# Patient Record
Sex: Female | Born: 1955 | Race: White | Hispanic: No | Marital: Married | Smoking: Current some day smoker
Health system: Southern US, Community
[De-identification: ages and names within clinical notes are randomized; demographics above are authoritative.]

## PROBLEM LIST (undated history)

## (undated) DIAGNOSIS — J189 Pneumonia, unspecified organism: Secondary | ICD-10-CM

## (undated) DIAGNOSIS — F329 Major depressive disorder, single episode, unspecified: Secondary | ICD-10-CM

## (undated) DIAGNOSIS — F32A Depression, unspecified: Secondary | ICD-10-CM

## (undated) DIAGNOSIS — I10 Essential (primary) hypertension: Secondary | ICD-10-CM

## (undated) DIAGNOSIS — T8859XA Other complications of anesthesia, initial encounter: Secondary | ICD-10-CM

## (undated) DIAGNOSIS — R112 Nausea with vomiting, unspecified: Secondary | ICD-10-CM

## (undated) DIAGNOSIS — T4145XA Adverse effect of unspecified anesthetic, initial encounter: Secondary | ICD-10-CM

## (undated) DIAGNOSIS — Z9889 Other specified postprocedural states: Secondary | ICD-10-CM

## (undated) DIAGNOSIS — E785 Hyperlipidemia, unspecified: Secondary | ICD-10-CM

## (undated) DIAGNOSIS — M1990 Unspecified osteoarthritis, unspecified site: Secondary | ICD-10-CM

## (undated) DIAGNOSIS — G4700 Insomnia, unspecified: Secondary | ICD-10-CM

## (undated) HISTORY — DX: Essential (primary) hypertension: I10

## (undated) HISTORY — DX: Major depressive disorder, single episode, unspecified: F32.9

## (undated) HISTORY — PX: TONSILLECTOMY: SUR1361

## (undated) HISTORY — DX: Unspecified osteoarthritis, unspecified site: M19.90

## (undated) HISTORY — PX: MYRINGOTOMY: SUR874

## (undated) HISTORY — DX: Hyperlipidemia, unspecified: E78.5

## (undated) HISTORY — DX: Insomnia, unspecified: G47.00

## (undated) HISTORY — DX: Depression, unspecified: F32.A

---

## 2001-08-18 ENCOUNTER — Other Ambulatory Visit: Admission: RE | Admit: 2001-08-18 | Discharge: 2001-08-18 | Payer: Self-pay | Admitting: Internal Medicine

## 2002-07-22 ENCOUNTER — Other Ambulatory Visit: Admission: RE | Admit: 2002-07-22 | Discharge: 2002-07-22 | Payer: Self-pay | Admitting: Internal Medicine

## 2003-08-26 ENCOUNTER — Other Ambulatory Visit: Admission: RE | Admit: 2003-08-26 | Discharge: 2003-08-26 | Payer: Self-pay | Admitting: Internal Medicine

## 2005-04-16 ENCOUNTER — Ambulatory Visit: Payer: Self-pay | Admitting: Internal Medicine

## 2005-04-26 ENCOUNTER — Ambulatory Visit: Payer: Self-pay | Admitting: Internal Medicine

## 2005-05-10 ENCOUNTER — Ambulatory Visit: Payer: Self-pay | Admitting: Internal Medicine

## 2005-09-05 ENCOUNTER — Ambulatory Visit: Payer: Self-pay | Admitting: Internal Medicine

## 2005-10-23 ENCOUNTER — Ambulatory Visit: Payer: Self-pay | Admitting: Internal Medicine

## 2005-12-10 ENCOUNTER — Other Ambulatory Visit: Admission: RE | Admit: 2005-12-10 | Discharge: 2005-12-10 | Payer: Self-pay | Admitting: Internal Medicine

## 2005-12-10 ENCOUNTER — Ambulatory Visit: Payer: Self-pay | Admitting: Internal Medicine

## 2005-12-10 ENCOUNTER — Encounter: Payer: Self-pay | Admitting: Internal Medicine

## 2007-05-05 ENCOUNTER — Ambulatory Visit: Payer: Self-pay | Admitting: Internal Medicine

## 2007-05-05 LAB — CONVERTED CEMR LAB
ALT: 21 units/L (ref 0–35)
AST: 23 units/L (ref 0–37)
Albumin: 4 g/dL (ref 3.5–5.2)
Alkaline Phosphatase: 73 units/L (ref 39–117)
BUN: 9 mg/dL (ref 6–23)
Basophils Absolute: 0 10*3/uL (ref 0.0–0.1)
Calcium: 9.1 mg/dL (ref 8.4–10.5)
Chloride: 105 meq/L (ref 96–112)
Direct LDL: 95.5 mg/dL
Eosinophils Absolute: 0.2 10*3/uL (ref 0.0–0.6)
GFR calc non Af Amer: 113 mL/min
HDL: 36.8 mg/dL — ABNORMAL LOW (ref >39.0)
MCHC: 34.3 g/dL (ref 30.0–36.0)
MCV: 87.4 fL (ref 78.0–100.0)
Platelets: 336 10*3/uL (ref 150–400)
RBC: 4.58 M/uL (ref 3.87–5.11)
Total CHOL/HDL Ratio: 4
Triglycerides: 262 mg/dL (ref 0–149)

## 2007-05-12 ENCOUNTER — Encounter: Payer: Self-pay | Admitting: Internal Medicine

## 2007-05-12 ENCOUNTER — Other Ambulatory Visit: Admission: RE | Admit: 2007-05-12 | Discharge: 2007-05-12 | Payer: Self-pay | Admitting: Internal Medicine

## 2007-05-12 ENCOUNTER — Ambulatory Visit: Payer: Self-pay | Admitting: Internal Medicine

## 2007-05-12 DIAGNOSIS — I10 Essential (primary) hypertension: Secondary | ICD-10-CM | POA: Insufficient documentation

## 2007-05-12 DIAGNOSIS — B009 Herpesviral infection, unspecified: Secondary | ICD-10-CM | POA: Insufficient documentation

## 2007-05-12 DIAGNOSIS — E785 Hyperlipidemia, unspecified: Secondary | ICD-10-CM | POA: Insufficient documentation

## 2007-05-12 DIAGNOSIS — I7389 Other specified peripheral vascular diseases: Secondary | ICD-10-CM | POA: Insufficient documentation

## 2007-05-15 ENCOUNTER — Telehealth: Payer: Self-pay | Admitting: *Deleted

## 2007-05-26 ENCOUNTER — Telehealth: Payer: Self-pay | Admitting: Internal Medicine

## 2007-06-17 ENCOUNTER — Telehealth: Payer: Self-pay | Admitting: *Deleted

## 2007-07-07 ENCOUNTER — Ambulatory Visit: Payer: Self-pay | Admitting: Internal Medicine

## 2007-07-07 LAB — CONVERTED CEMR LAB
HDL goal, serum: 40 mg/dL
LDL Goal: 100 mg/dL

## 2007-07-08 ENCOUNTER — Encounter: Payer: Self-pay | Admitting: Internal Medicine

## 2007-07-08 ENCOUNTER — Ambulatory Visit: Payer: Self-pay | Admitting: Internal Medicine

## 2007-11-03 ENCOUNTER — Ambulatory Visit: Payer: Self-pay | Admitting: Internal Medicine

## 2008-01-09 ENCOUNTER — Ambulatory Visit: Payer: Self-pay | Admitting: Internal Medicine

## 2008-01-09 LAB — CONVERTED CEMR LAB
Alkaline Phosphatase: 75 units/L (ref 39–117)
HDL: 32.3 mg/dL — ABNORMAL LOW (ref >39.0)
Total Bilirubin: 0.7 mg/dL (ref 0.3–1.2)
Total Protein: 6.5 g/dL (ref 6.0–8.3)
Triglycerides: 239 mg/dL (ref 0–149)

## 2008-01-16 ENCOUNTER — Ambulatory Visit: Payer: Self-pay | Admitting: Internal Medicine

## 2008-01-16 ENCOUNTER — Encounter: Payer: Self-pay | Admitting: Internal Medicine

## 2008-01-16 DIAGNOSIS — M1990 Unspecified osteoarthritis, unspecified site: Secondary | ICD-10-CM | POA: Insufficient documentation

## 2008-05-07 ENCOUNTER — Ambulatory Visit: Payer: Self-pay | Admitting: Internal Medicine

## 2008-05-07 LAB — CONVERTED CEMR LAB
ALT: 23 units/L (ref 0–35)
AST: 24 units/L (ref 0–37)
Basophils Relative: 0.9 % (ref 0.0–3.0)
CO2: 25 meq/L (ref 19–32)
Calcium: 8.9 mg/dL (ref 8.4–10.5)
Creatinine, Ser: 0.6 mg/dL (ref 0.4–1.2)
Direct LDL: 95 mg/dL
Eosinophils Relative: 2.6 % (ref 0.0–5.0)
Glucose, Bld: 113 mg/dL — ABNORMAL HIGH (ref 70–99)
Glucose, Urine, Semiquant: NEGATIVE
Hemoglobin: 13.6 g/dL (ref 12.0–15.0)
Lymphocytes Relative: 34.3 % (ref 12.0–46.0)
Monocytes Relative: 5.8 % (ref 3.0–12.0)
Neutro Abs: 4.1 10*3/uL (ref 1.4–7.7)
Protein, U semiquant: NEGATIVE
RBC: 4.54 M/uL (ref 3.87–5.11)
TSH: 2.97 microintl units/mL (ref 0.35–5.50)
Total CHOL/HDL Ratio: 4.1
Total Protein: 6.8 g/dL (ref 6.0–8.3)
Urobilinogen, UA: 0.2
VLDL: 41 mg/dL — ABNORMAL HIGH (ref 0–40)
WBC Urine, dipstick: NEGATIVE
WBC: 7.3 10*3/uL (ref 4.5–10.5)
pH: 7

## 2008-05-14 ENCOUNTER — Other Ambulatory Visit: Admission: RE | Admit: 2008-05-14 | Discharge: 2008-05-14 | Payer: Self-pay | Admitting: Internal Medicine

## 2008-05-14 ENCOUNTER — Encounter: Payer: Self-pay | Admitting: Internal Medicine

## 2008-05-14 ENCOUNTER — Ambulatory Visit: Payer: Self-pay | Admitting: Internal Medicine

## 2008-12-15 ENCOUNTER — Ambulatory Visit: Payer: Self-pay | Admitting: Internal Medicine

## 2008-12-15 ENCOUNTER — Telehealth: Payer: Self-pay | Admitting: Internal Medicine

## 2008-12-15 DIAGNOSIS — M25569 Pain in unspecified knee: Secondary | ICD-10-CM | POA: Insufficient documentation

## 2009-03-04 ENCOUNTER — Encounter: Admission: RE | Admit: 2009-03-04 | Discharge: 2009-03-04 | Payer: Self-pay | Admitting: Orthopedic Surgery

## 2009-05-02 ENCOUNTER — Telehealth: Payer: Self-pay | Admitting: Internal Medicine

## 2009-06-08 ENCOUNTER — Ambulatory Visit: Payer: Self-pay | Admitting: Internal Medicine

## 2009-06-08 LAB — CONVERTED CEMR LAB
ALT: 23 units/L (ref 0–35)
AST: 24 units/L (ref 0–37)
Albumin: 4 g/dL (ref 3.5–5.2)
Alkaline Phosphatase: 72 units/L (ref 39–117)
Basophils Relative: 0.9 % (ref 0.0–3.0)
Bilirubin, Direct: 0.1 mg/dL (ref 0.0–0.3)
CO2: 28 meq/L (ref 19–32)
Calcium: 9 mg/dL (ref 8.4–10.5)
Creatinine, Ser: 0.6 mg/dL (ref 0.4–1.2)
Eosinophils Relative: 2.4 % (ref 0.0–5.0)
HDL: 39.7 mg/dL (ref >39.00)
Hemoglobin: 13.6 g/dL (ref 12.0–15.0)
LDL Cholesterol: 91 mg/dL (ref 0–99)
Lymphocytes Relative: 36.5 % (ref 12.0–46.0)
MCHC: 33.4 g/dL (ref 30.0–36.0)
Monocytes Relative: 4.9 % (ref 3.0–12.0)
Neutro Abs: 3.8 10*3/uL (ref 1.4–7.7)
Neutrophils Relative %: 55.3 % (ref 43.0–77.0)
Nitrite: NEGATIVE
RBC: 4.6 M/uL (ref 3.87–5.11)
Sodium: 145 meq/L (ref 135–145)
Specific Gravity, Urine: 1.02
Total CHOL/HDL Ratio: 4
Total Protein: 7.2 g/dL (ref 6.0–8.3)
Urobilinogen, UA: 0.2
WBC Urine, dipstick: NEGATIVE
WBC: 7 10*3/uL (ref 4.5–10.5)

## 2009-06-15 ENCOUNTER — Ambulatory Visit: Payer: Self-pay | Admitting: Internal Medicine

## 2009-06-15 ENCOUNTER — Other Ambulatory Visit: Admission: RE | Admit: 2009-06-15 | Discharge: 2009-06-15 | Payer: Self-pay | Admitting: Internal Medicine

## 2009-06-15 ENCOUNTER — Encounter: Payer: Self-pay | Admitting: Internal Medicine

## 2009-08-05 ENCOUNTER — Encounter: Payer: Self-pay | Admitting: Internal Medicine

## 2009-08-05 ENCOUNTER — Ambulatory Visit: Payer: Self-pay | Admitting: Internal Medicine

## 2009-08-12 ENCOUNTER — Ambulatory Visit: Payer: Self-pay | Admitting: Internal Medicine

## 2009-10-27 ENCOUNTER — Ambulatory Visit: Payer: Self-pay | Admitting: Internal Medicine

## 2009-10-28 ENCOUNTER — Telehealth: Payer: Self-pay | Admitting: Internal Medicine

## 2009-10-31 ENCOUNTER — Telehealth: Payer: Self-pay | Admitting: Internal Medicine

## 2009-11-15 ENCOUNTER — Ambulatory Visit: Payer: Self-pay | Admitting: Internal Medicine

## 2009-11-15 DIAGNOSIS — M1710 Unilateral primary osteoarthritis, unspecified knee: Secondary | ICD-10-CM | POA: Insufficient documentation

## 2009-11-16 ENCOUNTER — Telehealth: Payer: Self-pay | Admitting: Internal Medicine

## 2009-12-14 ENCOUNTER — Ambulatory Visit: Payer: Self-pay | Admitting: Internal Medicine

## 2009-12-21 ENCOUNTER — Ambulatory Visit: Payer: Self-pay | Admitting: Internal Medicine

## 2009-12-28 ENCOUNTER — Ambulatory Visit: Payer: Self-pay | Admitting: Internal Medicine

## 2010-03-22 ENCOUNTER — Telehealth: Payer: Self-pay | Admitting: Internal Medicine

## 2010-11-23 NOTE — Assessment & Plan Note (Signed)
Summary: 3rd synvisc injection/bmw   Vital Signs:  Patient profile:   55 year old female Temp:     98.2 degrees F oral Pulse rate:   72 / minute BP sitting:   130 / 80  (left arm)  Vitals Entered By: Willy Eddy, LPN (December 28, 452 4:11 PM) CC: last synvisc injection lot t1004/exp 02-2012   CC:  last synvisc injection lot t1004/exp 02-2012.  History of Present Illness: presents for thrid shot, has noted improvement  Preventive Screening-Counseling & Management  Alcohol-Tobacco     Smoking Status: quit     Passive Smoke Exposure: no  Current Medications (verified): 1)  Simvastatin 20 Mg Tabs (Simvastatin) .... One By Mouth Daily 2)  Vitamin C .... Take 1 Tablet By Mouth Once A Day 3)  Remifemin 20 Mg  Tabs (Black Cohosh) .Marland Kitchen.. 1 Twice A Day 4)  Co Q-10 Vitamin E Fish Oil 60-90-25-200  Caps (Dha-Epa-Coenzyme Q10-Vitamin E) 5)  Fish Oil Concentrate 1000 Mg  Caps (Omega-3 Fatty Acids) .... Two By Mouth Bid 6)  Floxin Otic 0.3 %  Soln (Ofloxacin) .... 4qtts in Right Ear Bid 7)  Valacyclovir Hcl 500 Mg Tabs (Valacyclovir Hcl) .Marland Kitchen.. 1 Once Daily As Needed 8)  Tramadol Hcl 50 Mg Tabs (Tramadol Hcl) .... One To Two By Mouth Q 6 Hours As Needed Pain 9)  Vitamin B-6 100 Mg Tabs (Pyridoxine Hcl) .Marland Kitchen.. 1 Once Daily 10)  Folic Acid 1 Mg Tabs (Folic Acid) .Marland Kitchen.. 1 Once Daily 11)  Sulfamethoxazole-Trimethoprim 800-160 Mg/65ml Susp (Sulfamethoxazole-Trimethoprim) .... One By Mouth Two Times A Day 12)  Diclofenac Sodium 75 Mg Tbec (Diclofenac Sodium) .... One By Mouth Bid  Allergies (verified): 1)  ! Augmentin   Impression & Recommendations:  Problem # 1:  LOC OSTEOARTHROS NOT SPEC PRIM/SEC LOWER LEG (ICD-715.36) right knee pt was prepped in a sterile manor and 2cc of synvisc was injected into the knee.The pt tolerated the procedure well and was given post procedure instructions.  Her updated medication list for this problem includes:    Tramadol Hcl 50 Mg Tabs (Tramadol hcl) .....  One to two by mouth q 6 hours as needed pain    Diclofenac Sodium 75 Mg Tbec (Diclofenac sodium) ..... One by mouth bid  Discussed use of medications, application of heat or cold, and exercises.   Orders: No Charge Patient Arrived (NCPA0) (NCPA0) Joint Aspirate / Injection, Large (20610) Synvisc injection, 2 ml (U9811)  Complete Medication List: 1)  Simvastatin 20 Mg Tabs (Simvastatin) .... One by mouth daily 2)  Vitamin C  .... Take 1 tablet by mouth once a day 3)  Remifemin 20 Mg Tabs (Black cohosh) .Marland Kitchen.. 1 twice a day 4)  Co Q-10 Vitamin E Fish Oil 60-90-25-200 Caps (Dha-epa-coenzyme q10-vitamin e) 5)  Fish Oil Concentrate 1000 Mg Caps (Omega-3 fatty acids) .... Two by mouth bid 6)  Floxin Otic 0.3 % Soln (Ofloxacin) .... 4qtts in right ear bid 7)  Valacyclovir Hcl 500 Mg Tabs (Valacyclovir hcl) .Marland Kitchen.. 1 once daily as needed 8)  Tramadol Hcl 50 Mg Tabs (Tramadol hcl) .... One to two by mouth q 6 hours as needed pain 9)  Vitamin B-6 100 Mg Tabs (Pyridoxine hcl) .Marland Kitchen.. 1 once daily 10)  Folic Acid 1 Mg Tabs (Folic acid) .Marland Kitchen.. 1 once daily 11)  Sulfamethoxazole-trimethoprim 800-160 Mg/52ml Susp (Sulfamethoxazole-trimethoprim) .... One by mouth two times a day 12)  Diclofenac Sodium 75 Mg Tbec (Diclofenac sodium) .... One by mouth bid

## 2010-11-23 NOTE — Progress Notes (Signed)
Summary: Pt called to update Dr. Lovell Sheehan on pain  Phone Note Call from Patient Call back at Home Phone [phone]   Caller: Patient Summary of Call: Pt called and wanted to let Dr. Lovell Sheehan that the cortisone shot she was given yesterday, seems to be working. Pt feeling some pain but not as bad. Pain lvl is now a 2.   Initial call taken by: Lucy Antigua,  November 16, 2009 2:56 PM  Follow-up for Phone Call        dr Lovell Sheehan is aware Follow-up by: Willy Eddy, LPN,  November 16, 2009 3:12 PM

## 2010-11-23 NOTE — Progress Notes (Signed)
Summary: diclofenac refill  Phone Note Refill Request Message from:  Fax from Pharmacy on March 22, 2010 3:26 PM  Refills Requested: Medication #1:  DICLOFENAC SODIUM 75 MG TBEC one by mouth bid. Initial call taken by: Kern Reap CMA Duncan Dull),  March 22, 2010 3:26 PM    Prescriptions: DICLOFENAC SODIUM 75 MG TBEC (DICLOFENAC SODIUM) one by mouth bid  #60 Tablet x 0   Entered by:   Kern Reap CMA (AAMA)   Authorized by:   Stacie Glaze MD   Signed by:   Kern Reap CMA (AAMA) on 03/22/2010   Method used:   Electronically to        [facility] (retail)       [address]       [place], [place]  [address]       Ph: [phone] or [phone]       Fax: [phone]   RxID:   [id number]

## 2010-11-23 NOTE — Assessment & Plan Note (Signed)
Summary: consult re: birth mark/notices changes/cjr/husband rescd from...   Vital Signs:  Patient profile:   55 year old female Height:      63 inches Weight:      238 pounds BMI:     42.31 Temp:     98.2 degrees F oral Pulse rate:   84 / minute Resp:     14 per minute BP sitting:   130 / 84  (left arm) Cuff size:   large  Vitals Entered By: Willy Eddy, LPN (October 27, 2009 1:50 PM) CC: c/o cough and congestion- , Hypertension Management, Cough, Lipid Management   CC:  c/o cough and congestion- , Hypertension Management, Cough, and Lipid Management.  History of Present Illness:  Cough      This is a 55 year old woman who presents with Cough.  The patient reports productive cough and wheezing, but denies non-productive cough, pleuritic chest pain, shortness of breath, exertional dyspnea, fever, hemoptysis, and malaise.  The patient denies the following symptoms: cold/URI symptoms, sore throat, nasal congestion, chronic rhinitis, weight loss, acid reflux symptoms, and peripheral edema.  The cough is worse with cold exposure.  Risk factors include recurrent sinus infections.  the cough is productive of green sputum. she has mild pluretic chest pain.   Hypertension History:      She denies headache, chest pain, palpitations, dyspnea with exertion, orthopnea, PND, peripheral edema, visual symptoms, neurologic problems, syncope, and side effects from treatment.        Positive major cardiovascular risk factors include hyperlipidemia and hypertension.  Negative major cardiovascular risk factors include female age less than 30 years old, negative family history for ischemic heart disease, and non-tobacco-user status.        Positive history for target organ damage include peripheral vascular disease.  Further assessment for target organ damage reveals no history of ASHD or stroke/TIA.    Lipid Management History:      Positive NCEP/ATP III risk factors include early menopause without  estrogen hormone replacement, HDL cholesterol less than 40, hypertension, and peripheral vascular disease.  Negative NCEP/ATP III risk factors include female age less than 45 years old, no family history for ischemic heart disease, non-tobacco-user status, no ASHD (atherosclerotic heart disease), no prior stroke/TIA, and no history of aortic aneurysm.     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 One By Mouth Daily 2)  Vitamin C .... Take 1 Tablet By Mouth Once A Day 3)  Remifemin 20 Mg  Tabs (Black Cohosh) .Marland Kitchen.. 1 Twice A Day 4)  Co Q-10 Vitamin E Fish Oil 60-90-25-200  Caps (Dha-Epa-Coenzyme Q10-Vitamin E) 5)  Fish Oil Concentrate 1000 Mg  Caps (Omega-3 Fatty Acids) .... Two By Mouth Bid 6)  Floxin Otic 0.3 %  Soln (Ofloxacin) .... 4qtts in Right Ear Bid 7)  Valacyclovir Hcl 500 Mg Tabs (Valacyclovir Hcl) .Marland Kitchen.. 1 Once Daily As Needed 8)  Meloxicam 15 Mg Tabs (  Meloxicam) .... One By Mouth Daily 9)  Tramadol Hcl 50 Mg Tabs (Tramadol Hcl) .... One To Two By Mouth Q 6 Hours As Needed Pain 10)  Vitamin B-6 100 Mg Tabs (Pyridoxine Hcl) .Marland Kitchen.. 1 Once Daily 11)  Folic Acid 1 Mg Tabs (Folic Acid) .Marland Kitchen.. 1 Once Daily  Current Medications (verified): 1)  Simvastatin 20 Mg Tabs (Simvastatin) .... One By Mouth Daily 2)  Vitamin C .... Take 1 Tablet By Mouth Once A Day 3)  Remifemin 20 Mg  Tabs (Black Cohosh) .Marland Kitchen.. 1 Twice A Day 4)  Co Q-10 Vitamin E Fish Oil 60-90-25-200   Caps (Dha-Epa-Coenzyme Q10-Vitamin E) 5)  Fish Oil Concentrate 1000 Mg  Caps (Omega-3 Fatty Acids) .... Two By Mouth Bid 6)  Floxin Otic 0.3 %  Soln (Ofloxacin) .... 4qtts in Right Ear Bid 7)  Valacyclovir Hcl 500 Mg Tabs (Valacyclovir Hcl) .Marland Kitchen.. 1 Once Daily As Needed 8)  Meloxicam 15 Mg Tabs (Meloxicam) .... One By Mouth Daily 9)  Tramadol Hcl 50 Mg Tabs (Tramadol Hcl) .... One To Two By Mouth Q 6 Hours As Needed Pain 10)  Vitamin B-6 100 Mg Tabs (Pyridoxine Hcl) .Marland Kitchen.. 1 Once Daily 11)  Folic Acid 1 Mg Tabs (Folic Acid) .Marland Kitchen.. 1 Once Daily 12)  Robafen Ac 100-10 Mg/27ml Syrp (Guaifenesin-Codeine) .... Two Tps By Mouth Q 6 Hours Prn  Allergies (verified): 1)  ! Augmentin  Past History:  Family History: Last updated: 05/12/2007 uncl with Altzhimers Dz  Social History: Last updated: 05/12/2007 Occupation: unemployed Married Former Smoker quit 2004  Risk Factors: Smoking Status: quit (10/27/2009) Passive Smoke Exposure: no (10/27/2009)  Past medical, surgical, family and social histories (including risk factors) reviewed, and no changes noted (except as noted below).  Past Medical History: Reviewed history from 01/16/2008 and no changes required. Hyperlipidemia Hypertension Osteoarthritis  Past Surgical History: Reviewed history from 05/12/2007 and no changes required. Tonsillectomy Miringotomy  Family History: Reviewed history from 05/12/2007 and no changes required. uncl with Altzhimers Dz  Social History: Reviewed history from 05/12/2007 and no changes required. Occupation: unemployed Married Former Smoker quit 2004  Review of Systems       The patient complains of decreased hearing, hoarseness, prolonged cough, and headaches.  The patient denies anorexia, fever, weight loss, weight gain, vision loss, chest pain, syncope, dyspnea on exertion, peripheral edema, hemoptysis, abdominal pain, melena, hematochezia, severe indigestion/heartburn, hematuria, incontinence,  genital sores, muscle weakness, suspicious skin lesions, transient blindness, difficulty walking, depression, unusual weight change, abnormal bleeding, enlarged lymph nodes, angioedema, and breast masses.    Physical Exam  General:  alert.  overweight-appearing.   Head:  normocephalic.  atraumatic.   Ears:  R Canal drainage, R TM erythema, R TM bulging, and R TM perforated.   Nose:  no external deformity and no airflow obstruction.   Mouth:  good dentition and pharynx pink and moist.   Neck:  No deformities, masses, or tenderness noted. Lungs:  Normal respiratory effort, chest expands symmetrically. Lungs are clear to auscultation, no crackles or wheezes. Heart:  normal rate and regular rhythm.   Abdomen:  soft and non-tender.   Genitalia:  normal introitus, no vaginal atrophy, normal uterus size and position, and no adnexal masses or tenderness.   Msk:  decreased ROM and joint tenderness.   Pulses:  R and L carotid,radial,femoral,dorsalis pedis and posterior tibial pulses are full and equal bilaterally Extremities:  trace left pedal edema and trace right pedal edema.   Neurologic:  cranial nerves II-XII intact  and gait normal.     Impression & Recommendations:  Problem # 1:  ACUTE BRONCHITIS (ICD-466.0) Assessment Unchanged  Her updated medication list for this problem includes:    Robafen Ac 100-10 Mg/50ml Syrp (Guaifenesin-codeine) .Marland Kitchen..Marland Kitchen Two tps by mouth q 6 hours prn    Sulfamethoxazole-trimethoprim 800-160 Mg/86ml Susp (Sulfamethoxazole-trimethoprim) ..... One by mouth two times a day  Take antibiotics and other medications as directed. Encouraged to push clear liquids, get enough rest, and take acetaminophen as needed. To be seen in 5-7 days if no improvement, sooner if worse.  Problem # 2:  KNEE PAIN, LEFT (ICD-719.46) Assessment: Unchanged  Her updated medication list for this problem includes:    Celebrex 200 Mg Caps (Celecoxib) ..... One by mouth two times a day     Tramadol Hcl 50 Mg Tabs (Tramadol hcl) ..... One to two by mouth q 6 hours as needed pain  Discussed strengthening exercises, use of ice or heat, and medications.   Complete Medication List: 1)  Simvastatin 20 Mg Tabs (Simvastatin) .... One by mouth daily 2)  Vitamin C  .... Take 1 tablet by mouth once a day 3)  Remifemin 20 Mg Tabs (Black cohosh) .Marland Kitchen.. 1 twice a day 4)  Co Q-10 Vitamin E Fish Oil 60-90-25-200 Caps (Dha-epa-coenzyme q10-vitamin e) 5)  Fish Oil Concentrate 1000 Mg Caps (Omega-3 fatty acids) .... Two by mouth bid 6)  Floxin Otic 0.3 % Soln (Ofloxacin) .... 4qtts in right ear bid 7)  Valacyclovir Hcl 500 Mg Tabs (Valacyclovir hcl) .Marland Kitchen.. 1 once daily as needed 8)  Celebrex 200 Mg Caps (Celecoxib) .... One by mouth two times a day 9)  Tramadol Hcl 50 Mg Tabs (Tramadol hcl) .... One to two by mouth q 6 hours as needed pain 10)  Vitamin B-6 100 Mg Tabs (Pyridoxine hcl) .Marland Kitchen.. 1 once daily 11)  Folic Acid 1 Mg Tabs (Folic acid) .Marland Kitchen.. 1 once daily 12)  Robafen Ac 100-10 Mg/35ml Syrp (Guaifenesin-codeine) .... Two tps by mouth q 6 hours prn 13)  Sulfamethoxazole-trimethoprim 800-160 Mg/100ml Susp (Sulfamethoxazole-trimethoprim) .... One by mouth two times a day  Hypertension Assessment/Plan:      The patient's hypertensive risk group is category C: Target organ damage and/or diabetes.  Her calculated 10 year risk of coronary heart disease is 7 %.  Today's blood pressure is 130/84.  Her blood pressure goal is < 140/90.  Lipid Assessment/Plan:      Based on NCEP/ATP III, the patient's risk factor category is "history of coronary disease, peripheral vascular disease, cerebrovascular disease, or aortic aneurysm".  The patient's lipid goals are as follows: Total cholesterol goal is 200; LDL cholesterol goal is 100; HDL cholesterol goal is 40; Triglyceride goal is 150.  Her LDL cholesterol goal has been met.     Patient Instructions: 1)  Please schedule a follow-up appointment in 3  months. Prescriptions: CELEBREX 200 MG CAPS (CELECOXIB) one by mouth two times a day  #60 x 3   Entered and Authorized by:   Stacie Glaze MD   Signed by:   Stacie Glaze MD on 10/27/2009   Method used:   Electronically to        [facility] (retail)       [address]       [place], [place]  [address]       Ph: [phone] or [phone]       Fax: [phone]  RxID:   [id number] SULFAMETHOXAZOLE-TRIMETHOPRIM 800-160 MG/20ML SUSP (SULFAMETHOXAZOLE-TRIMETHOPRIM) one by mouth two times a day  #20 x 0   Entered and Authorized by:   Stacie Glaze MD   Signed by:   Stacie Glaze MD on 10/27/2009   Method used:   Electronically to        [facility] (retail)       [address]       [place], [place]  [address]       Ph: [phone] or [phone]       Fax: [phone]   RxID:   [id number] SIMVASTATIN 20 MG TABS (SIMVASTATIN) one by mouth daily  #90 x 3   Entered and Authorized by:   Stacie Glaze MD   Signed by:   Stacie Glaze MD on 10/27/2009   Method used:   Print then Give to Patient   RxID:   [id number] ROBAFEN AC 100-10 MG/5ML SYRP (GUAIFENESIN-CODEINE) two tps by mouth q 6 hours prn  #6 oz x 1   Entered and Authorized by:   Stacie Glaze MD   Signed by:   Stacie Glaze MD on 10/27/2009   Method used:   Print then Give to Patient   RxID:   [id number] ZOCOR 20 MG  TABS (SIMVASTATIN) one by mouth daily  #90 x 3   Entered by:   Willy Eddy, LPN   Authorized by:   Stacie Glaze MD   Signed by:   Willy Eddy, LPN on 54/27/0623   Method used:   Electronically to        [facility] (retail)       [address]       [place], [place]  [address]       Ph: [phone] or [phone]       Fax: [phone]   RxID:   [id number]

## 2010-11-23 NOTE — Progress Notes (Signed)
Summary: wants alternative  Phone Note Call from Patient Call back at Work Phone [phone]   Caller: Patient-live call Summary of Call: Ins co will not cover Celebrex. Could Volteran be called in to [facility] at [facility]? Wants bonnye to return her call. Initial call taken by: Warnell Forester,  October 31, 2009 9:10 AM  Follow-up for Phone Call        per dr Lovell Sheehan- ok to change to voltaren 75 #60 1 two times a day 1 refill Follow-up by: Willy Eddy, LPN,  October 31, 2009 10:26 AM    New/Updated Medications: VOLTAREN 0.1 % SOLN (DICLOFENAC SODIUM)  DICLOFENAC SODIUM 75 MG TBEC (DICLOFENAC SODIUM) one by mouth bid Prescriptions: DICLOFENAC SODIUM 75 MG TBEC (DICLOFENAC SODIUM) one by mouth bid  #60 x 0   Entered by:   Lynann Beaver CMA   Authorized by:   Stacie Glaze MD   Signed by:   Lynann Beaver CMA on 10/31/2009   Method used:   Electronically to        [facility] (retail)       [address]       [place], [place]  [address]       Ph: [phone] or [phone]       Fax: [phone]   RxID:   [id number]

## 2010-11-23 NOTE — Assessment & Plan Note (Signed)
Summary: R KNEE PAIN // RS   Vital Signs:  Patient profile:   55 year old female Height:      63 inches Weight:      238 pounds BMI:     42.31 Temp:     98.2 degrees F oral Pulse rate:   80 / minute Resp:     14 per minute BP sitting:   130 / 80  (left arm) Cuff size:   large  Vitals Entered By: Willy Eddy, LPN (November 15, 2009 3:04 PM) CC: rt knee pain   CC:  rt knee pain.  History of Present Illness: Knee pain with meniscal  injury knee pain for 6 months rated up tp 9/10 dull chronic pain with swelling right knee no injury history options of care discussed  Preventive Screening-Counseling & Management  Alcohol-Tobacco     Smoking Status: quit     Passive Smoke Exposure: no  Allergies: 1)  ! Augmentin  Physical Exam  Msk:  right knee withdecreased ROM, joint tenderness, and joint swelling.  and effusion Pulses:  R and L carotid,radial,femoral,dorsalis pedis and posterior tibial pulses are full and equal bilaterally Extremities:  trace left pedal edema and 1+ right pedal edema.     Impression & Recommendations:  Problem # 1:  LOC OSTEOARTHROS NOT SPEC PRIM/SEC LOWER LEG (ICD-715.36) ritghtr knee Informed consent obtained and then the joint was prepped in a sterile manor and 40 mg depo and 1/2 cc 1% lidocaine injected into the synovial space. After care discussed. Pt tolerated procedure well.  The following medications were removed from the medication list:    Celebrex 200 Mg Caps (Celecoxib) ..... One by mouth two times a day Her updated medication list for this problem includes:    Tramadol Hcl 50 Mg Tabs (Tramadol hcl) ..... One to two by mouth q 6 hours as needed pain    Diclofenac Sodium 75 Mg Tbec (Diclofenac sodium) ..... One by mouth bid  Discussed use of medications, application of heat or cold, and exercises.   Orders: No Charge Patient Arrived (NCPA0) (NCPA0) Joint Aspirate / Injection, Large (20610) Depo- Medrol 40mg  (J1030)  Complete  Medication List: 1)  Simvastatin 20 Mg Tabs (Simvastatin) .... One by mouth daily 2)  Vitamin C  .... Take 1 tablet by mouth once a day 3)  Remifemin 20 Mg Tabs (Black cohosh) .Marland Kitchen.. 1 twice a day 4)  Co Q-10 Vitamin E Fish Oil 60-90-25-200 Caps (Dha-epa-coenzyme q10-vitamin e) 5)  Fish Oil Concentrate 1000 Mg Caps (Omega-3 fatty acids) .... Two by mouth bid 6)  Floxin Otic 0.3 % Soln (Ofloxacin) .... 4qtts in right ear bid 7)  Valacyclovir Hcl 500 Mg Tabs (Valacyclovir hcl) .Marland Kitchen.. 1 once daily as needed 8)  Tramadol Hcl 50 Mg Tabs (Tramadol hcl) .... One to two by mouth q 6 hours as needed pain 9)  Vitamin B-6 100 Mg Tabs (Pyridoxine hcl) .Marland Kitchen.. 1 once daily 10)  Folic Acid 1 Mg Tabs (Folic acid) .Marland Kitchen.. 1 once daily 11)  Sulfamethoxazole-trimethoprim 800-160 Mg/1ml Susp (Sulfamethoxazole-trimethoprim) .... One by mouth two times a day 12)  Diclofenac Sodium 75 Mg Tbec (Diclofenac sodium) .... One by mouth bid

## 2010-11-23 NOTE — Progress Notes (Signed)
Summary: Pt has questions re: Celebrex. Insurance wont cover med  Phone Note Call from Patient Call back at Work Phone [phone]   Caller: Patient Reason for Call: Acute Illness Summary of Call: Pt has questions regarding Celebrex that was just prescribed. Pts insurance will not cover med and pharmacy will not fill. Please call pt before 3pm today.  Initial call taken by: Lucy Antigua,  October 28, 2009 10:37 AM  Follow-up for Phone Call        please let pt know the pharmacy needs to send [place] a prioir authroiazation form Follow-up by: Willy Eddy, LPN,  October 28, 2009 10:45 AM  Additional Follow-up for Phone Call Additional follow up Details #1::        Phone Call Completed Additional Follow-up by: Rudy Jew, RN,  October 28, 2009 10:54 AM

## 2010-11-23 NOTE — Assessment & Plan Note (Signed)
Summary: synvisc injection/bmw   Vital Signs:  Patient profile:   55 year old female Pulse rate:   80 / minute BP sitting:   130 / 80  (left arm) CC: 2nd synvisc injection lot t1004-exp 02/2012   CC:  2nd synvisc injection lot t1004-exp 02/2012.  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 One By Mouth Daily 2)  Vitamin C .... Take 1 Tablet By Mouth Once A Day 3)  Remifemin 20 Mg  Tabs (Black Cohosh) .Marland Kitchen.. 1 Twice A Day 4)  Co Q-10 Vitamin E Fish Oil 60-90-25-200  Caps (Dha-Epa-Coenzyme Q10-Vitamin E) 5)  Fish Oil Concentrate 1000 Mg  Caps (Omega-3 Fatty Acids) .... Two By Mouth Bid 6)  Floxin Otic 0.3 %  Soln (Ofloxacin) .... 4qtts in Right Ear Bid 7)  Valacyclovir Hcl 500 Mg Tabs (Valacyclovir Hcl) .Marland Kitchen.. 1 Once Daily As Needed 8)  Tramadol Hcl 50 Mg Tabs (Tramadol Hcl) .... One To Two By Mouth Q 6 Hours As Needed Pain 9)  Vitamin B-6 100 Mg Tabs (Pyridoxine Hcl) .Marland Kitchen.. 1 Once Daily 10)  Folic Acid 1 Mg Tabs (Folic Acid) .Marland Kitchen.. 1 Once Daily 11)  Sulfamethoxazole-Trimethoprim 800-160 Mg/61ml Susp (Sulfamethoxazole-Trimethoprim) .... One By Mouth Two Times A Day 12)  Diclofenac Sodium 75 Mg Tbec (Diclofenac Sodium) .... One By Mouth Bid  Allergies: 1)  !  Augmentin   Impression & Recommendations:  Problem # 1:  LOC OSTEOARTHROS NOT SPEC PRIM/SEC LOWER LEG (ICD-715.36)  pt was prepped in a sterile manor and 2cc of synvisc was injected into the knee.The pt tolerated the procedure well and was given post procedure instructions. right knee Her updated medication list for this problem includes:    Tramadol Hcl 50 Mg Tabs (Tramadol hcl) ..... One to two by mouth q 6 hours as needed pain    Diclofenac Sodium 75 Mg Tbec (Diclofenac sodium) ..... One by mouth bid  Discussed use of medications, application of heat or cold, and exercises.   Orders: No Charge Patient Arrived (NCPA0) (NCPA0) Depo- Medrol 40mg  (J1030) Synvisc injection, 2 ml (N5621)  Complete Medication List: 1)  Simvastatin 20 Mg Tabs (Simvastatin) .... One by mouth daily 2)  Vitamin C  .... Take 1 tablet by mouth once a day 3)  Remifemin 20 Mg Tabs (Black cohosh) .Marland Kitchen.. 1 twice a day 4)  Co Q-10 Vitamin E Fish Oil 60-90-25-200 Caps (Dha-epa-coenzyme q10-vitamin e) 5)  Fish Oil Concentrate 1000 Mg Caps (Omega-3 fatty acids) .... Two by mouth bid 6)  Floxin Otic 0.3 % Soln (Ofloxacin) .... 4qtts in right ear bid 7)  Valacyclovir Hcl 500 Mg Tabs (Valacyclovir hcl) .Marland Kitchen.. 1 once daily as needed 8)  Tramadol Hcl 50 Mg Tabs (Tramadol hcl) .... One to two by mouth q 6 hours as  needed pain 9)  Vitamin B-6 100 Mg Tabs (Pyridoxine hcl) .Marland Kitchen.. 1 once daily 10)  Folic Acid 1 Mg Tabs (Folic acid) .Marland Kitchen.. 1 once daily 11)  Sulfamethoxazole-trimethoprim 800-160 Mg/10ml Susp (Sulfamethoxazole-trimethoprim) .... One by mouth two times a day 12)  Diclofenac Sodium 75 Mg Tbec (Diclofenac sodium) .... One by mouth bid

## 2010-11-23 NOTE — Assessment & Plan Note (Signed)
Summary: CONSULT RE: KNEE PAIN/REQ INJ IN KNEE/CJR   Vital Signs:  Patient profile:   55 year old female Height:      63 inches Weight:      238 pounds BMI:     42.31 Temp:     98.2 degrees F oral Pulse rate:   80 / minute Resp:     14 per minute  Vitals Entered By: Willy Eddy, LPN (December 14, 2009 3:28 PM) CC: c/o rt knee pain-synvisc lot q1011  exp 3-13   CC:  c/o rt knee pain-synvisc lot q1011  exp 3-13.  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) 10)  Hypertension  (ICD-401.9) 11)  Hyperlipidemia  (ICD-272.4)  Current Problems (verified): 1)  Loc Osteoarthros Not Spec Prim/sec Lower Leg  (ICD-715.36) 2)  Acute Bronchitis  (ICD-466.0) 3)  Otitis Media, Right, With Rupture of Tympanic Membrane  (ICD-382.01) 4)  Knee Pain, Left  (ICD-719.46) 5)  Osteoarthritis  (ICD-715.90) 6)  Health Maintenance Exam  (ICD-V70.0) 7)  Otitis Media Nos  (ICD-382.9) 8)  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 One By Mouth Daily 2)  Vitamin C .... Take 1 Tablet By Mouth Once A Day 3)  Remifemin 20 Mg  Tabs (Black Cohosh) .Marland Kitchen.. 1 Twice A Day 4)  Co Q-10 Vitamin E Fish Oil 60-90-25-200  Caps (Dha-Epa-Coenzyme Q10-Vitamin E) 5)  Fish Oil  Concentrate 1000 Mg  Caps (Omega-3 Fatty Acids) .... Two By Mouth Bid 6)  Floxin Otic 0.3 %  Soln (Ofloxacin) .... 4qtts in Right Ear Bid 7)  Valacyclovir Hcl 500 Mg Tabs (Valacyclovir Hcl) .Marland Kitchen.. 1 Once Daily As Needed 8)  Tramadol Hcl 50 Mg Tabs (Tramadol Hcl) .... One To Two By Mouth Q 6 Hours As Needed Pain 9)  Vitamin B-6 100 Mg Tabs (Pyridoxine Hcl) .Marland Kitchen.. 1 Once Daily 10)  Folic Acid 1 Mg Tabs (Folic Acid) .Marland Kitchen.. 1 Once Daily 11)  Sulfamethoxazole-Trimethoprim 800-160 Mg/56ml Susp (Sulfamethoxazole-Trimethoprim) .... One By Mouth Two Times A Day 12)  Diclofenac Sodium 75 Mg Tbec (Diclofenac Sodium) .... One By Mouth Bid  Current Medications (verified): 1)  Simvastatin 20 Mg Tabs (Simvastatin) .... One By Mouth Daily 2)  Vitamin C .... Take 1 Tablet By Mouth Once A Day 3)  Remifemin 20 Mg  Tabs (Black Cohosh) .Marland Kitchen.. 1 Twice A Day 4)  Co Q-10 Vitamin E Fish Oil 60-90-25-200  Caps (Dha-Epa-Coenzyme Q10-Vitamin E) 5)  Fish Oil Concentrate 1000 Mg  Caps (Omega-3 Fatty Acids) .... Two By Mouth Bid 6)  Floxin Otic 0.3 %  Soln (Ofloxacin) .... 4qtts in Right Ear Bid 7)  Valacyclovir Hcl 500 Mg Tabs (Valacyclovir Hcl) .Marland Kitchen.. 1 Once Daily As Needed 8)  Tramadol Hcl 50 Mg Tabs (Tramadol Hcl) .Marland KitchenMarland KitchenMarland Kitchen  One To Two By Mouth Q 6 Hours As Needed Pain 9)  Vitamin B-6 100 Mg Tabs (Pyridoxine Hcl) .Marland Kitchen.. 1 Once Daily 10)  Folic Acid 1 Mg Tabs (Folic Acid) .Marland Kitchen.. 1 Once Daily 11)  Sulfamethoxazole-Trimethoprim 800-160 Mg/23ml Susp (Sulfamethoxazole-Trimethoprim) .... One By Mouth Two Times A Day 12)  Diclofenac Sodium 75 Mg Tbec (Diclofenac Sodium) .... One By Mouth Bid  Allergies (verified): 1)  ! Augmentin  Past History:  Family History: Last updated: 05/12/2007 uncl with Altzhimers Dz  Social History: Last updated: 05/12/2007 Occupation: unemployed Married Former Smoker quit 2004  Risk Factors: Smoking Status: quit (12/14/2009) Passive Smoke Exposure: no (12/14/2009)  Past medical, surgical, family and  social histories (including risk factors) reviewed, and no changes noted (except as noted below).  Past Medical History: Reviewed history from 01/16/2008 and no changes required. Hyperlipidemia Hypertension Osteoarthritis  Past Surgical History: Reviewed history from 05/12/2007 and no changes required. Tonsillectomy Miringotomy  Family History: Reviewed history from 05/12/2007 and no changes required. uncl with Altzhimers Dz  Social History: Reviewed history from 05/12/2007 and no changes required. Occupation: unemployed Married Former Smoker quit 2004  Review of Systems  The patient denies anorexia, fever, weight loss, weight gain, vision loss, decreased hearing, hoarseness, chest pain, syncope, dyspnea on exertion, peripheral edema, prolonged cough, headaches, hemoptysis, abdominal pain, melena, hematochezia, severe indigestion/heartburn, hematuria, incontinence, genital sores, muscle weakness, suspicious skin lesions, transient blindness, difficulty walking, depression, unusual weight change, abnormal bleeding, enlarged lymph nodes, angioedema, breast masses, and testicular masses.    Physical Exam  General:  alert.  overweight-appearing.   Head:  normocephalic.  atraumatic.   Eyes:  pupils equal and pupils round.   Neck:  No deformities, masses, or tenderness noted. Lungs:  Normal respiratory effort, chest expands symmetrically. Lungs are clear to auscultation, no crackles or wheezes. Heart:  normal rate and regular rhythm.   Msk:  decreased ROM, joint tenderness, and joint swelling.     Impression & Recommendations:  Problem # 1:  LOC OSTEOARTHROS NOT SPEC PRIM/SEC LOWER LEG (ICD-715.36) Assessment Improved  pt was prepped in a sterile manor and 2cc of synvisc was injected into the knee.The pt tolerated the procedure well and was given post procedure instructions.  Her updated medication list for this problem includes:    Tramadol Hcl 50 Mg Tabs (Tramadol hcl) .....  One to two by mouth q 6 hours as needed pain    Diclofenac Sodium 75 Mg Tbec (Diclofenac sodium) ..... One by mouth bid  Discussed use of medications, application of heat or cold, and exercises.   Orders: No Charge Patient Arrived (NCPA0) (NCPA0) Joint Aspirate / Injection, Large (20610) Synvisc injection, 2 ml (Z6109)  Complete Medication List: 1)  Simvastatin 20 Mg Tabs (Simvastatin) .... One by mouth daily 2)  Vitamin C  .... Take 1 tablet by mouth once a day 3)  Remifemin 20 Mg Tabs (Black cohosh) .Marland Kitchen.. 1 twice a day 4)  Co Q-10 Vitamin E Fish Oil 60-90-25-200 Caps (Dha-epa-coenzyme q10-vitamin e) 5)  Fish Oil Concentrate 1000 Mg Caps (Omega-3 fatty acids) .... Two by mouth bid 6)  Floxin Otic 0.3 % Soln (Ofloxacin) .... 4qtts in right ear bid 7)  Valacyclovir Hcl 500 Mg Tabs (Valacyclovir hcl) .Marland Kitchen.. 1 once daily as needed 8)  Tramadol Hcl 50 Mg Tabs (Tramadol hcl) .... One to two by mouth q 6 hours as needed pain 9)  Vitamin B-6 100 Mg Tabs (Pyridoxine hcl) .Marland Kitchen.. 1 once daily 10)  Folic Acid 1  Mg Tabs (Folic acid) .Marland Kitchen.. 1 once daily 11)  Sulfamethoxazole-trimethoprim 800-160 Mg/39ml Susp (Sulfamethoxazole-trimethoprim) .... One by mouth two times a day 12)  Diclofenac Sodium 75 Mg Tbec (Diclofenac sodium) .... One by mouth bid  Patient Instructions: 1)  return as last appointment the next two wednesdays

## 2011-01-03 ENCOUNTER — Other Ambulatory Visit (INDEPENDENT_AMBULATORY_CARE_PROVIDER_SITE_OTHER): Payer: Private Health Insurance - Indemnity | Admitting: Internal Medicine

## 2011-01-03 DIAGNOSIS — Z0000 Encounter for general adult medical examination without abnormal findings: Secondary | ICD-10-CM

## 2011-01-03 DIAGNOSIS — E785 Hyperlipidemia, unspecified: Secondary | ICD-10-CM

## 2011-01-03 LAB — LIPID PANEL
Cholesterol: 189 mg/dL (ref 0–200)
HDL: 46.7 mg/dL (ref >39.00)
Total CHOL/HDL Ratio: 4
Triglycerides: 306 mg/dL — ABNORMAL HIGH (ref 0.0–149.0)
VLDL: 61.2 mg/dL — ABNORMAL HIGH (ref 0.0–40.0)

## 2011-01-03 LAB — HEPATIC FUNCTION PANEL
Bilirubin, Direct: 0.1 mg/dL (ref 0.0–0.3)
Total Bilirubin: 0.4 mg/dL (ref 0.3–1.2)

## 2011-01-03 LAB — POCT URINALYSIS DIPSTICK
Glucose, UA: NEGATIVE
Protein, UA: NEGATIVE
Spec Grav, UA: 1.015
Urobilinogen, UA: 0.2
pH, UA: 7

## 2011-01-03 LAB — BASIC METABOLIC PANEL
BUN: 17 mg/dL (ref 6–23)
Chloride: 105 mEq/L (ref 96–112)
Creatinine, Ser: 0.6 mg/dL (ref 0.4–1.2)
Glucose, Bld: 110 mg/dL — ABNORMAL HIGH (ref 70–99)
Potassium: 4 mEq/L (ref 3.5–5.1)

## 2011-01-03 LAB — CBC WITH DIFFERENTIAL/PLATELET
Basophils Absolute: 0 10*3/uL (ref 0.0–0.1)
Eosinophils Absolute: 0.2 10*3/uL (ref 0.0–0.7)
HCT: 39.5 % (ref 36.0–46.0)
Lymphs Abs: 2.8 10*3/uL (ref 0.7–4.0)
MCV: 88.8 fl (ref 78.0–100.0)
Monocytes Absolute: 0.4 10*3/uL (ref 0.1–1.0)
Neutrophils Relative %: 56 % (ref 43.0–77.0)
Platelets: 311 10*3/uL (ref 150.0–400.0)
RDW: 14.7 % — ABNORMAL HIGH (ref 11.5–14.6)

## 2011-01-09 ENCOUNTER — Encounter: Payer: Self-pay | Admitting: Internal Medicine

## 2011-01-10 ENCOUNTER — Encounter: Payer: Self-pay | Admitting: Internal Medicine

## 2011-01-10 ENCOUNTER — Ambulatory Visit (INDEPENDENT_AMBULATORY_CARE_PROVIDER_SITE_OTHER): Payer: Private Health Insurance - Indemnity | Admitting: Internal Medicine

## 2011-01-10 ENCOUNTER — Telehealth: Payer: Self-pay | Admitting: Internal Medicine

## 2011-01-10 VITALS — BP 144/80 | HR 76 | Temp 98.2°F | Resp 16 | Ht 63.0 in | Wt 234.0 lb

## 2011-01-10 DIAGNOSIS — M25369 Other instability, unspecified knee: Secondary | ICD-10-CM | POA: Insufficient documentation

## 2011-01-10 DIAGNOSIS — H6090 Unspecified otitis externa, unspecified ear: Secondary | ICD-10-CM

## 2011-01-10 DIAGNOSIS — M25561 Pain in right knee: Secondary | ICD-10-CM

## 2011-01-10 DIAGNOSIS — M238X9 Other internal derangements of unspecified knee: Secondary | ICD-10-CM

## 2011-01-10 DIAGNOSIS — I10 Essential (primary) hypertension: Secondary | ICD-10-CM

## 2011-01-10 DIAGNOSIS — F4322 Adjustment disorder with anxiety: Secondary | ICD-10-CM

## 2011-01-10 DIAGNOSIS — Z0000 Encounter for general adult medical examination without abnormal findings: Secondary | ICD-10-CM

## 2011-01-10 DIAGNOSIS — M25562 Pain in left knee: Secondary | ICD-10-CM

## 2011-01-10 DIAGNOSIS — M25569 Pain in unspecified knee: Secondary | ICD-10-CM

## 2011-01-10 MED ORDER — SIMVASTATIN 20 MG PO TABS: 20.0000 mg | ORAL_TABLET | Freq: Every day | ORAL | Status: DC

## 2011-01-10 MED ORDER — VALACYCLOVIR HCL 500 MG PO TABS: 500.0000 mg | ORAL_TABLET | Freq: Every day | ORAL | Status: DC | PRN

## 2011-01-10 MED ORDER — OFLOXACIN 0.3 % OT SOLN: 5.0000 [drp] | Freq: Two times a day (BID) | OTIC | Status: DC

## 2011-01-10 MED ORDER — TAPENTADOL HCL 75 MG PO TABS: 1.0000 | ORAL_TABLET | Freq: Every day | ORAL | Status: AC

## 2011-01-10 MED ORDER — CITALOPRAM HYDROBROMIDE 20 MG PO TABS: 20.0000 mg | ORAL_TABLET | Freq: Every day | ORAL | Status: DC

## 2011-01-10 MED ORDER — DICLOFENAC SODIUM 75 MG PO TBEC: 75.0000 mg | DELAYED_RELEASE_TABLET | Freq: Two times a day (BID) | ORAL | Status: DC

## 2011-01-10 NOTE — Assessment & Plan Note (Signed)
Blood pressure control  is not optimized due to weight gain but we will continue her medications at this dose for the time being with a recommendation that she lose weight prior to surgery we have recommended an Optifast program for one month with 2 liquid meals and one solid female with a goal of losing 10-15 pounds in one month prior to surgery

## 2011-01-10 NOTE — Telephone Encounter (Signed)
Pt informed it was done

## 2011-01-10 NOTE — Assessment & Plan Note (Signed)
She is referred for total joint replacement we will control her pain with nycenta 75 mg by mouth daily

## 2011-01-10 NOTE — Assessment & Plan Note (Signed)
She has severe degenerative joint disease and will need a referral to Dr. Homero Fellers Allucio

## 2011-01-10 NOTE — Telephone Encounter (Signed)
Pt called and said that she did not rcv a script for antibiotic eardrop from Dr Lovell Sheehan when she was in for ov. Pt req this to be caled in to [facility] on [address].  Pls call pt when done.

## 2011-01-10 NOTE — Progress Notes (Signed)
Subjective:    Patient ID: Rose Rose, female    DOB: 1956/08/27, 55 y.o.   MRN: [id number]  HPI is a 55 year old white female who presents for a complete physical examination and for followup for hypertension and hyperlipidemia.   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   a year ago she had a course of Synvisc which helps temporarily but the pain has resulted in reducing her gait to limp with a cane and she will require knee replacement at this time.   She has increasing anxiety and depression over her inability to function and anxiety over the surgery in the future we discussed using a low dose antidepressant such as Celexa 20 mg today during this period.    Her blood pressure is moderately well controlled but the increased weight has resulted in her blood pressure being elevated 144/80. Her cholesterol is not well controlled for her risk factors do again to wait and dietary noncompliance.      Review of Systems  Constitutional: Positive for activity change, appetite change and fatigue.  HENT: Positive for congestion, neck stiffness and ear discharge. Negative for ear pain, neck pain, postnasal drip and sinus pressure.   Eyes: Negative for redness and visual disturbance.  Respiratory: Negative for cough, shortness of breath and wheezing.   Gastrointestinal: Negative for abdominal pain and abdominal distention.  Genitourinary: Negative for dysuria, frequency and menstrual problem.  Musculoskeletal: Negative for myalgias, joint swelling and arthralgias.       [  tenderness on her knees bilaterally with palpable effusions Skin: Negative.  Negative for rash and wound.  Neurological: Negative.  Negative for dizziness, weakness and headaches.  Hematological: Negative.  Negative for adenopathy. Does not bruise/bleed easily.  Psychiatric/Behavioral: Negative.  Negative for sleep disturbance and decreased concentration.    Past Medical History  Diagnosis Date  . Hyperlipidemia   . Hypertension   . Arthritis    Past Surgical History  Procedure Date  . Tonsillectomy   . Myringotomy     reports that she quit smoking about 8 years ago. She has never used smokeless tobacco. She reports that she does not drink alcohol or use illicit drugs. family history includes Alzheimer's disease in her maternal aunt; Arthritis in her mother; Dementia in her father; Parkinsonism in her father; and Vision loss in her mother. Allergies  Allergen Reactions  . JJO:ACZYSAYTKZS+WFUXNATFT+DDUKGURKYH Acid+Aspartame     REACTION: swelling       Objective:   Physical Exam  Constitutional: She is oriented to person, place, and time. She appears well-developed and well-nourished. No distress.        Obese white female  HENT:  Head: Normocephalic and atraumatic.  Left Ear: External ear normal.  Nose: Nose normal.  Mouth/Throat: Oropharynx is clear and moist.        Marked soft tissue swelling  and discharge from her left ear canal  Eyes: Conjunctivae and EOM are normal. Pupils are equal, round, and reactive to light.  Neck: Normal range of motion. Neck supple. No JVD present. No tracheal deviation present. No thyromegaly present.  Cardiovascular: Normal rate, regular rhythm, normal heart sounds and intact distal pulses.   No murmur heard. Pulmonary/Chest: Effort normal and breath sounds normal. She has no wheezes. She exhibits no tenderness.  Abdominal: Soft. Bowel sounds are normal.  Musculoskeletal: Normal range of motion. She exhibits no edema and no tenderness.  Lymphadenopathy:    She has no  cervical adenopathy.  Neurological: She is alert and oriented to person, place, and time. She has normal reflexes. No cranial nerve deficit.  Skin: Skin is warm and dry. She is not diaphoretic.  Psychiatric: She has a normal mood and affect. Her behavior is normal.          Assessment & Plan:   This is a routine physical  examination for this healthy  Female. Reviewed all health maintenance protocols including mammography colonoscopy bone density and reviewed appropriate screening labs. Her immunization history was reviewed as well as her current medications and allergies refills of her chronic medications were given and the plan for yearly health maintenance was discussed all orders and referrals were made as appropriate.

## 2011-01-10 NOTE — Patient Instructions (Signed)
U. Used the new pain medicine one by mouth daily for pain control I would take him a scheduled basis to stay ahead of the pain you have been referred to an orthopedist for knee replacement you should hear from my office either late this week or early next week with an appointment

## 2011-02-12 ENCOUNTER — Telehealth: Payer: Self-pay | Admitting: Internal Medicine

## 2011-02-12 NOTE — Telephone Encounter (Signed)
Pt needs refill tapentadol 75 mg call into [facility] [address] [phone]

## 2011-02-12 NOTE — Telephone Encounter (Signed)
Left message on machine No meds she takes sounds like that nor any med at 75 mg- ask pt to please call pharmacy for a refill

## 2011-02-13 ENCOUNTER — Other Ambulatory Visit: Payer: Self-pay | Admitting: *Deleted

## 2011-02-13 MED ORDER — TAPENTADOL HCL 75 MG PO TABS: 75.0000 mg | ORAL_TABLET | Freq: Every day | ORAL | Status: DC

## 2011-02-13 NOTE — Telephone Encounter (Signed)
This has been taken care of and husband will pick up nucynta script

## 2011-02-13 NOTE — Telephone Encounter (Signed)
Pt spelled med for me also she left message on your vm

## 2011-03-21 ENCOUNTER — Other Ambulatory Visit: Payer: Self-pay | Admitting: Internal Medicine

## 2011-03-21 MED ORDER — TAPENTADOL HCL 75 MG PO TABS: 75.0000 mg | ORAL_TABLET | Freq: Every day | ORAL | Status: DC

## 2011-03-21 NOTE — Telephone Encounter (Signed)
Refill Tapentadol.

## 2011-04-09 ENCOUNTER — Other Ambulatory Visit: Payer: Self-pay | Admitting: Internal Medicine

## 2011-04-11 ENCOUNTER — Other Ambulatory Visit: Payer: Self-pay | Admitting: Internal Medicine

## 2011-04-13 ENCOUNTER — Encounter: Payer: Self-pay | Admitting: Internal Medicine

## 2011-04-13 ENCOUNTER — Ambulatory Visit (INDEPENDENT_AMBULATORY_CARE_PROVIDER_SITE_OTHER): Payer: Private Health Insurance - Indemnity | Admitting: Internal Medicine

## 2011-04-13 VITALS — BP 140/80 | HR 76 | Temp 98.2°F | Resp 16 | Ht 64.0 in | Wt 232.0 lb

## 2011-04-13 DIAGNOSIS — M1710 Unilateral primary osteoarthritis, unspecified knee: Secondary | ICD-10-CM

## 2011-04-13 DIAGNOSIS — M25569 Pain in unspecified knee: Secondary | ICD-10-CM

## 2011-04-13 DIAGNOSIS — E785 Hyperlipidemia, unspecified: Secondary | ICD-10-CM

## 2011-04-13 DIAGNOSIS — I10 Essential (primary) hypertension: Secondary | ICD-10-CM

## 2011-04-13 MED ORDER — TAPENTADOL HCL 75 MG PO TABS: 75.0000 mg | ORAL_TABLET | Freq: Two times a day (BID) | ORAL | Status: DC

## 2011-04-13 NOTE — Progress Notes (Signed)
  Subjective:    Patient ID: Rose Rose, female    DOB: [birth year], 55 y.o.   MRN: [id number]  HPI Pain control is the issue Pain interferes with sleep Surgery scheduled for August     Review of Systems  Constitutional: Negative for activity change, appetite change and fatigue.  HENT: Negative for ear pain, congestion, neck pain, postnasal drip and sinus pressure.   Eyes: Negative for redness and visual disturbance.  Respiratory: Negative for cough, shortness of breath and wheezing.   Gastrointestinal: Negative for abdominal pain and abdominal distention.  Genitourinary: Negative for dysuria, frequency and menstrual problem.  Musculoskeletal: Negative for myalgias, joint swelling and arthralgias.  Skin: Negative for rash and wound.  Neurological: Negative for dizziness, weakness and headaches.  Hematological: Negative for adenopathy. Does not bruise/bleed easily.  Psychiatric/Behavioral: Negative for sleep disturbance and decreased concentration.   . Past Medical History  Diagnosis Date  . Hyperlipidemia   . Hypertension   . Arthritis    Past Surgical History  Procedure Date  . Tonsillectomy   . Myringotomy     reports that she quit smoking about 8 years ago. She has never used smokeless tobacco. She reports that she does not drink alcohol or use illicit drugs. family history includes Alzheimer's disease in her maternal aunt; Arthritis in her mother; Dementia in her father; Parkinsonism in her father; and Vision loss in her mother. Allergies  Allergen Reactions  . BJY:[id number]+MVHQIONGE+XBMWUXLKGM Acid+Aspartame     REACTION: swelling       Objective:   Physical Exam  Constitutional: She is oriented to person, place, and time. She appears well-developed and well-nourished. No distress.  HENT:  Head: Normocephalic and atraumatic.  Right Ear: External ear normal.  Left Ear: External ear normal.  Nose: Nose normal.  Mouth/Throat: Oropharynx is clear and moist.    Eyes: Conjunctivae and EOM are normal. Pupils are equal, round, and reactive to light.  Neck: Normal range of motion. Neck supple. No JVD present. No tracheal deviation present. No thyromegaly present.  Cardiovascular: Normal rate, regular rhythm, normal heart sounds and intact distal pulses.   No murmur heard. Pulmonary/Chest: Effort normal and breath sounds normal. She has no wheezes. She exhibits no tenderness.  Abdominal: Soft. Bowel sounds are normal.  Musculoskeletal: Normal range of motion. She exhibits no edema and no tenderness.  Lymphadenopathy:    She has no cervical adenopathy.  Neurological: She is alert and oriented to person, place, and time. She has normal reflexes. No cranial nerve deficit.  Skin: Skin is warm and dry. She is not diaphoretic.  Psychiatric: She has a normal mood and affect. Her behavior is normal.          Assessment & Plan:  Patient is approved for was pink surgery on August 23.  Her blood pressure is well-controlled she has a history of severe degenerative joint disease has put this off for too long we agree that the surgery is well needed.  Pain control is an issue at this time and we will increase her nucynta 75 mg by mouth twice a day

## 2011-05-07 ENCOUNTER — Other Ambulatory Visit: Payer: Self-pay | Admitting: Internal Medicine

## 2011-05-15 ENCOUNTER — Telehealth: Payer: Self-pay | Admitting: Internal Medicine

## 2011-05-15 NOTE — Telephone Encounter (Signed)
Refill Nucynta.

## 2011-05-18 ENCOUNTER — Telehealth: Payer: Self-pay | Admitting: Internal Medicine

## 2011-05-18 MED ORDER — TAPENTADOL HCL 75 MG PO TABS: 75.0000 mg | ORAL_TABLET | Freq: Two times a day (BID) | ORAL | Status: DC

## 2011-05-18 NOTE — Telephone Encounter (Signed)
Ready for pick up

## 2011-05-18 NOTE — Telephone Encounter (Signed)
Pt called say she requested refill on Tapentadol HCl (NUCYNTA) 75 MG early this week and has not received it yet. Pt is requesting refill.

## 2011-05-23 HISTORY — PX: TOTAL KNEE ARTHROPLASTY: SHX125

## 2011-06-08 ENCOUNTER — Other Ambulatory Visit: Payer: Self-pay | Admitting: Orthopedic Surgery

## 2011-06-08 ENCOUNTER — Ambulatory Visit (HOSPITAL_COMMUNITY)
Admission: RE | Admit: 2011-06-08 | Discharge: 2011-06-08 | Disposition: A | Discharge status: Home / Self Care | Payer: Private Health Insurance - Indemnity | Source: Ambulatory Visit | Attending: Orthopedic Surgery | Admitting: Orthopedic Surgery

## 2011-06-08 ENCOUNTER — Encounter (HOSPITAL_COMMUNITY): Payer: Private Health Insurance - Indemnity

## 2011-06-08 ENCOUNTER — Other Ambulatory Visit (HOSPITAL_COMMUNITY): Payer: Self-pay | Admitting: Orthopedic Surgery

## 2011-06-08 DIAGNOSIS — Z01818 Encounter for other preprocedural examination: Secondary | ICD-10-CM | POA: Insufficient documentation

## 2011-06-08 DIAGNOSIS — M1710 Unilateral primary osteoarthritis, unspecified knee: Secondary | ICD-10-CM | POA: Insufficient documentation

## 2011-06-08 DIAGNOSIS — Z01812 Encounter for preprocedural laboratory examination: Secondary | ICD-10-CM | POA: Insufficient documentation

## 2011-06-08 DIAGNOSIS — M479 Spondylosis, unspecified: Secondary | ICD-10-CM | POA: Insufficient documentation

## 2011-06-08 DIAGNOSIS — Z01811 Encounter for preprocedural respiratory examination: Secondary | ICD-10-CM

## 2011-06-08 LAB — URINALYSIS, ROUTINE W REFLEX MICROSCOPIC
Glucose, UA: NEGATIVE mg/dL
Ketones, ur: NEGATIVE mg/dL
Leukocytes, UA: NEGATIVE
Nitrite: NEGATIVE
Specific Gravity, Urine: 1.012 (ref 1.005–1.030)
pH: 7 (ref 5.0–8.0)

## 2011-06-08 LAB — URINE MICROSCOPIC-ADD ON

## 2011-06-08 LAB — CBC
MCV: 86.5 fL (ref 78.0–100.0)
Platelets: 329 10*3/uL (ref 150–400)
RBC: 4.65 MIL/uL (ref 3.87–5.11)
RDW: 14.2 % (ref 11.5–15.5)
WBC: 9.3 10*3/uL (ref 4.0–10.5)

## 2011-06-08 LAB — SURGICAL PCR SCREEN: MRSA, PCR: NEGATIVE

## 2011-06-08 LAB — COMPREHENSIVE METABOLIC PANEL
AST: 25 U/L (ref 0–37)
Albumin: 4.2 g/dL (ref 3.5–5.2)
Alkaline Phosphatase: 88 U/L (ref 39–117)
BUN: 11 mg/dL (ref 6–23)
CO2: 24 mEq/L (ref 19–32)
Chloride: 99 mEq/L (ref 96–112)
Creatinine, Ser: 0.51 mg/dL (ref 0.50–1.10)
GFR calc non Af Amer: >60 mL/min (ref >60)
Potassium: 4 mEq/L (ref 3.5–5.1)
Total Bilirubin: 0.3 mg/dL (ref 0.3–1.2)

## 2011-06-08 LAB — APTT: aPTT: 42 seconds — ABNORMAL HIGH (ref 24–37)

## 2011-06-08 LAB — PROTIME-INR
INR: 0.97 (ref 0.00–1.49)
Prothrombin Time: 13.1 seconds (ref 11.6–15.2)

## 2011-06-13 ENCOUNTER — Ambulatory Visit (INDEPENDENT_AMBULATORY_CARE_PROVIDER_SITE_OTHER): Payer: Private Health Insurance - Indemnity | Admitting: Internal Medicine

## 2011-06-13 VITALS — BP 154/90 | HR 80 | Temp 98.5°F | Resp 16 | Ht 63.0 in | Wt 232.0 lb

## 2011-06-13 DIAGNOSIS — J40 Bronchitis, not specified as acute or chronic: Secondary | ICD-10-CM

## 2011-06-13 MED ORDER — CEFUROXIME AXETIL 500 MG PO TABS: 500.0000 mg | ORAL_TABLET | Freq: Two times a day (BID) | ORAL | Status: AC

## 2011-06-13 NOTE — Assessment & Plan Note (Signed)
55 y/o with mild bronchitis.  She is worried about illness delaying her right knee surgery on Friday.  Recent pre op CXR reported normal. I suggest course of ceftin. Use OTC cough meds Patient advised to call office if symptoms persist or worsen.

## 2011-06-13 NOTE — Progress Notes (Signed)
Subjective:    Patient ID: Rose Rose, female    DOB: [birth year], 55 y.o.   MRN: [id number]  Cough This is a new problem. The current episode started in the past 7 days. The problem has been unchanged. The problem occurs every few minutes. The cough is productive of sputum. Associated symptoms include postnasal drip. Pertinent negatives include no chest pain, chills, fever or shortness of breath. She has tried nothing for the symptoms.   She is former smoker.  Quit 10 yrs ago.   Review of Systems  Constitutional: Negative for fever and chills.  HENT: Positive for postnasal drip.   Respiratory: Positive for cough. Negative for shortness of breath.   Cardiovascular: Negative for chest pain.       Past Medical History  Diagnosis Date  . Hyperlipidemia   . Hypertension   . Arthritis     History   Social History  . Marital Status: Married    Spouse Name: N/A    Number of Children: N/A  . Years of Education: N/A   Occupational History  . Not on file.   Social History Main Topics  . Smoking status: Former Smoker    Quit date: 10/22/2002  . Smokeless tobacco: Never Used  . Alcohol Use: No  . Drug Use: No  . Sexually Active: Not on file   Other Topics Concern  . Not on file   Social History Narrative  . No narrative on file    Past Surgical History  Procedure Date  . Tonsillectomy   . Myringotomy     Family History  Problem Relation Age of Onset  . Alzheimer's disease Maternal Aunt   . Arthritis Mother   . Vision loss Mother   . Dementia Father   . Parkinsonism Father     Allergies  Allergen Reactions  . JYN:WGNFAOZHYQM+VHQIONGEX+BMWUXLKGMW Acid+Aspartame     REACTION: swelling    Current Outpatient Prescriptions on File Prior to Visit  Medication Sig Dispense Refill  . Ascorbic Acid (VITAMIN C) 100 MG tablet Take 100 mg by mouth daily.        . Black Cohosh (REMIFEMIN) 20 MG TABS Take by mouth 2 (two) times daily.        . citalopram (CELEXA) 20 MG  tablet TAKE ONE TABLET BY MOUTH EVERY DAY  30 tablet  6  . diclofenac (VOLTAREN) 75 MG EC tablet Take 1 tablet (75 mg total) by mouth 2 (two) times daily.  60 tablet  3  . fish oil-omega-3 fatty acids 1000 MG capsule Take 2 g by mouth 2 (two) times daily.        . folic acid (FOLVITE) 1 MG tablet Take 1 mg by mouth daily.        Marland Kitchen pyridoxine (B-6) 100 MG tablet Take 100 mg by mouth daily.        . simvastatin (ZOCOR) 20 MG tablet Take 1 tablet (20 mg total) by mouth at bedtime.  30 tablet  6  . Tapentadol HCl (NUCYNTA) 75 MG TABS Take 1 tablet (75 mg total) by mouth 2 (two) times daily.  60 tablet  0  . traMADol (ULTRAM) 50 MG tablet TAKE ONE TO TWO TABLETS BY MOUTH EVERY 6 HOURS AS NEEDED FOR PAIN  60 tablet  2  . valACYclovir (VALTREX) 500 MG tablet Take 1 tablet (500 mg total) by mouth daily as needed.  30 tablet  1    BP 154/90  Pulse 80  Temp 98.5 F (36.9 C)  Resp 16  Ht 5\' 3"  (1.6 m)  Wt 232 lb (105.235 kg)  BMI 41.10 kg/m2    Objective:   Physical Exam   Constitutional: Appears well-developed and well-nourished. No distress.  Head: Normocephalic and atraumatic.  Right Ear: Right TM red and dull (chronic Left Ear: External ear normal.  Mouth/Throat: Oropharynx is clear and moist.  Eyes: Conjunctivae are normal. Pupils are equal, round, and reactive to light.  Neck: Normal range of motion. Neck supple. No thyromegaly present. No carotid bruit Cardiovascular: Normal rate, regular rhythm and normal heart sounds.  Exam reveals no gallop and no friction rub.   No murmur heard. Pulmonary/Chest: Effort normal .  Mild coarse breath sounds - upper lung fields      Assessment & Plan:

## 2011-06-13 NOTE — Patient Instructions (Signed)
Your can take over the counter robitussin or delsym for cough Patient advised to call office if symptoms persist or worsen.

## 2011-06-15 ENCOUNTER — Inpatient Hospital Stay (HOSPITAL_COMMUNITY)
Admission: RE | Admit: 2011-06-15 | Discharge: 2011-06-18 | DRG: 470 | Disposition: A | Discharge status: Home Health | Payer: Private Health Insurance - Indemnity | Source: Ambulatory Visit | Attending: Orthopedic Surgery | Admitting: Orthopedic Surgery

## 2011-06-15 DIAGNOSIS — Z01812 Encounter for preprocedural laboratory examination: Secondary | ICD-10-CM

## 2011-06-15 DIAGNOSIS — M1710 Unilateral primary osteoarthritis, unspecified knee: Principal | ICD-10-CM | POA: Diagnosis present

## 2011-06-15 DIAGNOSIS — E669 Obesity, unspecified: Secondary | ICD-10-CM | POA: Diagnosis present

## 2011-06-15 DIAGNOSIS — E7800 Pure hypercholesterolemia, unspecified: Secondary | ICD-10-CM | POA: Diagnosis present

## 2011-06-15 LAB — URINALYSIS, ROUTINE W REFLEX MICROSCOPIC
Bilirubin Urine: NEGATIVE
Hgb urine dipstick: NEGATIVE
Specific Gravity, Urine: 1.011 (ref 1.005–1.030)
Urobilinogen, UA: 0.2 mg/dL (ref 0.0–1.0)

## 2011-06-15 LAB — TYPE AND SCREEN: Antibody Screen: NEGATIVE

## 2011-06-16 LAB — CBC
Hemoglobin: 11.2 g/dL — ABNORMAL LOW (ref 12.0–15.0)
MCH: 28.2 pg (ref 26.0–34.0)
MCV: 86.6 fL (ref 78.0–100.0)
RBC: 3.97 MIL/uL (ref 3.87–5.11)

## 2011-06-16 LAB — BASIC METABOLIC PANEL
CO2: 26 mEq/L (ref 19–32)
Calcium: 8.4 mg/dL (ref 8.4–10.5)
Creatinine, Ser: <0.47 mg/dL — ABNORMAL LOW (ref 0.50–1.10)
Glucose, Bld: 133 mg/dL — ABNORMAL HIGH (ref 70–99)

## 2011-06-17 LAB — BASIC METABOLIC PANEL
Calcium: 9.1 mg/dL (ref 8.4–10.5)
Creatinine, Ser: <0.47 mg/dL — ABNORMAL LOW (ref 0.50–1.10)

## 2011-06-17 LAB — CBC
MCH: 29 pg (ref 26.0–34.0)
MCV: 85 fL (ref 78.0–100.0)
Platelets: 271 10*3/uL (ref 150–400)
RDW: 13.9 % (ref 11.5–15.5)

## 2011-06-18 LAB — CBC
MCV: 85.9 fL (ref 78.0–100.0)
Platelets: 281 10*3/uL (ref 150–400)
RDW: 14 % (ref 11.5–15.5)
WBC: 11.2 10*3/uL — ABNORMAL HIGH (ref 4.0–10.5)

## 2011-06-18 LAB — BASIC METABOLIC PANEL
Chloride: 100 mEq/L (ref 96–112)
Creatinine, Ser: <0.47 mg/dL — ABNORMAL LOW (ref 0.50–1.10)
Potassium: 3.6 mEq/L (ref 3.5–5.1)

## 2011-06-21 NOTE — Op Note (Signed)
Rose Rose, Rose Rose                  ACCOUNT NO.:  [id number]  MEDICAL RECORD NO.:  [id number]  LOCATION:  1602                         FACILITY:  [facility]  PHYSICIAN:  Ollen Gross, M.D.    DATE OF BIRTH:  18-Nov-1955  DATE OF PROCEDURE:  06/15/2011 DATE OF DISCHARGE:                              OPERATIVE REPORT   PREOPERATIVE DIAGNOSIS:  Osteoarthritis, right knee.  POSTOPERATIVE DIAGNOSIS:  Osteoarthritis, right knee.  PROCEDURE:  Right total knee arthroplasty.  SURGEON:  Ollen Gross, MD  ASSISTANT:  Alexzandrew L. Perkins, PA-C  ANESTHESIA:  Spinal.  ESTIMATED BLOOD LOSS:  Minimal.  DRAIN:  Hemovac x1.  TOURNIQUET TIME:  34 minutes at 300 mmHg.  COMPLICATIONS:  None.  CONDITION:  Stable to Recovery.  BRIEF CLINICAL NOTE:  Rose Rose is a 55 year old female with advanced end- stage arthritis of the right knee with progressively worsening pain and dysfunction.  She has failed nonoperative management and presents for right total knee arthroplasty.  PROCEDURE IN DETAIL:  After successful administration of spinal anesthetic, a tourniquet was placed high on her right thigh, and right lower extremity was prepped and draped in usual sterile fashion. Extremity was wrapped in an Esmarch, knee flexed, tourniquet inflated to 300 mmHg.  Midline incision was made with a 10 blade through subcutaneous tissue to the level of the extensor mechanism.  A fresh blade was used to make a medial parapatellar arthrotomy.  Soft tissue on the proximal medial tibia subperiosteally elevated to the joint line with the knife into the semimembranosus bursa with a Cobb elevator. Soft tissue laterally was elevated with attention being paid to avoid any patellar tendon on tibial tubercle.  The patella was everted, knee flexed 90 degrees, and ACL and PCL removed.  Drill was used to create a starting hole in the distal femur and the canal was thoroughly irrigated.  A 5-degree right valgus alignment  guide was placed and the distal femoral cutting block was pinned to remove 11 mm off the distal femur.  Distal femoral resection was made an oscillating saw.  The tibia subluxed forward and the menisci removed.  The extramedullary tibial alignment guide was placed referencing proximally at the medial aspect of tibial tubercle and distally along the second metatarsal axis and tibial crest.  The block was pinned to remove 2 mm of the more deficient medial side.  Tibial resection was made with an oscillating saw.  Size 2.5 was most appropriate tibial component and proximal tibia was prepared with a modular drill and keel punched for the size 2.5.  Femoral sizing guide was placed and size 3 was most appropriate. Rotation was marked off the epicondylar axis and confirmed by creating rectangular flexion gap at 90 degrees.  Block was pinned in this rotation and the anterior and posterior chamfer cuts made. Intercondylar block was placed and that cut was made.  Trial size 3 posterior stabilized femur was placed.  A 10 mm posterior stabilized rotating platform insert trial was placed.  There was a tiny bit of hyperextension, so we went for a 12.5 which allowed for full extension with excellent varus-valgus, anterior-posterior balance, throughout full range of motion.  Patella was everted,  thickness measured to be 22 mm. Freehand resection was taken to 12 mm, 35 template was placed, lug holes were drilled, trial patella was placed, and it tracked normally. Osteophytes removed off the posterior femur with the trial in place. All trials were removed and the cut bone surfaces were prepared with pulsatile lavage.  Cement was mixed and once ready for implantation, the size 2.5 mobile-bearing tibial tray, size 3 posterior stabilized femur, and 35 patella were cemented into place and the patella was held with a clamp.  Trial 12.5 mm insert was placed, knee held in full extension, and all extruded cement  removed.  When the cement was fully hardened, then the permanent 12.5 mm posterior stabilized rotating platform insert was placed in the tibial tray.  The wound was copiously irrigated with saline solution and the arthrotomy closed over Hemovac drain with interrupted #1 PDS.  Flexion against gravity was 135 degrees.  The patella tracked normally.  The tourniquet was then released total time of 34 minutes.  Subcutaneous was closed with interrupted 2-0 Vicryl and subcuticular with running 4-0 Monocryl.  Incision was cleaned and dried and the catheter for Marcaine pain pump was placed and the pump was initiated.  Steri-Strips and a bulky sterile dressing applied.  She was placed into a knee immobilizer, awakened, and transported to Recovery in stable condition.     Ollen Gross, M.D.     FA/MEDQ  D:  06/15/2011  T:  06/16/2011  Job:  [id number]  Electronically Signed by Ollen Gross M.D. on 06/21/2011 04:05:03 PM

## 2011-06-21 NOTE — H&P (Signed)
NAMEAMBERLE, LYTER                  ACCOUNT NO.:  [id number]  MEDICAL RECORD NO.:  [id number]  LOCATION:  1602                         FACILITY:  [facility]  PHYSICIAN:  Ollen Gross, M.D.    DATE OF BIRTH:  1956-05-17  DATE OF ADMISSION:  06/15/2011 DATE OF DISCHARGE:                             HISTORY & PHYSICAL   CHIEF COMPLAINT:  Right greater than left knee pain.  HISTORY OF PRESENT ILLNESS:  The patient is a 55 year old female who has been seen by Dr. Lequita Halt for ongoing bilateral knee pain.  The right is more symptomatic than the left.  She has been treated previously by Dr. Chaney Malling when he was at [facility].  The knee has been bothering her for over 2 years now, progressively getting worse, is limiting her function.  She has been seen in the office by Dr. Lequita Halt where x-rays show advanced medial compartment with focal bone-on-bone changes in the right, which are worse in the left.  It is felt, she would benefit from undergoing surgical intervention.  She has had cortisone injections and also has had viscous supplementation and failed both.  Due to the fact she has not improved with nonoperative management including injections, it is felt that she would benefit from undergoing surgical intervention. Risks and benefits have been discussed and she elected to proceed with surgery.  There is no active contraindication such as infection or rapidly progressive neurological disease.  She has been seen preoperative by Dr. Lovell Sheehan and felt to be stable for surgery.  ALLERGIES:  AUGMENTIN causes hands and feet swelling and tingling.  CURRENT MEDICATIONS:  Valacyclovir, Nucynta, diclofenac, simvastatin, citalopram, fish oil, Osteo Bi-Flex, vitamin B, folic acid, Remifemin, multivitamin, vitamin C, Advil, melatonin.  PAST MEDICAL HISTORY: 1. Impaired vision. 2. Past history of bronchitis. 3. Hypercholesterolemia.  PAST SURGICAL HISTORY:  Ear surgery in the summer of 1989.  She has  had some nausea and sickness with anesthesia in the past.  FAMILY HISTORY:  Noncontributory.  SOCIAL HISTORY:  Married, quit smoking about 2004, no alcohol.  She does have a caregiver lined up, has 1-step entering her home.  She does have a living will healthcare power of attorney.  REVIEW OF SYSTEMS:  GENERAL:  No fever, chills, or night sweats.  NEURO: No seizure, syncope, or paralysis.  RESPIRATORY:  No shortness of breath, productive cough, or hemoptysis.  CARDIOVASCULAR:  No chest pain, angina, orthopnea.  GI:  No nausea, vomiting, diarrhea, or constipation.  GU:  No dysuria, hematuria, or discharge. MUSCULOSKELETAL:  Right greater than left knee pain.  PHYSICAL EXAMINATION:  VITAL SIGNS:  Pulse 68, respirations 14, blood pressure 140/110. GENERAL:  A 55 year old white female, well nourished, well developed, in no acute distress.  She is alert, oriented, very pleasant, overweight, excellent historian, accompanied by her husband. HEENT:  Normocephalic, atraumatic.  Pupils are round and reactive.  EOMs intact.  Never wore glasses. NECK:  Supple.  No carotid bruits. CHEST:  Clear anterior and posterior chest walls.  No rhonchi, rales, or wheezing. HEART:  Regular rate and rhythm without murmur, S1, S2 noted. ABDOMEN:  Soft, round.  Bowel sounds present. RECTAL/BREASTS/GENITALIA:  Not done, not pertinent  to present illness. EXTREMITIES:  Right knee, range of motion 5-125, marked crepitus, tender more medial than lateral, no instability.  Left knee, no effusion, range of motion 5-125, tender more medial than lateral, no instability.  IMPRESSION:  Osteoarthritis, right knee greater than left knee.  PLAN:  The patient admitted to [facility] to undergo right total knee replacement arthroplasty.  Surgery will be performed by Dr. Ollen Gross.     Alexzandrew L. Julien Girt, P.A.C.   ______________________________ Ollen Gross, M.D.    ALP/MEDQ  D:  06/17/2011  T:   06/18/2011  Job:  [id number]  cc:   Dr. Lovell Sheehan  Electronically Signed by Patrica Duel P.A.C. on 06/18/2011 04:09:10 PM Electronically Signed by Ollen Gross M.D. on 06/21/2011 04:05:06 PM

## 2011-06-26 ENCOUNTER — Telehealth: Payer: Self-pay | Admitting: Internal Medicine

## 2011-06-26 NOTE — Telephone Encounter (Signed)
Pt requesting refill on Tapentadol HCl (NUCYNTA) 75 MG TABS Please contact when ready for pick up

## 2011-06-27 MED ORDER — TAPENTADOL HCL 75 MG PO TABS: 75.0000 mg | ORAL_TABLET | Freq: Two times a day (BID) | ORAL | Status: DC

## 2011-06-27 NOTE — Telephone Encounter (Signed)
rx up front ready for p/u, pt aware 

## 2011-06-27 NOTE — Telephone Encounter (Signed)
Ok x one

## 2011-07-08 ENCOUNTER — Other Ambulatory Visit: Payer: Self-pay | Admitting: Internal Medicine

## 2011-07-11 NOTE — Discharge Summary (Signed)
Rose Rose, EVANGELIST                  ACCOUNT NO.:  [id number]  MEDICAL RECORD NO.:  [id number]  LOCATION:  1602                         FACILITY:  [facility]  PHYSICIAN:  Ollen Gross, M.D.    DATE OF BIRTH:  [birth year]  DATE OF ADMISSION:  06/15/2011 DATE OF DISCHARGE:  06/18/2011                              DISCHARGE SUMMARY   ADMITTING DIAGNOSES: 1. Osteoarthritis, right greater than left knee with pain. 2. Impaired vision. 3. Past history of bronchitis. 4. Hypercholesterolemia.  DISCHARGE DIAGNOSES: 1. Osteoarthritis, right knee; status post right total knee     replacement and arthroplasty. 2. Postoperative hyponatremia, improved. 3. Postoperative hypokalemia, improved. 4. Impaired vision. 5. Past history of bronchitis. 6. Hypercholesterolemia.  PROCEDURE:  June 15, 2011, right total knee.  SURGEON:  Ollen Gross, M.D.  ASSISTANT:  Alexzandrew L. Perkins, P.A.C.  ANESTHESIA:  Spinal.  TOTAL TIME:  34 minutes.  CONSULTS:  None.  BRIEF HISTORY:  Rose Rose is a 55 year old female with advanced end-stage arthritis of right knee, progressive worsening pain and dysfunction, and failed nonoperative management, now presents for total knee arthroplasty.  LABORATORY DATA:  Admission CBC not scanned in the chart not available, but the followup CBC showed hemoglobin 11.2 on day 1 and 11.4 last night.  H and H 10.7 and 32.2.  The admission Chem panel not found on the chart, not available.  Serial BMETs were followed for 3 days. Sodium was down to 133 on day 1, but came up to 136; potassium dropped from 3.8-3.2 back up to 3.6; glucose went up 133 and 150 back down to 133.  Remaining electrolytes remained within normal limits.  UA on June 15, 2011, negative.  HOSPITAL COURSE:  The patient was admitted to [facility], taken to OR, underwent above-stated procedure without complication.  The patient tolerated the procedure well.  Transferred to recovery and in the  orthopedic floor for postop monitoring.  She was given 24 hours postop antibiotics started on Xarelto for DVT prophylaxis.  Doing fairly well on the morning of day 1, comfortable.  Started to get up and out of bed.  Hemovac drain was discontinued along with the PCA; by day two, she was weaned over to p.o. meds.  He did have some nausea at that point and we switched her medications around, dressing change incision looked good, slow to progress with physical therapy not ready for home.  She had continued to work with therapy and by day 3, she was doing much better, the nausea was resolved.  She progressed with PT.  Incision looked good.  She was tolerating meds and discharged home.  DISCHARGE/PLAN: 1. The patient was discharged home on June 18, 2011. 2. Discharge diagnoses, please see above. 3. Discharge medications:  Hydrocodone, Robaxin, and Xarelto.     Continue meds:  Benadryl, citalopram, Nucynta,simvastatin,     tramadol, and valacyclovir.  DIET:  Heart-healthy diet.  ACTIVITY:  Weightbearing as tolerated.  Total knee protocol.  FOLLOWUP:  Follow up in 2 weeks.  DISPOSITION:  Home.  CONDITION ON DISCHARGE:  Improved.     Alexzandrew L. Julien Girt, P.A.C.   ______________________________ Ollen Gross, M.D.    ALP/MEDQ  D:  07/05/2011  T:  07/05/2011  Job:  [id number]  cc:   Stacie Glaze, MD [address] [place] [address]  Electronically Signed by Ollen Gross M.D. on 07/11/2011 10:07:42 AM

## 2011-07-20 ENCOUNTER — Ambulatory Visit (INDEPENDENT_AMBULATORY_CARE_PROVIDER_SITE_OTHER): Payer: Private Health Insurance - Indemnity | Admitting: Internal Medicine

## 2011-07-20 ENCOUNTER — Encounter: Payer: Self-pay | Admitting: Internal Medicine

## 2011-07-20 VITALS — BP 160/100 | Temp 98.2°F | Wt 220.0 lb

## 2011-07-20 DIAGNOSIS — R112 Nausea with vomiting, unspecified: Secondary | ICD-10-CM

## 2011-07-20 MED ORDER — DIAZEPAM 2 MG PO TABS: 2.0000 mg | ORAL_TABLET | Freq: Two times a day (BID) | ORAL | Status: AC | PRN

## 2011-07-20 MED ORDER — ONDANSETRON HCL 4 MG PO TABS: 4.0000 mg | ORAL_TABLET | Freq: Three times a day (TID) | ORAL | Status: AC | PRN

## 2011-07-20 NOTE — Progress Notes (Signed)
Subjective:    Patient ID: Rose Rose, female    DOB: June 07, 1956, 55 y.o.   MRN: [id number]  HPI  55 year old white female presents with acute onset of vertigo with nausea and vomiting. Her symptoms started yesterday. She went to the bathroom as usual and got back into bed and when she rolled over she experienced acute sensation of vertigo/spinning. Over the next 24 hours patient has had intermittent nausea and vomiting. Her stools have been also more loose over the last 48 hours.  4 loose BMS.  She denies sick contacts. She has a slight headache. She denies hearing loss. she denies fever.  Review of Systems See HPI    Past Medical History  Diagnosis Date  . Hyperlipidemia   . Hypertension   . Arthritis     History   Social History  . Marital Status: Married    Spouse Name: N/A    Number of Children: N/A  . Years of Education: N/A   Occupational History  . Not on file.   Social History Main Topics  . Smoking status: Former Smoker    Quit date: 10/22/2002  . Smokeless tobacco: Never Used  . Alcohol Use: No  . Drug Use: No  . Sexually Active: Not on file   Other Topics Concern  . Not on file   Social History Narrative  . No narrative on file    Past Surgical History  Procedure Date  . Tonsillectomy   . Myringotomy     Right ear  . Total knee arthroplasty 05/2011    Dr. Despina Hick    Family History  Problem Relation Age of Onset  . Alzheimer's disease Maternal Aunt   . Arthritis Mother   . Vision loss Mother   . Dementia Father   . Parkinsonism Father     Allergies  Allergen Reactions  . WUJ:WJXBJYNWGNF+AOZHYQMVH+QIONGEXBMW Acid+Aspartame     REACTION: swelling    Current Outpatient Prescriptions on File Prior to Visit  Medication Sig Dispense Refill  . citalopram (CELEXA) 20 MG tablet TAKE ONE TABLET BY MOUTH EVERY DAY  30 tablet  6  . simvastatin (ZOCOR) 20 MG tablet Take 1 tablet (20 mg total) by mouth at bedtime.  30 tablet  6  . Tapentadol HCl  (NUCYNTA) 75 MG TABS Take 1 tablet (75 mg total) by mouth 2 (two) times daily.  60 tablet  0  . traMADol (ULTRAM) 50 MG tablet TAKE ONE TO TWO TABLETS BY MOUTH EVERY 6 HOURS AS NEEDED FOR PAIN  60 tablet  1  . valACYclovir (VALTREX) 500 MG tablet Take 1 tablet (500 mg total) by mouth daily as needed.  30 tablet  1  . Ascorbic Acid (VITAMIN C) 100 MG tablet Take 100 mg by mouth daily.        . Black Cohosh (REMIFEMIN) 20 MG TABS Take by mouth 2 (two) times daily.        . diclofenac (VOLTAREN) 75 MG EC tablet Take 1 tablet (75 mg total) by mouth 2 (two) times daily.  60 tablet  3  . fish oil-omega-3 fatty acids 1000 MG capsule Take 2 g by mouth 2 (two) times daily.        . folic acid (FOLVITE) 1 MG tablet Take 1 mg by mouth daily.        Marland Kitchen pyridoxine (B-6) 100 MG tablet Take 100 mg by mouth daily.          BP 160/100  Temp(Src) 98.2 F (36.8 C) (  Oral)  Wt 220 lb (99.791 kg)      Objective:   Physical Exam   Constitutional: Appears well-developed and well-nourished. No distress.  Head: Normocephalic and atraumatic.  Right Ear: External ear normal. Decreased hearing (chronic)  Left Ear: External ear normal.  Mouth/Throat: Oropharynx is clear and moist.  Eyes: Conjunctivae are normal. Pupils are equal, round, and reactive to light.  no nystagmus Neck: Normal range of motion. Neck supple. No thyromegaly present. No carotid bruit Cardiovascular: Normal rate, regular rhythm and normal heart sounds.  Exam reveals no gallop and no friction rub.   No murmur heard. Pulmonary/Chest: Effort normal and breath sounds normal.  No wheezes. No rales.  Abdominal: Soft. Bowel sounds are normal. No mass. There is no tenderness.  Neurological: Alert. No cranial nerve deficit. no ataxia.  Patient ambulates with a single prong cane Skin: Skin is warm and dry.  Psychiatric: Normal mood and affect. Behavior is normal.       Assessment & Plan:

## 2011-07-20 NOTE — Assessment & Plan Note (Signed)
I suspect patient's nausea and vomiting secondary to labyrinthitis versus positional vertigo or mild case of gastroenteritis. Treat with Zofran 4 mg 3 times a day as needed. Use diazepam 2 mg twice a day as needed. Patient advised increased fluid intake and call office for symptoms persist or worsen.

## 2011-07-20 NOTE — Patient Instructions (Signed)
Drink plenty of fluids. Please call our office if your symptoms do not improve or gets worse.

## 2011-08-07 ENCOUNTER — Ambulatory Visit (INDEPENDENT_AMBULATORY_CARE_PROVIDER_SITE_OTHER): Payer: Managed Care, Other (non HMO) | Admitting: Internal Medicine

## 2011-08-07 ENCOUNTER — Encounter: Payer: Self-pay | Admitting: Internal Medicine

## 2011-08-07 VITALS — BP 140/84 | HR 80 | Temp 98.3°F | Resp 16 | Ht 63.0 in | Wt 226.0 lb

## 2011-08-07 DIAGNOSIS — E785 Hyperlipidemia, unspecified: Secondary | ICD-10-CM

## 2011-08-07 DIAGNOSIS — M25369 Other instability, unspecified knee: Secondary | ICD-10-CM

## 2011-08-07 DIAGNOSIS — F329 Major depressive disorder, single episode, unspecified: Secondary | ICD-10-CM

## 2011-08-07 DIAGNOSIS — I10 Essential (primary) hypertension: Secondary | ICD-10-CM

## 2011-08-07 DIAGNOSIS — F32A Depression, unspecified: Secondary | ICD-10-CM

## 2011-08-07 MED ORDER — TRAMADOL HCL 50 MG PO TABS: 50.0000 mg | ORAL_TABLET | Freq: Four times a day (QID) | ORAL | Status: DC | PRN

## 2011-08-07 MED ORDER — DICLOFENAC SODIUM 75 MG PO TBEC: 75.0000 mg | DELAYED_RELEASE_TABLET | Freq: Two times a day (BID) | ORAL | Status: DC

## 2011-08-07 MED ORDER — TAPENTADOL HCL 75 MG PO TABS: 75.0000 mg | ORAL_TABLET | Freq: Two times a day (BID) | ORAL | Status: DC

## 2011-08-07 MED ORDER — CITALOPRAM HYDROBROMIDE 20 MG PO TABS: 20.0000 mg | ORAL_TABLET | Freq: Every day | ORAL | Status: DC

## 2011-08-07 NOTE — Patient Instructions (Signed)
Patient was instructed to continue all medications as prescribed. To stop at the checkout desk and schedule a followup appointment  

## 2011-08-13 ENCOUNTER — Ambulatory Visit: Payer: Private Health Insurance - Indemnity | Admitting: Internal Medicine

## 2011-08-27 ENCOUNTER — Other Ambulatory Visit: Payer: Self-pay | Admitting: Internal Medicine

## 2011-09-05 ENCOUNTER — Other Ambulatory Visit: Payer: Self-pay | Admitting: Internal Medicine

## 2011-09-05 DIAGNOSIS — M25369 Other instability, unspecified knee: Secondary | ICD-10-CM

## 2011-09-05 MED ORDER — TAPENTADOL HCL 75 MG PO TABS: 75.0000 mg | ORAL_TABLET | Freq: Two times a day (BID) | ORAL | Status: DC

## 2011-09-05 NOTE — Telephone Encounter (Signed)
READY FOR PICK UP

## 2011-09-05 NOTE — Telephone Encounter (Signed)
Pt called req script for Tapentadol HCl (NUCYNTA) 75 MG TABS.

## 2011-10-08 ENCOUNTER — Other Ambulatory Visit: Payer: Self-pay | Admitting: Internal Medicine

## 2011-10-09 ENCOUNTER — Telehealth: Payer: Self-pay | Admitting: Internal Medicine

## 2011-10-09 DIAGNOSIS — M25369 Other instability, unspecified knee: Secondary | ICD-10-CM

## 2011-10-09 MED ORDER — TAPENTADOL HCL 75 MG PO TABS: 75.0000 mg | ORAL_TABLET | Freq: Two times a day (BID) | ORAL | Status: DC

## 2011-10-09 NOTE — Telephone Encounter (Signed)
Printed and waiting for siganature

## 2011-10-09 NOTE — Telephone Encounter (Signed)
Pt called req refill of Tapentadol HCl (NUCYNTA) 75 MG TABS. Pls call when ready for pick up.

## 2011-11-12 ENCOUNTER — Ambulatory Visit (INDEPENDENT_AMBULATORY_CARE_PROVIDER_SITE_OTHER): Payer: Managed Care, Other (non HMO) | Admitting: Internal Medicine

## 2011-11-12 ENCOUNTER — Encounter: Payer: Self-pay | Admitting: Internal Medicine

## 2011-11-12 VITALS — BP 140/80 | HR 76 | Temp 98.2°F | Resp 16 | Ht 63.0 in | Wt 225.0 lb

## 2011-11-12 DIAGNOSIS — I10 Essential (primary) hypertension: Secondary | ICD-10-CM

## 2011-11-12 DIAGNOSIS — M25369 Other instability, unspecified knee: Secondary | ICD-10-CM

## 2011-11-12 DIAGNOSIS — M238X9 Other internal derangements of unspecified knee: Secondary | ICD-10-CM

## 2011-11-12 DIAGNOSIS — E785 Hyperlipidemia, unspecified: Secondary | ICD-10-CM

## 2011-11-12 MED ORDER — VALACYCLOVIR HCL 500 MG PO TABS: 500.0000 mg | ORAL_TABLET | Freq: Every day | ORAL | Status: DC | PRN

## 2011-11-12 MED ORDER — TAPENTADOL HCL 75 MG PO TABS: 75.0000 mg | ORAL_TABLET | Freq: Two times a day (BID) | ORAL | Status: DC

## 2011-11-12 NOTE — Progress Notes (Signed)
Subjective:    Patient ID: Rose Rose, female    DOB: 1956-09-11, 56 y.o.   MRN: [id number]  HPI Discussion of weight and DJD Post joint replacement with good results Stable blood pressure Refills of pain medications     Review of Systems  Constitutional: Negative for activity change, appetite change and fatigue.  HENT: Negative for ear pain, congestion, neck pain, postnasal drip and sinus pressure.   Eyes: Negative for redness and visual disturbance.  Respiratory: Negative for cough, shortness of breath and wheezing.   Gastrointestinal: Negative for abdominal pain and abdominal distention.  Genitourinary: Negative for dysuria, frequency and menstrual problem.  Musculoskeletal: Negative for myalgias, joint swelling and arthralgias.  Skin: Negative for rash and wound.  Neurological: Negative for dizziness, weakness and headaches.  Hematological: Negative for adenopathy. Does not bruise/bleed easily.  Psychiatric/Behavioral: Negative for sleep disturbance and decreased concentration.   Past Medical History  Diagnosis Date  . Hyperlipidemia   . Hypertension   . Arthritis     History   Social History  . Marital Status: Married    Spouse Name: N/A    Number of Children: N/A  . Years of Education: N/A   Occupational History  . Not on file.   Social History Main Topics  . Smoking status: Former Smoker    Quit date: 10/22/2002  . Smokeless tobacco: Never Used  . Alcohol Use: No  . Drug Use: No  . Sexually Active: Not on file   Other Topics Concern  . Not on file   Social History Narrative  . No narrative on file    Past Surgical History  Procedure Date  . Tonsillectomy   . Myringotomy     Right ear  . Total knee arthroplasty 05/2011    Dr. Despina Hick    Family History  Problem Relation Age of Onset  . Alzheimer's disease Maternal Aunt   . Arthritis Mother   . Vision loss Mother   . Dementia Father   . Parkinsonism Father     Allergies  Allergen  Reactions  . EXB:[id number] Acid+Aspartame     REACTION: swelling    Current Outpatient Prescriptions on File Prior to Visit  Medication Sig Dispense Refill  . Ascorbic Acid (VITAMIN C) 100 MG tablet Take 100 mg by mouth daily.        . Black Cohosh (REMIFEMIN) 20 MG TABS Take by mouth 2 (two) times daily.        . citalopram (CELEXA) 20 MG tablet Take 1 tablet (20 mg total) by mouth daily.  30 tablet  6  . diclofenac (VOLTAREN) 75 MG EC tablet TAKE ONE TABLET BY MOUTH TWICE DAILY  60 tablet  3  . diphenhydrAMINE (SOMINEX) 25 MG tablet Take 25 mg by mouth at bedtime as needed.        . fish oil-omega-3 fatty acids 1000 MG capsule Take 2 g by mouth 2 (two) times daily.        . folic acid (FOLVITE) 1 MG tablet Take 1 mg by mouth daily.        . methocarbamol (ROBAXIN) 500 MG tablet Take 500 mg by mouth 4 (four) times daily.        Marland Kitchen pyridoxine (B-6) 100 MG tablet Take 100 mg by mouth daily.        . simvastatin (ZOCOR) 20 MG tablet Take 1 tablet (20 mg total) by mouth at bedtime.  30 tablet  6  . traMADol (ULTRAM) 50 MG tablet TAKE  ONE TABLET BY MOUTH EVERY 6 HOURS AS NEEDED FOR PAIN  60 tablet  1    BP 140/80  Pulse 76  Temp 98.2 F (36.8 C)  Resp 16  Ht 5\' 3"  (1.6 m)  Wt 225 lb (102.059 kg)  BMI 39.86 kg/m2       Objective:   Physical Exam  Nursing note and vitals reviewed. Constitutional: She is oriented to person, place, and time. She appears well-developed and well-nourished. No distress.  HENT:  Head: Normocephalic and atraumatic.  Right Ear: External ear normal.  Left Ear: External ear normal.  Nose: Nose normal.  Mouth/Throat: Oropharynx is clear and moist.  Eyes: Conjunctivae and EOM are normal. Pupils are equal, round, and reactive to light.  Neck: Normal range of motion. Neck supple. No JVD present. No tracheal deviation present. No thyromegaly present.  Cardiovascular: Normal rate, regular rhythm, normal heart sounds and intact distal  pulses.   No murmur heard. Pulmonary/Chest: Effort normal and breath sounds normal. She has no wheezes. She exhibits no tenderness.  Abdominal: Soft. Bowel sounds are normal.  Musculoskeletal: Normal range of motion. She exhibits no edema and no tenderness.  Lymphadenopathy:    She has no cervical adenopathy.  Neurological: She is alert and oriented to person, place, and time. She has normal reflexes. No cranial nerve deficit.  Skin: Skin is warm and dry. She is not diaphoretic.  Psychiatric: She has a normal mood and affect. Her behavior is normal.          Assessment & Plan:  Stable blood pressure Refill of pain medications

## 2011-11-12 NOTE — Patient Instructions (Signed)
The patient is instructed to continue all medications as prescribed. Schedule followup with check out clerk upon leaving the clinic  

## 2011-12-10 ENCOUNTER — Other Ambulatory Visit: Payer: Self-pay | Admitting: Internal Medicine

## 2011-12-19 ENCOUNTER — Other Ambulatory Visit: Payer: Self-pay | Admitting: Internal Medicine

## 2011-12-26 ENCOUNTER — Telehealth: Payer: Self-pay | Admitting: Internal Medicine

## 2011-12-26 DIAGNOSIS — M25369 Other instability, unspecified knee: Secondary | ICD-10-CM

## 2011-12-26 MED ORDER — TAPENTADOL HCL 75 MG PO TABS: 75.0000 mg | ORAL_TABLET | Freq: Two times a day (BID) | ORAL | Status: DC

## 2011-12-26 NOTE — Telephone Encounter (Signed)
Pt called req refill of Tapentadol HCl (NUCYNTA) 75 MG TABS.  ° °

## 2011-12-26 NOTE — Telephone Encounter (Signed)
Printed and awaiting sig--will be ready after 3 pm today

## 2012-01-10 ENCOUNTER — Other Ambulatory Visit: Payer: Self-pay | Admitting: Internal Medicine

## 2012-02-05 ENCOUNTER — Telehealth: Payer: Self-pay | Admitting: Internal Medicine

## 2012-02-05 DIAGNOSIS — M25369 Other instability, unspecified knee: Secondary | ICD-10-CM

## 2012-02-05 MED ORDER — TAPENTADOL HCL 75 MG PO TABS: 75.0000 mg | ORAL_TABLET | Freq: Two times a day (BID) | ORAL | Status: DC

## 2012-02-05 NOTE — Telephone Encounter (Signed)
Pt requesting refill on Tapentadol HCl (NUCYNTA) 75 MG TABS

## 2012-02-05 NOTE — Telephone Encounter (Signed)
Printed- will call pt when signed

## 2012-02-27 ENCOUNTER — Telehealth: Payer: Self-pay | Admitting: Internal Medicine

## 2012-02-27 MED ORDER — TIZANIDINE HCL 4 MG PO TABS: 4.0000 mg | ORAL_TABLET | Freq: Four times a day (QID) | ORAL | Status: DC | PRN

## 2012-02-27 NOTE — Telephone Encounter (Signed)
Sent to pharmacy 

## 2012-02-27 NOTE — Telephone Encounter (Signed)
Patient called stating that the MD told her to call back with the name of the rx her spouse is on so that he may call it in for her as well. Please call in  tizanidine 4 mg to [facility] on [address]. Please assist.

## 2012-03-10 ENCOUNTER — Ambulatory Visit: Payer: Managed Care, Other (non HMO) | Admitting: Internal Medicine

## 2012-03-18 ENCOUNTER — Other Ambulatory Visit: Payer: Self-pay | Admitting: Internal Medicine

## 2012-03-26 ENCOUNTER — Encounter: Payer: Self-pay | Admitting: Internal Medicine

## 2012-03-26 ENCOUNTER — Other Ambulatory Visit: Payer: Self-pay | Admitting: *Deleted

## 2012-03-26 ENCOUNTER — Ambulatory Visit (INDEPENDENT_AMBULATORY_CARE_PROVIDER_SITE_OTHER): Payer: Managed Care, Other (non HMO) | Admitting: Internal Medicine

## 2012-03-26 VITALS — BP 170/100 | HR 76 | Temp 98.2°F | Resp 16 | Ht 63.0 in | Wt 234.0 lb

## 2012-03-26 DIAGNOSIS — M1990 Unspecified osteoarthritis, unspecified site: Secondary | ICD-10-CM

## 2012-03-26 DIAGNOSIS — G8929 Other chronic pain: Secondary | ICD-10-CM

## 2012-03-26 DIAGNOSIS — E669 Obesity, unspecified: Secondary | ICD-10-CM

## 2012-03-26 DIAGNOSIS — I10 Essential (primary) hypertension: Secondary | ICD-10-CM

## 2012-03-26 DIAGNOSIS — M25579 Pain in unspecified ankle and joints of unspecified foot: Secondary | ICD-10-CM

## 2012-03-26 DIAGNOSIS — M25369 Other instability, unspecified knee: Secondary | ICD-10-CM

## 2012-03-26 MED ORDER — ETODOLAC 300 MG PO CAPS: 300.0000 mg | ORAL_CAPSULE | Freq: Three times a day (TID) | ORAL | Status: AC

## 2012-03-26 MED ORDER — TAPENTADOL HCL 75 MG PO TABS: 75.0000 mg | ORAL_TABLET | Freq: Two times a day (BID) | ORAL | Status: DC

## 2012-03-26 NOTE — Progress Notes (Signed)
Subjective:    Patient ID: Rose Rose, female    DOB: 1956-04-07, 56 y.o.   MRN: [id number]  HPI Patient is a 56 year old female is followed for hypertension and a history of osteoarthritis status post knee replacement on the right. She has progressive ankle pain possibly positional in nature possibly secondary to knee surgery and arthritis in the opposite knee.  She has been on a nonsteroidal (for some time now and may develop tolerance to it.  She also has menopausal symptoms unrepentant. She's had a gradual continual weight loss and now her BMI is greater than 30 which creates medical risks   Review of Systems  Constitutional: Negative for activity change, appetite change and fatigue.  HENT: Negative for ear pain, congestion, neck pain, postnasal drip and sinus pressure.   Eyes: Negative for redness and visual disturbance.  Respiratory: Negative for cough, shortness of breath and wheezing.   Gastrointestinal: Negative for abdominal pain and abdominal distention.  Genitourinary: Negative for dysuria, frequency and menstrual problem.  Musculoskeletal: Negative for myalgias, joint swelling and arthralgias.  Skin: Negative for rash and wound.  Neurological: Negative for dizziness, weakness and headaches.  Hematological: Negative for adenopathy. Does not bruise/bleed easily.  Psychiatric/Behavioral: Negative for sleep disturbance and decreased concentration.   Past Medical History  Diagnosis Date  . Hyperlipidemia   . Hypertension   . Arthritis     History   Social History  . Marital Status: Married    Spouse Name: N/A    Number of Children: N/A  . Years of Education: N/A   Occupational History  . Not on file.   Social History Main Topics  . Smoking status: Former Smoker    Quit date: 10/22/2002  . Smokeless tobacco: Never Used  . Alcohol Use: No  . Drug Use: No  . Sexually Active: Not on file   Other Topics Concern  . Not on file   Social History Narrative  . No  narrative on file    Past Surgical History  Procedure Date  . Tonsillectomy   . Myringotomy     Right ear  . Total knee arthroplasty 05/2011    Dr. Despina Hick    Family History  Problem Relation Age of Onset  . Alzheimer's disease Maternal Aunt   . Arthritis Mother   . Vision loss Mother   . Dementia Father   . Parkinsonism Father     Allergies  Allergen Reactions  . Amoxicillin-Pot Clavulanate     REACTION: swelling    Current Outpatient Prescriptions on File Prior to Visit  Medication Sig Dispense Refill  . Ascorbic Acid (VITAMIN C) 100 MG tablet Take 100 mg by mouth daily.        . Black Cohosh (REMIFEMIN) 20 MG TABS Take by mouth 2 (two) times daily.        . citalopram (CELEXA) 20 MG tablet Take 1 tablet (20 mg total) by mouth daily.  30 tablet  6  . diazepam (VALIUM) 2 MG tablet TAKE ONE TABLET BY MOUTH EVERY 12 HOURS AS NEEDED FOR DIZZINESS  30 tablet  3  . diclofenac (VOLTAREN) 75 MG EC tablet TAKE ONE TABLET BY MOUTH TWICE DAILY.  60 tablet  6  . diphenhydrAMINE (SOMINEX) 25 MG tablet Take 25 mg by mouth at bedtime as needed.        . fish oil-omega-3 fatty acids 1000 MG capsule Take 2 g by mouth 2 (two) times daily.        . folic  acid (FOLVITE) 1 MG tablet Take 1 mg by mouth daily.        . methocarbamol (ROBAXIN) 500 MG tablet Take 500 mg by mouth 4 (four) times daily.        Marland Kitchen pyridoxine (B-6) 100 MG tablet Take 100 mg by mouth daily.        . simvastatin (ZOCOR) 20 MG tablet TAKE ONE TABLET BY MOUTH AT BEDTIME  90 tablet  3  . traMADol (ULTRAM) 50 MG tablet TAKE ONE TABLET BY MOUTH EVERY 6 HOURS AS NEEDED FOR PAIN  60 tablet  3  . valACYclovir (VALTREX) 500 MG tablet Take 1 tablet (500 mg total) by mouth daily as needed.  30 tablet  3    BP 170/100  Pulse 76  Temp 98.2 F (36.8 C)  Resp 16  Ht 5\' 3"  (1.6 m)  Wt 234 lb (106.142 kg)  BMI 41.45 kg/m2        Objective:   Physical Exam  Nursing note and vitals reviewed. Constitutional: She is oriented  to person, place, and time. She appears well-developed and well-nourished. No distress.  HENT:  Head: Normocephalic and atraumatic.  Right Ear: External ear normal.  Left Ear: External ear normal.  Nose: Nose normal.  Mouth/Throat: Oropharynx is clear and moist.  Eyes: Conjunctivae and EOM are normal. Pupils are equal, round, and reactive to light.  Neck: Normal range of motion. Neck supple. No JVD present. No tracheal deviation present. No thyromegaly present.  Cardiovascular: Normal rate, regular rhythm, normal heart sounds and intact distal pulses.   No murmur heard. Pulmonary/Chest: Effort normal and breath sounds normal. She has no wheezes. She exhibits no tenderness.  Abdominal: Soft. Bowel sounds are normal.  Musculoskeletal: Normal range of motion. She exhibits no edema and no tenderness.  Lymphadenopathy:    She has no cervical adenopathy.  Neurological: She is alert and oriented to person, place, and time. She has normal reflexes. No cranial nerve deficit.  Skin: Skin is warm and dry. She is not diaphoretic.  Psychiatric: She has a normal mood and affect. Her behavior is normal.          Assessment & Plan:  We discussed dietary intervention with exercise because of her ankle pain using a stationary bike or even water aerobics may be beneficial. Portion calorie reduction is recommended A program such as Weight Watchers would be beneficial Will change voltarin to etodolac

## 2012-03-26 NOTE — Patient Instructions (Signed)
The patient is instructed to continue all medications as prescribed. Schedule followup with check out clerk upon leaving the clinic  

## 2012-04-06 ENCOUNTER — Other Ambulatory Visit: Payer: Self-pay | Admitting: Internal Medicine

## 2012-04-17 ENCOUNTER — Other Ambulatory Visit: Payer: Self-pay | Admitting: Internal Medicine

## 2012-05-03 ENCOUNTER — Other Ambulatory Visit: Payer: Self-pay | Admitting: Internal Medicine

## 2012-06-06 ENCOUNTER — Telehealth: Payer: Self-pay | Admitting: Internal Medicine

## 2012-06-06 DIAGNOSIS — M25369 Other instability, unspecified knee: Secondary | ICD-10-CM

## 2012-06-06 MED ORDER — TAPENTADOL HCL 75 MG PO TABS: 75.0000 mg | ORAL_TABLET | Freq: Two times a day (BID) | ORAL | Status: DC

## 2012-06-06 NOTE — Telephone Encounter (Signed)
Pt called req refill of Tapentadol HCl (NUCYNTA) 75 MG TABS.

## 2012-06-06 NOTE — Telephone Encounter (Signed)
Printed and will call pt when ready for pick up 

## 2012-06-10 ENCOUNTER — Other Ambulatory Visit: Payer: Self-pay | Admitting: *Deleted

## 2012-06-10 MED ORDER — TRAMADOL HCL 50 MG PO TABS: 50.0000 mg | ORAL_TABLET | Freq: Four times a day (QID) | ORAL | Status: DC | PRN

## 2012-07-07 ENCOUNTER — Other Ambulatory Visit: Payer: Self-pay | Admitting: Internal Medicine

## 2012-07-07 ENCOUNTER — Ambulatory Visit: Payer: Managed Care, Other (non HMO) | Admitting: Internal Medicine

## 2012-07-08 ENCOUNTER — Encounter: Payer: Self-pay | Admitting: Internal Medicine

## 2012-07-08 ENCOUNTER — Ambulatory Visit (INDEPENDENT_AMBULATORY_CARE_PROVIDER_SITE_OTHER): Payer: Managed Care, Other (non HMO) | Admitting: Internal Medicine

## 2012-07-08 VITALS — BP 160/90 | HR 76 | Temp 98.2°F | Resp 16 | Ht 63.0 in | Wt 226.0 lb

## 2012-07-08 DIAGNOSIS — M1990 Unspecified osteoarthritis, unspecified site: Secondary | ICD-10-CM

## 2012-07-08 DIAGNOSIS — E669 Obesity, unspecified: Secondary | ICD-10-CM

## 2012-07-08 DIAGNOSIS — I10 Essential (primary) hypertension: Secondary | ICD-10-CM

## 2012-07-08 DIAGNOSIS — H6690 Otitis media, unspecified, unspecified ear: Secondary | ICD-10-CM

## 2012-07-08 MED ORDER — TIZANIDINE HCL 4 MG PO TABS: 4.0000 mg | ORAL_TABLET | Freq: Three times a day (TID) | ORAL | Status: DC | PRN

## 2012-07-08 MED ORDER — NEOMYCIN-POLYMYXIN-HC 3.5-10000-1 OT SUSP: 3.0000 [drp] | Freq: Four times a day (QID) | OTIC | Status: DC

## 2012-07-08 NOTE — Progress Notes (Signed)
Subjective:    Patient ID: Rose Rose, female    DOB: [birth year], 56 y.o.   MRN: [id number]  HPI Patient presents for routine followup she has history of hypertension hyperlipidemia osteoarthritis.  Initial blood pressure was elevated a recheck of her blood pressure showed 148/.80 She is having hot flashes / menopausal symptoms  SHe denies CP or SOB   Review of Systems  Constitutional: Negative for activity change, appetite change and fatigue.  HENT: Negative for ear pain, congestion, neck pain, postnasal drip and sinus pressure.   Eyes: Negative for redness and visual disturbance.  Respiratory: Negative for cough, shortness of breath and wheezing.   Gastrointestinal: Negative for abdominal pain and abdominal distention.  Genitourinary: Negative for dysuria, frequency and menstrual problem.  Musculoskeletal: Negative for myalgias, joint swelling and arthralgias.  Skin: Negative for rash and wound.  Neurological: Negative for dizziness, weakness and headaches.  Hematological: Negative for adenopathy. Does not bruise/bleed easily.  Psychiatric/Behavioral: Negative for disturbed wake/sleep cycle and decreased concentration.   Past Medical History  Diagnosis Date  . Hyperlipidemia   . Hypertension   . Arthritis     History   Social History  . Marital Status: Married    Spouse Name: N/A    Number of Children: N/A  . Years of Education: N/A   Occupational History  . Not on file.   Social History Main Topics  . Smoking status: Former Smoker    Quit date: 10/22/2002  . Smokeless tobacco: Never Used  . Alcohol Use: No  . Drug Use: No  . Sexually Active: Not on file   Other Topics Concern  . Not on file   Social History Narrative  . No narrative on file    Past Surgical History  Procedure Date  . Tonsillectomy   . Myringotomy     Right ear  . Total knee arthroplasty 05/2011    Dr. Despina Hick    Family History  Problem Relation Age of Onset  . Alzheimer's  disease Maternal Aunt   . Arthritis Mother   . Vision loss Mother   . Dementia Father   . Parkinsonism Father     Allergies  Allergen Reactions  . Amoxicillin-Pot Clavulanate     REACTION: swelling    Current Outpatient Prescriptions on File Prior to Visit  Medication Sig Dispense Refill  . Ascorbic Acid (VITAMIN C) 100 MG tablet Take 100 mg by mouth daily.        . Black Cohosh (REMIFEMIN) 20 MG TABS Take by mouth 2 (two) times daily.        . citalopram (CELEXA) 20 MG tablet TAKE ONE TABLET BY MOUTH DAILY.  30 tablet  6  . diazepam (VALIUM) 2 MG tablet TAKE ONE TABLET BY MOUTH EVERY 12 HOURS AS NEEDED FOR DIZZINESS  30 tablet  3  . diphenhydrAMINE (SOMINEX) 25 MG tablet Take 25 mg by mouth at bedtime as needed.        . etodolac (LODINE) 300 MG capsule Take 1 capsule (300 mg total) by mouth every 8 (eight) hours.  90 capsule  11  . fish oil-omega-3 fatty acids 1000 MG capsule Take 2 g by mouth 2 (two) times daily.        . folic acid (FOLVITE) 1 MG tablet Take 1 mg by mouth daily.        Marland Kitchen pyridoxine (B-6) 100 MG tablet Take 100 mg by mouth daily.        . simvastatin (ZOCOR) 20 MG  tablet TAKE ONE TABLET BY MOUTH AT BEDTIME  90 tablet  3  . Tapentadol HCl (NUCYNTA) 75 MG TABS Take 1 tablet (75 mg total) by mouth 2 (two) times daily.  60 tablet  0  . tiZANidine (ZANAFLEX) 4 MG tablet TAKE ONE TABLET BY MOUTH EVERY 6 HOURS AS NEEDED  30 tablet  0  . traMADol (ULTRAM) 50 MG tablet Take 1 tablet (50 mg total) by mouth every 6 (six) hours as needed for pain.  60 tablet  3  . valACYclovir (VALTREX) 500 MG tablet Take 1 tablet (500 mg total) by mouth daily as needed.  30 tablet  3    BP 160/90  Pulse 76  Temp 98.2 F (36.8 C)  Resp 16  Ht 5\' 3"  (1.6 m)  Wt 226 lb (102.513 kg)  BMI 40.03 kg/m2        Objective:   Physical Exam  Nursing note and vitals reviewed. Constitutional: She is oriented to person, place, and time. She appears well-developed and well-nourished. No  distress.  HENT:  Head: Normocephalic and atraumatic.  Right Ear: External ear normal.  Left Ear: External ear normal.  Nose: Nose normal.  Mouth/Throat: Oropharynx is clear and moist.  Eyes: Conjunctivae normal and EOM are normal. Pupils are equal, round, and reactive to light.  Neck: Normal range of motion. Neck supple. No JVD present. No tracheal deviation present. No thyromegaly present.  Cardiovascular: Normal rate, regular rhythm, normal heart sounds and intact distal pulses.   No murmur heard. Pulmonary/Chest: Effort normal and breath sounds normal. She has no wheezes. She exhibits no tenderness.  Abdominal: Soft. Bowel sounds are normal.  Musculoskeletal: Normal range of motion. She exhibits no edema and no tenderness.  Lymphadenopathy:    She has no cervical adenopathy.  Neurological: She is alert and oriented to person, place, and time. She has normal reflexes. No cranial nerve deficit.  Skin: Skin is warm and dry. She is not diaphoretic.  Psychiatric: She has a normal mood and affect. Her behavior is normal.          Assessment & Plan:  Obesity:  reviewed a gluten free diet Poorly controlled HTN possibly worsened by menopause Joint pain controled Exercise has helped!  I have spent more than 30 minutes examining this patient face-to-face of which over half was spent in counseling about weight management

## 2012-07-08 NOTE — Progress Notes (Signed)
  Subjective:    Patient ID: Rose Rose, female    DOB: 24-Oct-1955, 56 y.o.   MRN: [id number]  HPI    Review of Systems     Objective:   Physical Exam        Assessment & Plan:

## 2012-07-08 NOTE — Addendum Note (Signed)
Addended by: Stacie Glaze on: 07/08/2012 03:28 PM   Modules accepted: Orders

## 2012-07-24 NOTE — Progress Notes (Signed)
  Subjective:    Patient ID: Rose Rose, female    DOB: September 03, 1956, 56 y.o.   MRN: [id number]  HPI    Review of Systems     Objective:   Physical Exam        Assessment & Plan:  Medication recheck  For depression Continue current medication

## 2012-08-22 ENCOUNTER — Other Ambulatory Visit: Payer: Self-pay | Admitting: *Deleted

## 2012-08-22 MED ORDER — DIAZEPAM 2 MG PO TABS: 2.0000 mg | ORAL_TABLET | Freq: Two times a day (BID) | ORAL | Status: DC | PRN

## 2012-09-15 ENCOUNTER — Other Ambulatory Visit: Payer: Self-pay | Admitting: Internal Medicine

## 2012-09-17 ENCOUNTER — Other Ambulatory Visit: Payer: Managed Care, Other (non HMO)

## 2012-09-19 ENCOUNTER — Other Ambulatory Visit (INDEPENDENT_AMBULATORY_CARE_PROVIDER_SITE_OTHER): Payer: Managed Care, Other (non HMO)

## 2012-09-19 DIAGNOSIS — Z0000 Encounter for general adult medical examination without abnormal findings: Secondary | ICD-10-CM

## 2012-09-19 LAB — POCT URINALYSIS DIPSTICK
Glucose, UA: NEGATIVE
Leukocytes, UA: NEGATIVE
Nitrite, UA: NEGATIVE
Urobilinogen, UA: 0.2

## 2012-09-19 LAB — CBC WITH DIFFERENTIAL/PLATELET
Basophils Absolute: 0 10*3/uL (ref 0.0–0.1)
Eosinophils Absolute: 0.1 10*3/uL (ref 0.0–0.7)
HCT: 43.4 % (ref 36.0–46.0)
Hemoglobin: 13.9 g/dL (ref 12.0–15.0)
Lymphs Abs: 2.4 10*3/uL (ref 0.7–4.0)
MCHC: 32.1 g/dL (ref 30.0–36.0)
Monocytes Absolute: 0.4 10*3/uL (ref 0.1–1.0)
Neutro Abs: 4.3 10*3/uL (ref 1.4–7.7)
Platelets: 279 10*3/uL (ref 150.0–400.0)
RDW: 14.7 % — ABNORMAL HIGH (ref 11.5–14.6)

## 2012-09-19 LAB — LDL CHOLESTEROL, DIRECT: Direct LDL: 110.3 mg/dL

## 2012-09-19 LAB — BASIC METABOLIC PANEL
BUN: 20 mg/dL (ref 6–23)
CO2: 21 mEq/L (ref 19–32)
Glucose, Bld: 126 mg/dL — ABNORMAL HIGH (ref 70–99)
Potassium: 3.6 mEq/L (ref 3.5–5.1)
Sodium: 137 mEq/L (ref 135–145)

## 2012-09-19 LAB — LIPID PANEL: Triglycerides: 233 mg/dL — ABNORMAL HIGH (ref 0.0–149.0)

## 2012-09-19 LAB — HEPATIC FUNCTION PANEL: Albumin: 4.2 g/dL (ref 3.5–5.2)

## 2012-09-19 LAB — TSH: TSH: 1.53 u[IU]/mL (ref 0.35–5.50)

## 2012-09-26 ENCOUNTER — Encounter: Payer: Managed Care, Other (non HMO) | Admitting: Internal Medicine

## 2012-10-16 ENCOUNTER — Other Ambulatory Visit: Payer: Self-pay | Admitting: Internal Medicine

## 2012-10-30 ENCOUNTER — Other Ambulatory Visit (HOSPITAL_COMMUNITY)
Admission: RE | Admit: 2012-10-30 | Discharge: 2012-10-30 | Disposition: A | Discharge status: Home / Self Care | Payer: Managed Care, Other (non HMO) | Source: Ambulatory Visit | Attending: Family | Admitting: Family

## 2012-10-30 ENCOUNTER — Encounter: Payer: Self-pay | Admitting: Family

## 2012-10-30 ENCOUNTER — Ambulatory Visit (INDEPENDENT_AMBULATORY_CARE_PROVIDER_SITE_OTHER): Payer: Managed Care, Other (non HMO) | Admitting: Family

## 2012-10-30 VITALS — BP 152/100 | HR 96 | Ht 62.75 in | Wt 226.0 lb

## 2012-10-30 DIAGNOSIS — R739 Hyperglycemia, unspecified: Secondary | ICD-10-CM

## 2012-10-30 DIAGNOSIS — R7309 Other abnormal glucose: Secondary | ICD-10-CM

## 2012-10-30 DIAGNOSIS — Z124 Encounter for screening for malignant neoplasm of cervix: Secondary | ICD-10-CM

## 2012-10-30 DIAGNOSIS — Z01419 Encounter for gynecological examination (general) (routine) without abnormal findings: Secondary | ICD-10-CM | POA: Insufficient documentation

## 2012-10-30 DIAGNOSIS — M25369 Other instability, unspecified knee: Secondary | ICD-10-CM

## 2012-10-30 DIAGNOSIS — Z1231 Encounter for screening mammogram for malignant neoplasm of breast: Secondary | ICD-10-CM

## 2012-10-30 DIAGNOSIS — F32A Depression, unspecified: Secondary | ICD-10-CM

## 2012-10-30 DIAGNOSIS — E7800 Pure hypercholesterolemia, unspecified: Secondary | ICD-10-CM

## 2012-10-30 DIAGNOSIS — F329 Major depressive disorder, single episode, unspecified: Secondary | ICD-10-CM

## 2012-10-30 DIAGNOSIS — Z0000 Encounter for general adult medical examination without abnormal findings: Secondary | ICD-10-CM

## 2012-10-30 DIAGNOSIS — M238X9 Other internal derangements of unspecified knee: Secondary | ICD-10-CM

## 2012-10-30 DIAGNOSIS — Z23 Encounter for immunization: Secondary | ICD-10-CM

## 2012-10-30 MED ORDER — SIMVASTATIN 20 MG PO TABS: 20.0000 mg | ORAL_TABLET | Freq: Every day | ORAL | Status: DC

## 2012-10-30 MED ORDER — TAPENTADOL HCL 75 MG PO TABS: 75.0000 mg | ORAL_TABLET | Freq: Two times a day (BID) | ORAL | Status: DC

## 2012-10-30 MED ORDER — TIZANIDINE HCL 4 MG PO TABS: 4.0000 mg | ORAL_TABLET | Freq: Three times a day (TID) | ORAL | Status: DC | PRN

## 2012-10-30 MED ORDER — TRAMADOL HCL 50 MG PO TABS: 50.0000 mg | ORAL_TABLET | Freq: Four times a day (QID) | ORAL | Status: DC | PRN

## 2012-10-30 MED ORDER — DIAZEPAM 2 MG PO TABS: 2.0000 mg | ORAL_TABLET | Freq: Two times a day (BID) | ORAL | Status: DC | PRN

## 2012-10-30 MED ORDER — VALACYCLOVIR HCL 500 MG PO TABS: 500.0000 mg | ORAL_TABLET | Freq: Every day | ORAL | Status: DC | PRN

## 2012-10-30 MED ORDER — CITALOPRAM HYDROBROMIDE 20 MG PO TABS: 20.0000 mg | ORAL_TABLET | Freq: Every day | ORAL | Status: DC

## 2012-10-30 NOTE — Progress Notes (Signed)
Subjective:    Patient ID: Rose Rose, female    DOB: 1956/05/23, 57 y.o.   MRN: [id number]  HPI This is a routine physical examination for this healthy  Female. Reviewed all health maintenance protocols including mammography colonoscopy bone density and reviewed appropriate screening labs. Her immunization history was reviewed as well as her current medications and allergies refills of her chronic medications were given and the plan for yearly health maintenance was discussed all orders and referrals were made as appropriate.   Review of Systems  Constitutional: Negative.   HENT: Negative.   Eyes: Negative.   Respiratory: Negative.   Cardiovascular: Negative.   Gastrointestinal: Negative.   Genitourinary: Negative.   Musculoskeletal: Negative.   Skin: Negative.   Neurological: Negative.   Hematological: Negative.   Psychiatric/Behavioral: Negative.    Past Medical History  Diagnosis Date  . Hyperlipidemia   . Hypertension   . Arthritis     History   Social History  . Marital Status: Married    Spouse Name: N/A    Number of Children: N/A  . Years of Education: N/A   Occupational History  . Not on file.   Social History Main Topics  . Smoking status: Former Smoker    Quit date: 10/22/2002  . Smokeless tobacco: Never Used  . Alcohol Use: No  . Drug Use: No  . Sexually Active: Not on file   Other Topics Concern  . Not on file   Social History Narrative  . No narrative on file    Past Surgical History  Procedure Date  . Tonsillectomy   . Myringotomy     Right ear  . Total knee arthroplasty 05/2011    Dr. Despina Hick    Family History  Problem Relation Age of Onset  . Alzheimer's disease Maternal Aunt   . Arthritis Mother   . Vision loss Mother   . Dementia Father   . Parkinsonism Father     Allergies  Allergen Reactions  . Amoxicillin-Pot Clavulanate     REACTION: swelling    Current Outpatient Prescriptions on File Prior to Visit  Medication Sig  Dispense Refill  . Ascorbic Acid (VITAMIN C) 100 MG tablet Take 100 mg by mouth daily.        . Black Cohosh (REMIFEMIN) 20 MG TABS Take by mouth 2 (two) times daily.        . citalopram (CELEXA) 20 MG tablet Take 1 tablet (20 mg total) by mouth daily.  90 tablet  3  . diphenhydrAMINE (SOMINEX) 25 MG tablet Take 25 mg by mouth at bedtime as needed.        . fish oil-omega-3 fatty acids 1000 MG capsule Take 2 g by mouth 2 (two) times daily.        . folic acid (FOLVITE) 1 MG tablet Take 1 mg by mouth daily.        Marland Kitchen pyridoxine (B-6) 100 MG tablet Take 100 mg by mouth daily.        . simvastatin (ZOCOR) 20 MG tablet Take 1 tablet (20 mg total) by mouth daily.  90 tablet  3  . etodolac (LODINE) 300 MG capsule Take 1 capsule (300 mg total) by mouth every 8 (eight) hours.  90 capsule  11  . neomycin-polymyxin-hydrocortisone (CORTISPORIN) 3.5-10000-1 otic suspension Place 3 drops into the right ear 4 (four) times daily.  10 mL  0    BP 152/100  Pulse 96  Ht 5' 2.75" (1.594 m)  Wt 226  lb (102.513 kg)  BMI 40.35 kg/m2  SpO2 98%chart    Objective:   Physical Exam  Constitutional: She is oriented to person, place, and time. She appears well-developed and well-nourished.  HENT:  Head: Normocephalic and atraumatic.  Right Ear: External ear normal.  Left Ear: External ear normal.  Nose: Nose normal.  Mouth/Throat: Oropharynx is clear and moist.  Eyes: Conjunctivae normal and EOM are normal. Pupils are equal, round, and reactive to light.  Neck: Normal range of motion. Neck supple. No thyromegaly present.  Cardiovascular: Normal rate, regular rhythm and normal heart sounds.  Exam reveals no gallop and no friction rub.   No murmur heard. Pulmonary/Chest: Effort normal and breath sounds normal.  Abdominal: Soft. Bowel sounds are normal.  Genitourinary: Vagina normal and uterus normal. Guaiac negative stool. No vaginal discharge found.  Musculoskeletal: Normal range of motion.  Neurological: She  is alert and oriented to person, place, and time. She has normal reflexes. She displays normal reflexes.  Skin: Skin is warm and dry.  Psychiatric: She has a normal mood and affect.          Assessment & Plan:  Assessment: Complete physical exam, hyperglycemia, depression, anxiety  Plan: Pap smear sent. Discussed labs. Mammogram ordered. Refer for colonoscopy screening. Tdap administered. Encouraged healthy diet and exercise. Self breast exams. Low-carb diet. Patient will recheck in 4 months and sooner as needed. A1c added to labs.

## 2012-10-30 NOTE — Patient Instructions (Signed)

## 2012-11-25 ENCOUNTER — Ambulatory Visit
Admission: RE | Admit: 2012-11-25 | Discharge: 2012-11-25 | Disposition: A | Discharge status: Home / Self Care | Payer: Managed Care, Other (non HMO) | Source: Ambulatory Visit | Attending: Family | Admitting: Family

## 2012-11-25 DIAGNOSIS — Z1231 Encounter for screening mammogram for malignant neoplasm of breast: Secondary | ICD-10-CM

## 2013-01-13 ENCOUNTER — Other Ambulatory Visit: Payer: Self-pay | Admitting: Family

## 2013-01-30 ENCOUNTER — Telehealth: Payer: Self-pay | Admitting: Internal Medicine

## 2013-01-30 MED ORDER — TAPENTADOL HCL 75 MG PO TABS: 75.0000 mg | ORAL_TABLET | Freq: Two times a day (BID) | ORAL | Status: DC

## 2013-01-30 NOTE — Telephone Encounter (Signed)
PT requesting refill of Tapentadol HCl (NUCYNTA) 75 MG TABS. Please assist.

## 2013-01-30 NOTE — Telephone Encounter (Signed)
Printed and will call pt to pick up wh en ready  

## 2013-03-17 ENCOUNTER — Other Ambulatory Visit: Payer: Self-pay | Admitting: Internal Medicine

## 2013-03-17 ENCOUNTER — Other Ambulatory Visit: Payer: Self-pay | Admitting: Family

## 2013-05-04 ENCOUNTER — Telehealth: Payer: Self-pay | Admitting: Internal Medicine

## 2013-05-04 MED ORDER — TAPENTADOL HCL 75 MG PO TABS: 75.0000 mg | ORAL_TABLET | Freq: Two times a day (BID) | ORAL | Status: DC

## 2013-05-04 NOTE — Telephone Encounter (Signed)
Pt requesting refill of Tapentadol HCl (NUCYNTA) 75 MG TABS please call when ready to pick up.

## 2013-05-04 NOTE — Telephone Encounter (Signed)
Printed and will call pt to pick up after dr jenkins signs 

## 2013-06-01 ENCOUNTER — Other Ambulatory Visit: Payer: Self-pay | Admitting: Internal Medicine

## 2013-06-04 ENCOUNTER — Other Ambulatory Visit: Payer: Self-pay | Admitting: *Deleted

## 2013-06-04 MED ORDER — DIAZEPAM 2 MG PO TABS: 2.0000 mg | ORAL_TABLET | Freq: Two times a day (BID) | ORAL | Status: DC | PRN

## 2013-07-16 ENCOUNTER — Encounter: Payer: Self-pay | Admitting: Family

## 2013-07-16 ENCOUNTER — Ambulatory Visit (INDEPENDENT_AMBULATORY_CARE_PROVIDER_SITE_OTHER): Payer: Managed Care, Other (non HMO) | Admitting: Family

## 2013-07-16 ENCOUNTER — Telehealth: Payer: Self-pay | Admitting: Internal Medicine

## 2013-07-16 VITALS — BP 150/100 | HR 75 | Wt 235.0 lb

## 2013-07-16 DIAGNOSIS — M79672 Pain in left foot: Secondary | ICD-10-CM

## 2013-07-16 DIAGNOSIS — M79609 Pain in unspecified limb: Secondary | ICD-10-CM

## 2013-07-16 DIAGNOSIS — F411 Generalized anxiety disorder: Secondary | ICD-10-CM

## 2013-07-16 MED ORDER — CITALOPRAM HYDROBROMIDE 40 MG PO TABS: 40.0000 mg | ORAL_TABLET | Freq: Every day | ORAL | Status: DC

## 2013-07-16 NOTE — Progress Notes (Signed)
Subjective:    Patient ID: Rose Rose, female    DOB: [birth year], 57 y.o.   MRN: [id number]  HPI 57 year old white female, nonsmoker, patient of Dr. Lovell Sheehan in today with complaints of left foot swelling after cycling several days ago. She doesn't typically cycle but has been trying to be more physically active. Has been taking ibuprofen it does help. Denies any acute injury rates the pain 5/10 and worse with walking.  Also has concerns of increased anxiety related to work stress.  Would like to have medication adjusted.    Review of Systems  Constitutional: Negative.   Respiratory: Negative.   Cardiovascular: Negative.   Gastrointestinal: Negative.   Genitourinary: Negative.   Musculoskeletal: Positive for arthralgias.       Left foot pain.   Skin: Negative.   Allergic/Immunologic: Negative.   Neurological: Negative.   Psychiatric/Behavioral: The patient is nervous/anxious.    Past Medical History  Diagnosis Date  . Hyperlipidemia   . Hypertension   . Arthritis     History   Social History  . Marital Status: Married    Spouse Name: N/A    Number of Children: N/A  . Years of Education: N/A   Occupational History  . Not on file.   Social History Main Topics  . Smoking status: Former Smoker    Quit date: 10/22/2002  . Smokeless tobacco: Never Used  . Alcohol Use: No  . Drug Use: No  . Sexual Activity: Not on file   Other Topics Concern  . Not on file   Social History Narrative  . No narrative on file    Past Surgical History  Procedure Laterality Date  . Tonsillectomy    . Myringotomy      Right ear  . Total knee arthroplasty  05/2011    Dr. Despina Hick    Family History  Problem Relation Age of Onset  . Alzheimer's disease Maternal Aunt   . Arthritis Mother   . Vision loss Mother   . Dementia Father   . Parkinsonism Father     Allergies  Allergen Reactions  . Amoxicillin-Pot Clavulanate     REACTION: swelling    Current Outpatient  Prescriptions on File Prior to Visit  Medication Sig Dispense Refill  . Black Cohosh (REMIFEMIN) 20 MG TABS Take by mouth 2 (two) times daily.        . diazepam (VALIUM) 2 MG tablet Take 1 tablet (2 mg total) by mouth every 12 (twelve) hours as needed for anxiety.  180 tablet  2  . simvastatin (ZOCOR) 20 MG tablet Take 1 tablet (20 mg total) by mouth daily.  90 tablet  3  . Tapentadol HCl (NUCYNTA) 75 MG TABS Take 1 tablet (75 mg total) by mouth 2 (two) times daily.  60 tablet  0  . Ascorbic Acid (VITAMIN C) 100 MG tablet Take 100 mg by mouth daily.        . diphenhydrAMINE (SOMINEX) 25 MG tablet Take 25 mg by mouth at bedtime as needed.        . fish oil-omega-3 fatty acids 1000 MG capsule Take 2 g by mouth 2 (two) times daily.        . folic acid (FOLVITE) 1 MG tablet Take 1 mg by mouth daily.        Marland Kitchen neomycin-polymyxin-hydrocortisone (CORTISPORIN) 3.5-10000-1 otic suspension Place 3 drops into the right ear 4 (four) times daily.  10 mL  0  . pyridoxine (B-6) 100 MG tablet Take 100  mg by mouth daily.        Marland Kitchen tiZANidine (ZANAFLEX) 4 MG tablet TAKE 1 TABLET EVERY 8 HOURSAS NEEDED  90 tablet  0  . traMADol (ULTRAM) 50 MG tablet TAKE 1 TABLET EVERY 6 HOURSAS NEEDED FOR PAIN  180 tablet  0  . valACYclovir (VALTREX) 500 MG tablet TAKE 1 TABLET DAILY AS     NEEDED  90 tablet  0   No current facility-administered medications on file prior to visit.    BP 150/100  Pulse 75  Wt 235 lb (106.595 kg)  BMI 41.95 kg/m2chart    Objective:   Physical Exam  Constitutional: She is oriented to person, place, and time. She appears well-developed and well-nourished.  Neck: Normal range of motion. Neck supple.  Cardiovascular: Normal rate, regular rhythm and normal heart sounds.   Pulmonary/Chest: Effort normal.  Musculoskeletal: She exhibits tenderness.  Left lateral foot pain to palpation. No active swelling. No redness. Full ROM no pain.   Neurological: She is alert and oriented to person, place, and  time.  Skin: Skin is warm and dry.  Psychiatric: She has a normal mood and affect.          Assessment & Plan:  Assessment:  1. Left foot pain  2. Anxiety  Plan: RICE, call the office with any questions or concerns. Increase Celexa to 40mg  once daily.

## 2013-07-16 NOTE — Telephone Encounter (Signed)
Pt would like to switch from Dr. Lovell Sheehan to Adline Mango as PCP.  Please advise if this is okay.   Thanks,  Archie Patten

## 2013-07-16 NOTE — Telephone Encounter (Signed)
It is more than fine She is a pleasant patient to take great care of

## 2013-07-16 NOTE — Patient Instructions (Addendum)
Foot Sprain °The muscles and cord like structures which attach muscle to bone (tendons) that surround the feet are made up of units. A foot sprain can occur at the weakest spot in any of these units. This condition is most often caused by injury to or overuse of the foot, as from playing contact sports, or aggravating a previous injury, or from poor conditioning, or obesity. °SYMPTOMS °· Pain with movement of the foot. °· Tenderness and swelling at the injury site. °· Loss of strength is present in moderate or severe sprains. °THE THREE GRADES OR SEVERITY OF FOOT SPRAIN ARE: °· Mild (Grade I): Slightly pulled muscle without tearing of muscle or tendon fibers or loss of strength. °· Moderate (Grade II): Tearing of fibers in a muscle, tendon, or at the attachment to bone, with small decrease in strength. °· Severe (Grade III): Rupture of the muscle-tendon-bone attachment, with separation of fibers. Severe sprain requires surgical repair. Often repeating (chronic) sprains are caused by overuse. Sudden (acute) sprains are caused by direct injury or over-use. °DIAGNOSIS  °Diagnosis of this condition is usually by your own observation. If problems continue, a caregiver may be required for further evaluation and treatment. X-rays may be required to make sure there are not breaks in the bones (fractures) present. Continued problems may require physical therapy for treatment. °PREVENTION °· Use strength and conditioning exercises appropriate for your sport. °· Warm up properly prior to working out. °· Use athletic shoes that are made for the sport you are participating in. °· Allow adequate time for healing. Early return to activities makes repeat injury more likely, and can lead to an unstable arthritic foot that can result in prolonged disability. Mild sprains generally heal in 3 to 10 days, with moderate and severe sprains taking 2 to 10 weeks. Your caregiver can help you determine the proper time required for  healing. °HOME CARE INSTRUCTIONS  °· Apply ice to the injury for 15-20 minutes, 3-4 times per day. Put the ice in a plastic bag and place a towel between the bag of ice and your skin. °· An elastic wrap (like an Ace bandage) may be used to keep swelling down. °· Keep foot above the level of the heart, or at least raised on a footstool, when swelling and pain are present. °· Try to avoid use other than gentle range of motion while the foot is painful. Do not resume use until instructed by your caregiver. Then begin use gradually, not increasing use to the point of pain. If pain does develop, decrease use and continue the above measures, gradually increasing activities that do not cause discomfort, until you gradually achieve normal use. °· Use crutches if and as instructed, and for the length of time instructed. °· Keep injured foot and ankle wrapped between treatments. °· Massage foot and ankle for comfort and to keep swelling down. Massage from the toes up towards the knee. °· Only take over-the-counter or prescription medicines for pain, discomfort, or fever as directed by your caregiver. °SEEK IMMEDIATE MEDICAL CARE IF:  °· Your pain and swelling increase, or pain is not controlled with medications. °· You have loss of feeling in your foot or your foot turns cold or blue. °· You develop new, unexplained symptoms, or an increase of the symptoms that brought you to your caregiver. °MAKE SURE YOU:  °· Understand these instructions. °· Will watch your condition. °· Will get help right away if you are not doing well or get worse. °Document Released:   03/30/2002 Document Revised: 12/31/2011 Document Reviewed: 05/27/2008 °Exit[employer]® Patient Information ©2014 [employer]. ° °

## 2013-07-16 NOTE — Telephone Encounter (Signed)
Ok with me 

## 2013-08-14 ENCOUNTER — Ambulatory Visit (INDEPENDENT_AMBULATORY_CARE_PROVIDER_SITE_OTHER): Payer: Managed Care, Other (non HMO) | Admitting: Family

## 2013-08-14 ENCOUNTER — Encounter: Payer: Self-pay | Admitting: Family

## 2013-08-14 VITALS — BP 152/100 | HR 83 | Wt 235.0 lb

## 2013-08-14 DIAGNOSIS — H6690 Otitis media, unspecified, unspecified ear: Secondary | ICD-10-CM

## 2013-08-14 DIAGNOSIS — I10 Essential (primary) hypertension: Secondary | ICD-10-CM

## 2013-08-14 DIAGNOSIS — F411 Generalized anxiety disorder: Secondary | ICD-10-CM

## 2013-08-14 DIAGNOSIS — H6691 Otitis media, unspecified, right ear: Secondary | ICD-10-CM

## 2013-08-14 MED ORDER — HYDROCHLOROTHIAZIDE 25 MG PO TABS: 12.5000 mg | ORAL_TABLET | Freq: Every day | ORAL | Status: DC

## 2013-08-14 MED ORDER — CEFDINIR 300 MG PO CAPS: 300.0000 mg | ORAL_CAPSULE | Freq: Two times a day (BID) | ORAL | Status: DC

## 2013-08-14 NOTE — Progress Notes (Signed)
Subjective:    Patient ID: Rose Rose, female    DOB: May 26, 1956, 57 y.o.   MRN: [id number]  HPI  57 year old white female is in for recheck of anxiety. She was increased to 40 mg of Celexa once a day. She's tolerating the medication well. Reports decreased anxiety levels. She is much happier now. She continues to have a blood pressure around 150/100. She's not currently exercising. His blood pressure medication in the past but been unable to tolerate it. She is not sure of what she's taken in the past.  Review of Systems  Constitutional: Negative.   HENT: Negative.   Respiratory: Negative.   Cardiovascular: Negative.   Gastrointestinal: Negative.   Endocrine: Negative.   Genitourinary: Negative.   Musculoskeletal: Negative.   Skin: Negative.   Neurological: Negative.   Hematological: Negative.   Psychiatric/Behavioral: Negative.    Past Medical History  Diagnosis Date  . Hyperlipidemia   . Hypertension   . Arthritis     History   Social History  . Marital Status: Married    Spouse Name: N/A    Number of Children: N/A  . Years of Education: N/A   Occupational History  . Not on file.   Social History Main Topics  . Smoking status: Former Smoker    Quit date: 10/22/2002  . Smokeless tobacco: Never Used  . Alcohol Use: No  . Drug Use: No  . Sexual Activity: Not on file   Other Topics Concern  . Not on file   Social History Narrative  . No narrative on file    Past Surgical History  Procedure Laterality Date  . Tonsillectomy    . Myringotomy      Right ear  . Total knee arthroplasty  05/2011    Dr. Despina Hick    Family History  Problem Relation Age of Onset  . Alzheimer's disease Maternal Aunt   . Arthritis Mother   . Vision loss Mother   . Dementia Father   . Parkinsonism Father     Allergies  Allergen Reactions  . Amoxicillin-Pot Clavulanate     REACTION: swelling    Current Outpatient Prescriptions on File Prior to Visit  Medication Sig  Dispense Refill  . Ascorbic Acid (VITAMIN C) 100 MG tablet Take 100 mg by mouth daily.        . Black Cohosh (REMIFEMIN) 20 MG TABS Take by mouth 2 (two) times daily.        . citalopram (CELEXA) 40 MG tablet Take 1 tablet (40 mg total) by mouth daily.  30 tablet  1  . diazepam (VALIUM) 2 MG tablet Take 1 tablet (2 mg total) by mouth every 12 (twelve) hours as needed for anxiety.  180 tablet  2  . diphenhydrAMINE (SOMINEX) 25 MG tablet Take 25 mg by mouth at bedtime as needed.        . fish oil-omega-3 fatty acids 1000 MG capsule Take 2 g by mouth 2 (two) times daily.        . folic acid (FOLVITE) 1 MG tablet Take 1 mg by mouth daily.        Marland Kitchen neomycin-polymyxin-hydrocortisone (CORTISPORIN) 3.5-10000-1 otic suspension Place 3 drops into the right ear 4 (four) times daily.  10 mL  0  . pyridoxine (B-6) 100 MG tablet Take 100 mg by mouth daily.        . simvastatin (ZOCOR) 20 MG tablet Take 1 tablet (20 mg total) by mouth daily.  90 tablet  3  . Tapentadol HCl (NUCYNTA) 75 MG TABS Take 1 tablet (75 mg total) by mouth 2 (two) times daily.  60 tablet  0  . tiZANidine (ZANAFLEX) 4 MG tablet TAKE 1 TABLET EVERY 8 HOURSAS NEEDED  90 tablet  0  . traMADol (ULTRAM) 50 MG tablet TAKE 1 TABLET EVERY 6 HOURSAS NEEDED FOR PAIN  180 tablet  0  . valACYclovir (VALTREX) 500 MG tablet TAKE 1 TABLET DAILY AS     NEEDED  90 tablet  0   No current facility-administered medications on file prior to visit.    BP 152/100  Pulse 83  Wt 235 lb (106.595 kg)  BMI 41.95 kg/m2chart    Objective:   Physical Exam  Constitutional: She is oriented to person, place, and time. She appears well-developed and well-nourished.  HENT:  Right Ear: External ear normal.  Left Ear: External ear normal.  Nose: Nose normal.  Mouth/Throat: Oropharynx is clear and moist.  Neck: Normal range of motion. Neck supple. No thyromegaly present.  Cardiovascular: Normal rate, regular rhythm and normal heart sounds.   Pulmonary/Chest:  Effort normal and breath sounds normal.  152/100  Abdominal: Soft. Bowel sounds are normal.  Musculoskeletal: Normal range of motion.  Neurological: She is oriented to person, place, and time.  Skin: Skin is warm and dry.  Psychiatric: She has a normal mood and affect.          Assessment & Plan:  Assessment: 1. Anxiety 2. Hypertension-uncontrolled  Plan: Start hydrochlorothiazide one half tablet once a day. Recheck in 4 weeks. Continue Celexa 40 mg once daily. Patient called the office with any questions or concerns. Recheck as scheduled, and as needed.

## 2013-08-14 NOTE — Patient Instructions (Signed)

## 2013-08-27 ENCOUNTER — Encounter: Payer: Self-pay | Admitting: Family

## 2013-08-27 ENCOUNTER — Ambulatory Visit (INDEPENDENT_AMBULATORY_CARE_PROVIDER_SITE_OTHER): Payer: Managed Care, Other (non HMO) | Admitting: Family

## 2013-08-27 VITALS — BP 140/84 | HR 72 | Wt 235.0 lb

## 2013-08-27 DIAGNOSIS — H6690 Otitis media, unspecified, unspecified ear: Secondary | ICD-10-CM

## 2013-08-27 DIAGNOSIS — H6691 Otitis media, unspecified, right ear: Secondary | ICD-10-CM

## 2013-08-27 DIAGNOSIS — H9201 Otalgia, right ear: Secondary | ICD-10-CM

## 2013-08-27 DIAGNOSIS — H9209 Otalgia, unspecified ear: Secondary | ICD-10-CM

## 2013-08-27 MED ORDER — SULFAMETHOXAZOLE-TRIMETHOPRIM 800-160 MG PO TABS: 1.0000 | ORAL_TABLET | Freq: Two times a day (BID) | ORAL | Status: AC

## 2013-08-27 NOTE — Progress Notes (Signed)
Subjective:    Patient ID: Rose Rose, female    DOB: Dec 07, 1955, 57 y.o.   MRN: [id number]  HPI 57 year old white female, nonsmoker is in today with a recurrent right otitis media. She's had chronic issues with her right ear dating back to age 57. She's had tubes placed in the past. She was recently treated with [facility] for otitis media approximately 2-1/2 weeks ago. She now reports ear pain, no swelling, dizziness, sneezing denies any cough or congestion x2 days. Has not taken any medication for relief.   Review of Systems  Constitutional: Negative.   HENT: Positive for ear pain. Negative for ear discharge and sinus pressure.   Respiratory: Negative.   Cardiovascular: Negative.   Gastrointestinal: Negative.   Skin: Negative.   Neurological: Positive for dizziness. Negative for light-headedness and numbness.  Psychiatric/Behavioral: Negative.    Past Medical History  Diagnosis Date  . Hyperlipidemia   . Hypertension   . Arthritis     History   Social History  . Marital Status: Married    Spouse Name: N/A    Number of Children: N/A  . Years of Education: N/A   Occupational History  . Not on file.   Social History Main Topics  . Smoking status: Former Smoker    Quit date: 10/22/2002  . Smokeless tobacco: Never Used  . Alcohol Use: No  . Drug Use: No  . Sexual Activity: Not on file   Other Topics Concern  . Not on file   Social History Narrative  . No narrative on file    Past Surgical History  Procedure Laterality Date  . Tonsillectomy    . Myringotomy      Right ear  . Total knee arthroplasty  05/2011    Dr. Despina Hick    Family History  Problem Relation Age of Onset  . Alzheimer's disease Maternal Aunt   . Arthritis Mother   . Vision loss Mother   . Dementia Father   . Parkinsonism Father     Allergies  Allergen Reactions  . Amoxicillin-Pot Clavulanate     REACTION: swelling    Current Outpatient Prescriptions on File Prior to Visit  Medication  Sig Dispense Refill  . Ascorbic Acid (VITAMIN C) 100 MG tablet Take 100 mg by mouth daily.        . Black Cohosh (REMIFEMIN) 20 MG TABS Take by mouth 2 (two) times daily.        . cefdinir (OMNICEF) 300 MG capsule Take 1 capsule (300 mg total) by mouth 2 (two) times daily.  20 capsule  0  . citalopram (CELEXA) 40 MG tablet Take 1 tablet (40 mg total) by mouth daily.  30 tablet  1  . diazepam (VALIUM) 2 MG tablet Take 1 tablet (2 mg total) by mouth every 12 (twelve) hours as needed for anxiety.  180 tablet  2  . diphenhydrAMINE (SOMINEX) 25 MG tablet Take 25 mg by mouth at bedtime as needed.        . fish oil-omega-3 fatty acids 1000 MG capsule Take 2 g by mouth 2 (two) times daily.        . folic acid (FOLVITE) 1 MG tablet Take 1 mg by mouth daily.        . hydrochlorothiazide (HYDRODIURIL) 25 MG tablet Take 0.5-1 tablets (12.5-25 mg total) by mouth daily.  30 tablet  3  . neomycin-polymyxin-hydrocortisone (CORTISPORIN) 3.5-10000-1 otic suspension Place 3 drops into the right ear 4 (four) times daily.  10 mL  0  . pyridoxine (B-6) 100 MG tablet Take 100 mg by mouth daily.        . simvastatin (ZOCOR) 20 MG tablet Take 1 tablet (20 mg total) by mouth daily.  90 tablet  3  . Tapentadol HCl (NUCYNTA) 75 MG TABS Take 1 tablet (75 mg total) by mouth 2 (two) times daily.  60 tablet  0  . tiZANidine (ZANAFLEX) 4 MG tablet TAKE 1 TABLET EVERY 8 HOURSAS NEEDED  90 tablet  0  . traMADol (ULTRAM) 50 MG tablet TAKE 1 TABLET EVERY 6 HOURSAS NEEDED FOR PAIN  180 tablet  0  . valACYclovir (VALTREX) 500 MG tablet TAKE 1 TABLET DAILY AS     NEEDED  90 tablet  0   No current facility-administered medications on file prior to visit.    BP 140/84  Pulse 72  Wt 235 lb (106.595 kg)chart    Objective:   Physical Exam  Constitutional: She is oriented to person, place, and time. She appears well-developed and well-nourished.  HENT:  Left Ear: External ear normal.  Nose: Nose normal.  Mouth/Throat: Oropharynx  is clear and moist.  Right tympanic membrane red, raw appearing. Canal is normal  Neck: Normal range of motion. Neck supple.  Cardiovascular: Normal rate, regular rhythm and normal heart sounds.   Pulmonary/Chest: Breath sounds normal.  Musculoskeletal: Normal range of motion.  Neurological: She is alert and oriented to person, place, and time.  Skin: Skin is warm and dry.  Psychiatric: She has a normal mood and affect.          Assessment & Plan:  Assessment: 1. Otitis Media-Right 2. Otalgia  Plan: Bactrim DS 1 tab twice a day x 7 days. Call the office if symptoms worsen or persist. Recheck as scheduled and as needed.

## 2013-09-11 ENCOUNTER — Encounter: Payer: Self-pay | Admitting: Family

## 2013-09-14 ENCOUNTER — Encounter: Payer: Self-pay | Admitting: Family

## 2013-09-14 ENCOUNTER — Ambulatory Visit (INDEPENDENT_AMBULATORY_CARE_PROVIDER_SITE_OTHER): Payer: Managed Care, Other (non HMO) | Admitting: Family

## 2013-09-14 VITALS — BP 128/78 | HR 69 | Wt 240.0 lb

## 2013-09-14 DIAGNOSIS — H9209 Otalgia, unspecified ear: Secondary | ICD-10-CM

## 2013-09-14 DIAGNOSIS — H8110 Benign paroxysmal vertigo, unspecified ear: Secondary | ICD-10-CM

## 2013-09-14 DIAGNOSIS — H9201 Otalgia, right ear: Secondary | ICD-10-CM

## 2013-09-14 NOTE — Progress Notes (Signed)
Pre visit review using our clinic review tool, if applicable. No additional management support is needed unless otherwise documented below in the visit note. 

## 2013-09-14 NOTE — Patient Instructions (Signed)
Vertigo Vertigo means you feel like you or your surroundings are moving when they are not. Vertigo can be dangerous if it occurs when you are at work, driving, or performing difficult activities.  CAUSES  Vertigo occurs when there is a conflict of signals sent to your brain from the visual and sensory systems in your body. There are many different causes of vertigo, including:  Infections, especially in the inner ear.  A bad reaction to a drug or misuse of alcohol and medicines.  Withdrawal from drugs or alcohol.  Rapidly changing positions, such as lying down or rolling over in bed.  A migraine headache.  Decreased blood flow to the brain.  Increased pressure in the brain from a head injury, infection, tumor, or bleeding. SYMPTOMS  You may feel as though the world is spinning around or you are falling to the ground. Because your balance is upset, vertigo can cause nausea and vomiting. You may have involuntary eye movements (nystagmus). DIAGNOSIS  Vertigo is usually diagnosed by physical exam. If the cause of your vertigo is unknown, your caregiver may perform imaging tests, such as an MRI scan (magnetic resonance imaging). TREATMENT  Most cases of vertigo resolve on their own, without treatment. Depending on the cause, your caregiver may prescribe certain medicines. If your vertigo is related to body position issues, your caregiver may recommend movements or procedures to correct the problem. In rare cases, if your vertigo is caused by certain inner ear problems, you may need surgery. HOME CARE INSTRUCTIONS   Follow your caregiver's instructions.  Avoid driving.  Avoid operating heavy machinery.  Avoid performing any tasks that would be dangerous to you or others during a vertigo episode.  Tell your caregiver if you notice that certain medicines seem to be causing your vertigo. Some of the medicines used to treat vertigo episodes can actually make them worse in some people. SEEK  IMMEDIATE MEDICAL CARE IF:   Your medicines do not relieve your vertigo or are making it worse.  You develop problems with talking, walking, weakness, or using your arms, hands, or legs.  You develop severe headaches.  Your nausea or vomiting continues or gets worse.  You develop visual changes.  A family member notices behavioral changes.  Your condition gets worse. MAKE SURE YOU:  Understand these instructions.  Will watch your condition.  Will get help right away if you are not doing well or get worse. Document Released: 07/18/2005 Document Revised: 12/31/2011 Document Reviewed: 04/26/2011 [employer] Patient Information 2014 [employer].  

## 2013-09-14 NOTE — Progress Notes (Signed)
Subjective:    Patient ID: Rose Rose, female    DOB: Aug 09, 1956, 57 y.o.   MRN: [id number]  HPI 57 year old white female, nonsmoker with a history of chronic right ear issues x 25+ years. On 1024 for change she was treated with Omnicef for otitis media of the right ear. She returned on 08/27/2013 and was treated with Bactrim. Today, eye ear is better but she complains of dizziness with position change. Has sneezing, coughing and congestion. She is not currently taking anything. Her last consult with ENT was 5-8 years ago.   Review of Systems  Constitutional: Negative.   HENT: Positive for congestion and ear pain. Negative for ear discharge.        Right ear discomfort at times.  Respiratory: Negative.   Cardiovascular: Negative.   Musculoskeletal: Negative.   Skin: Negative.   Allergic/Immunologic: Positive for environmental allergies.  Psychiatric/Behavioral: Negative.    Past Medical History  Diagnosis Date  . Hyperlipidemia   . Hypertension   . Arthritis     History   Social History  . Marital Status: Married    Spouse Name: N/A    Number of Children: N/A  . Years of Education: N/A   Occupational History  . Not on file.   Social History Main Topics  . Smoking status: Former Smoker    Quit date: 10/22/2002  . Smokeless tobacco: Never Used  . Alcohol Use: No  . Drug Use: No  . Sexual Activity: Not on file   Other Topics Concern  . Not on file   Social History Narrative  . No narrative on file    Past Surgical History  Procedure Laterality Date  . Tonsillectomy    . Myringotomy      Right ear  . Total knee arthroplasty  05/2011    Dr. Despina Hick    Family History  Problem Relation Age of Onset  . Alzheimer's disease Maternal Aunt   . Arthritis Mother   . Vision loss Mother   . Dementia Father   . Parkinsonism Father     Allergies  Allergen Reactions  . Amoxicillin-Pot Clavulanate     REACTION: swelling    Current Outpatient Prescriptions on  File Prior to Visit  Medication Sig Dispense Refill  . Ascorbic Acid (VITAMIN C) 100 MG tablet Take 100 mg by mouth daily.        . Black Cohosh (REMIFEMIN) 20 MG TABS Take by mouth 2 (two) times daily.        . cefdinir (OMNICEF) 300 MG capsule Take 1 capsule (300 mg total) by mouth 2 (two) times daily.  20 capsule  0  . citalopram (CELEXA) 40 MG tablet Take 1 tablet (40 mg total) by mouth daily.  30 tablet  1  . diazepam (VALIUM) 2 MG tablet Take 1 tablet (2 mg total) by mouth every 12 (twelve) hours as needed for anxiety.  180 tablet  2  . diphenhydrAMINE (SOMINEX) 25 MG tablet Take 25 mg by mouth at bedtime as needed.        . fish oil-omega-3 fatty acids 1000 MG capsule Take 2 g by mouth 2 (two) times daily.        . folic acid (FOLVITE) 1 MG tablet Take 1 mg by mouth daily.        . hydrochlorothiazide (HYDRODIURIL) 25 MG tablet Take 0.5-1 tablets (12.5-25 mg total) by mouth daily.  30 tablet  3  . neomycin-polymyxin-hydrocortisone (CORTISPORIN) 3.5-10000-1 otic suspension Place 3 drops into  the right ear 4 (four) times daily.  10 mL  0  . pyridoxine (B-6) 100 MG tablet Take 100 mg by mouth daily.        . simvastatin (ZOCOR) 20 MG tablet Take 1 tablet (20 mg total) by mouth daily.  90 tablet  3  . Tapentadol HCl (NUCYNTA) 75 MG TABS Take 1 tablet (75 mg total) by mouth 2 (two) times daily.  60 tablet  0  . tiZANidine (ZANAFLEX) 4 MG tablet TAKE 1 TABLET EVERY 8 HOURSAS NEEDED  90 tablet  0  . traMADol (ULTRAM) 50 MG tablet TAKE 1 TABLET EVERY 6 HOURSAS NEEDED FOR PAIN  180 tablet  0  . valACYclovir (VALTREX) 500 MG tablet TAKE 1 TABLET DAILY AS     NEEDED  90 tablet  0   No current facility-administered medications on file prior to visit.    BP 128/78  Pulse 69  Wt 240 lb (108.863 kg)chart    Objective:   Physical Exam  Constitutional: She is oriented to person, place, and time. She appears well-developed and well-nourished.  HENT:  Left Ear: External ear normal.  Nose: Nose  normal.  Mouth/Throat: Oropharynx is clear and moist.  Chronic scar tissue noted to the right ear. No active otitis media. No tenderness to palpation. Canals normal.  Neck: Normal range of motion. Neck supple.  Cardiovascular: Normal rate, regular rhythm and normal heart sounds.   Pulmonary/Chest: Effort normal and breath sounds normal.  Neurological: She is alert and oriented to person, place, and time.  Skin: Skin is warm.  Psychiatric: She has a normal mood and affect.          Assessment & Plan:  Assessment: 1. Vertigo 2. Right otalgia  Plan: Refer to ENT for further management. Consider Medrol pack for inflammation. Patient upon the office with any questions or concerns. Recheck as scheduled, and as needed.

## 2013-09-22 ENCOUNTER — Other Ambulatory Visit: Payer: Self-pay | Admitting: Internal Medicine

## 2013-09-23 ENCOUNTER — Other Ambulatory Visit: Payer: Self-pay | Admitting: *Deleted

## 2013-09-23 MED ORDER — TRAMADOL HCL 50 MG PO TABS: ORAL_TABLET | ORAL | Status: DC

## 2013-09-25 ENCOUNTER — Telehealth: Payer: Self-pay | Admitting: Family

## 2013-09-25 MED ORDER — TRAMADOL HCL 50 MG PO TABS: ORAL_TABLET | ORAL | Status: DC

## 2013-09-25 NOTE — Telephone Encounter (Signed)
Need clarification on pt's rx  traMADol (ULTRAM) 50 MG tablet.  FYI On all controlled need to write out the quantity as numeric and word form. Need dr's dea # on all controlled substance

## 2013-10-06 ENCOUNTER — Telehealth: Payer: Self-pay | Admitting: Family

## 2013-10-06 MED ORDER — TAPENTADOL HCL 75 MG PO TABS: 75.0000 mg | ORAL_TABLET | Freq: Two times a day (BID) | ORAL | Status: DC

## 2013-10-06 NOTE — Telephone Encounter (Signed)
done

## 2013-10-06 NOTE — Telephone Encounter (Signed)
Pt needs new rx nucynta 75 mg #60

## 2013-10-06 NOTE — Telephone Encounter (Signed)
Ok to fill 

## 2013-10-06 NOTE — Telephone Encounter (Signed)
Pt aware Rx ready to pick up

## 2013-10-28 ENCOUNTER — Ambulatory Visit (INDEPENDENT_AMBULATORY_CARE_PROVIDER_SITE_OTHER): Payer: Managed Care, Other (non HMO) | Admitting: Family

## 2013-10-28 ENCOUNTER — Encounter: Payer: Self-pay | Admitting: Family

## 2013-10-28 VITALS — BP 120/92 | HR 74 | Temp 98.9°F | Wt 234.0 lb

## 2013-10-28 DIAGNOSIS — H6690 Otitis media, unspecified, unspecified ear: Secondary | ICD-10-CM

## 2013-10-28 DIAGNOSIS — H9201 Otalgia, right ear: Secondary | ICD-10-CM

## 2013-10-28 DIAGNOSIS — H9209 Otalgia, unspecified ear: Secondary | ICD-10-CM

## 2013-10-28 DIAGNOSIS — H6691 Otitis media, unspecified, right ear: Secondary | ICD-10-CM

## 2013-10-28 MED ORDER — SULFAMETHOXAZOLE-TRIMETHOPRIM 800-160 MG PO TABS: 1.0000 | ORAL_TABLET | Freq: Two times a day (BID) | ORAL | Status: AC

## 2013-10-28 MED ORDER — CITALOPRAM HYDROBROMIDE 20 MG PO TABS: 20.0000 mg | ORAL_TABLET | Freq: Two times a day (BID) | ORAL | Status: DC

## 2013-10-28 NOTE — Patient Instructions (Signed)

## 2013-10-28 NOTE — Progress Notes (Signed)
Subjective:    Patient ID: Rose Rose, female    DOB: 10/06/1956, 58 y.o.   MRN: [id number]  HPI  58 year old white female, nonsmoker, presenting today for right ear pain.  Symptoms present x 4 days.  She has sharp pain on right ear pain.  She has history of ear infections. Denies any drainage or discharge.     Review of Systems  Constitutional: Negative.   HENT: Positive for ear pain.        Right ear pain  Respiratory: Negative.   Cardiovascular: Negative.   Genitourinary: Negative.   Musculoskeletal: Negative.   Skin: Negative.   Allergic/Immunologic: Negative.   Neurological: Negative.   Psychiatric/Behavioral: Negative.    Past Medical History  Diagnosis Date  . Hyperlipidemia   . Hypertension   . Arthritis     History   Social History  . Marital Status: Married    Spouse Name: N/A    Number of Children: N/A  . Years of Education: N/A   Occupational History  . Not on file.   Social History Main Topics  . Smoking status: Former Smoker    Quit date: 10/22/2002  . Smokeless tobacco: Never Used  . Alcohol Use: No  . Drug Use: No  . Sexual Activity: Not on file   Other Topics Concern  . Not on file   Social History Narrative  . No narrative on file    Past Surgical History  Procedure Laterality Date  . Tonsillectomy    . Myringotomy      Right ear  . Total knee arthroplasty  05/2011    Dr. Maureen Ralphs    Family History  Problem Relation Age of Onset  . Alzheimer's disease Maternal Aunt   . Arthritis Mother   . Vision loss Mother   . Dementia Father   . Parkinsonism Father     Allergies  Allergen Reactions  . Amoxicillin-Pot Clavulanate     REACTION: swelling    Current Outpatient Prescriptions on File Prior to Visit  Medication Sig Dispense Refill  . Ascorbic Acid (VITAMIN C) 100 MG tablet Take 100 mg by mouth daily.        . Black Cohosh (REMIFEMIN) 20 MG TABS Take by mouth 2 (two) times daily.        . diazepam (VALIUM) 2 MG tablet  Take 1 tablet (2 mg total) by mouth every 12 (twelve) hours as needed for anxiety.  180 tablet  2  . diphenhydrAMINE (SOMINEX) 25 MG tablet Take 25 mg by mouth at bedtime as needed.        . fish oil-omega-3 fatty acids 1000 MG capsule Take 2 g by mouth 2 (two) times daily.        . folic acid (FOLVITE) 1 MG tablet Take 1 mg by mouth daily.        . hydrochlorothiazide (HYDRODIURIL) 25 MG tablet Take 0.5-1 tablets (12.5-25 mg total) by mouth daily.  30 tablet  3  . neomycin-polymyxin-hydrocortisone (CORTISPORIN) 3.5-10000-1 otic suspension Place 3 drops into the right ear 4 (four) times daily.  10 mL  0  . pyridoxine (B-6) 100 MG tablet Take 100 mg by mouth daily.        . simvastatin (ZOCOR) 20 MG tablet Take 1 tablet (20 mg total) by mouth daily.  90 tablet  3  . Tapentadol HCl (NUCYNTA) 75 MG TABS Take 1 tablet (75 mg total) by mouth 2 (two) times daily.  60 tablet  0  .  tiZANidine (ZANAFLEX) 4 MG tablet TAKE 1 TABLET EVERY 8 HOURSAS NEEDED  90 tablet  0  . traMADol (ULTRAM) 50 MG tablet TAKE 1 TABLET EVERY 6 HOURSAS NEEDED FOR PAIN  180 tablet  3  . valACYclovir (VALTREX) 500 MG tablet TAKE 1 TABLET DAILY AS     NEEDED  90 tablet  0   No current facility-administered medications on file prior to visit.    BP 120/92  Pulse 74  Temp(Src) 98.9 F (37.2 C) (Oral)  Wt 234 lb (106.142 kg)chart    Objective:   Physical Exam  Constitutional: She is oriented to person, place, and time. She appears well-developed and well-nourished.  HENT:  Left Ear: External ear normal.  Nose: Nose normal.  Mouth/Throat: Oropharynx is clear and moist.  Right ear red, inflamed and tender to touch  Cardiovascular: Normal rate, regular rhythm and normal heart sounds.   Pulmonary/Chest: Effort normal and breath sounds normal.  Neurological: She is alert and oriented to person, place, and time.  Skin: Skin is warm and dry.          Assessment & Plan:  Assessment 1. Otitis Media-right 2. [place] 1. Bactrim 500 mg PO QID x 7 days.  2. Contact office if problems persist or worsen.

## 2013-11-18 ENCOUNTER — Ambulatory Visit (INDEPENDENT_AMBULATORY_CARE_PROVIDER_SITE_OTHER): Payer: Managed Care, Other (non HMO) | Admitting: Family

## 2013-11-18 ENCOUNTER — Encounter: Payer: Self-pay | Admitting: Family

## 2013-11-18 VITALS — BP 122/72 | HR 67 | Ht 62.75 in | Wt 236.0 lb

## 2013-11-18 DIAGNOSIS — M1990 Unspecified osteoarthritis, unspecified site: Secondary | ICD-10-CM

## 2013-11-18 NOTE — Progress Notes (Signed)
Pre visit review using our clinic review tool, if applicable. No additional management support is needed unless otherwise documented below in the visit note. 

## 2013-11-18 NOTE — Progress Notes (Signed)
   Subjective:    Patient ID: Rose Rose, female    DOB: December 16, 1955, 58 y.o.   MRN: [id number]  HPI  Rescheduled visit  Review of Systems     Objective:   Physical Exam        Assessment & Plan:

## 2013-12-07 ENCOUNTER — Other Ambulatory Visit (INDEPENDENT_AMBULATORY_CARE_PROVIDER_SITE_OTHER): Payer: Managed Care, Other (non HMO)

## 2013-12-07 DIAGNOSIS — Z0000 Encounter for general adult medical examination without abnormal findings: Secondary | ICD-10-CM

## 2013-12-07 DIAGNOSIS — E785 Hyperlipidemia, unspecified: Secondary | ICD-10-CM

## 2013-12-07 DIAGNOSIS — I10 Essential (primary) hypertension: Secondary | ICD-10-CM

## 2013-12-07 LAB — CBC WITH DIFFERENTIAL/PLATELET
Basophils Absolute: 0 10*3/uL (ref 0.0–0.1)
Basophils Relative: 0.4 % (ref 0.0–3.0)
EOS PCT: 1.5 % (ref 0.0–5.0)
Eosinophils Absolute: 0.1 10*3/uL (ref 0.0–0.7)
HEMATOCRIT: 44.3 % (ref 36.0–46.0)
Hemoglobin: 14.3 g/dL (ref 12.0–15.0)
Lymphocytes Relative: 37 % (ref 12.0–46.0)
Lymphs Abs: 2.9 10*3/uL (ref 0.7–4.0)
MCHC: 32.3 g/dL (ref 30.0–36.0)
MCV: 90.2 fl (ref 78.0–100.0)
MONO ABS: 0.4 10*3/uL (ref 0.1–1.0)
Monocytes Relative: 5.1 % (ref 3.0–12.0)
NEUTROS ABS: 4.5 10*3/uL (ref 1.4–7.7)
Neutrophils Relative %: 56 % (ref 43.0–77.0)
Platelets: 306 10*3/uL (ref 150.0–400.0)
RBC: 4.91 Mil/uL (ref 3.87–5.11)
RDW: 15.4 % — ABNORMAL HIGH (ref 11.5–14.6)
WBC: 8 10*3/uL (ref 4.5–10.5)

## 2013-12-07 LAB — BASIC METABOLIC PANEL
BUN: 18 mg/dL (ref 6–23)
CHLORIDE: 104 meq/L (ref 96–112)
CO2: 24 mEq/L (ref 19–32)
Calcium: 9.2 mg/dL (ref 8.4–10.5)
Creatinine, Ser: 0.7 mg/dL (ref 0.4–1.2)
GFR: 87.52 mL/min (ref >60.00)
GLUCOSE: 102 mg/dL — AB (ref 70–99)
POTASSIUM: 3.6 meq/L (ref 3.5–5.1)
Sodium: 139 mEq/L (ref 135–145)

## 2013-12-07 LAB — POCT URINALYSIS DIPSTICK
Bilirubin, UA: NEGATIVE
Glucose, UA: NEGATIVE
Ketones, UA: NEGATIVE
Nitrite, UA: NEGATIVE
PROTEIN UA: NEGATIVE
Spec Grav, UA: 1.015
UROBILINOGEN UA: 0.2
pH, UA: 7

## 2013-12-07 LAB — HEPATIC FUNCTION PANEL
ALT: 23 U/L (ref 0–35)
AST: 25 U/L (ref 0–37)
Alkaline Phosphatase: 95 U/L (ref 39–117)
BILIRUBIN TOTAL: 0.4 mg/dL (ref 0.3–1.2)
Bilirubin, Direct: 0 mg/dL (ref 0.0–0.3)
TOTAL PROTEIN: 7.7 g/dL (ref 6.0–8.3)

## 2013-12-07 LAB — LIPID PANEL
Cholesterol: 173 mg/dL (ref 0–200)
HDL: 46.6 mg/dL (ref >39.00)
Total CHOL/HDL Ratio: 4
Triglycerides: 226 mg/dL — ABNORMAL HIGH (ref 0.0–149.0)
VLDL: 45.2 mg/dL — ABNORMAL HIGH (ref 0.0–40.0)

## 2013-12-07 LAB — TSH: TSH: 1.5 u[IU]/mL (ref 0.35–5.50)

## 2013-12-07 LAB — LDL CHOLESTEROL, DIRECT: Direct LDL: 102.5 mg/dL

## 2013-12-15 ENCOUNTER — Encounter: Payer: Managed Care, Other (non HMO) | Admitting: Family

## 2013-12-16 ENCOUNTER — Encounter: Payer: Self-pay | Admitting: Family

## 2013-12-16 ENCOUNTER — Ambulatory Visit (INDEPENDENT_AMBULATORY_CARE_PROVIDER_SITE_OTHER): Payer: Managed Care, Other (non HMO) | Admitting: Family

## 2013-12-16 VITALS — BP 140/92 | HR 71 | Ht 63.0 in | Wt 233.0 lb

## 2013-12-16 DIAGNOSIS — I10 Essential (primary) hypertension: Secondary | ICD-10-CM

## 2013-12-16 DIAGNOSIS — M1710 Unilateral primary osteoarthritis, unspecified knee: Secondary | ICD-10-CM

## 2013-12-16 DIAGNOSIS — Z0000 Encounter for general adult medical examination without abnormal findings: Secondary | ICD-10-CM

## 2013-12-16 DIAGNOSIS — IMO0002 Reserved for concepts with insufficient information to code with codable children: Secondary | ICD-10-CM

## 2013-12-16 DIAGNOSIS — E669 Obesity, unspecified: Secondary | ICD-10-CM

## 2013-12-16 DIAGNOSIS — E785 Hyperlipidemia, unspecified: Secondary | ICD-10-CM

## 2013-12-16 NOTE — Patient Instructions (Signed)
Exercise to Lose Weight Exercise and a healthy diet may help you lose weight. Your doctor may suggest specific exercises. EXERCISE IDEAS AND TIPS  Choose low-cost things you enjoy doing, such as walking, bicycling, or exercising to workout videos.  Take stairs instead of the elevator.  Walk during your lunch break.  Park your car further away from work or school.  Go to a gym or an exercise class.  Start with 5 to 10 minutes of exercise each day. Build up to 30 minutes of exercise 4 to 6 days a week.  Wear shoes with good support and comfortable clothes.  Stretch before and after working out.  Work out until you breathe harder and your heart beats faster.  Drink extra water when you exercise.  Do not do so much that you hurt yourself, feel dizzy, or get very short of breath. Exercises that burn about 150 calories:  Running 1  miles in 15 minutes.  Playing volleyball for 45 to 60 minutes.  Washing and waxing a car for 45 to 60 minutes.  Playing touch football for 45 minutes.  Walking 1  miles in 35 minutes.  Pushing a stroller 1  miles in 30 minutes.  Playing basketball for 30 minutes.  Raking leaves for 30 minutes.  Bicycling 5 miles in 30 minutes.  Walking 2 miles in 30 minutes.  Dancing for 30 minutes.  Shoveling snow for 15 minutes.  Swimming laps for 20 minutes.  Walking up stairs for 15 minutes.  Bicycling 4 miles in 15 minutes.  Gardening for 30 to 45 minutes.  Jumping rope for 15 minutes.  Washing windows or floors for 45 to 60 minutes. Document Released: 11/10/2010 Document Revised: 12/31/2011 Document Reviewed: 11/10/2010 [facility] Patient Information 2014 [place], [place]. Fat and Cholesterol Control Diet Your diet has an affect on your fat and cholesterol levels in your blood and organs. Too much fat and cholesterol in your blood can affect your:  Heart.  Blood vessels (arteries, veins).  Gallbladder.  Liver.  Pancreas. CONTROL  FAT AND CHOLESTEROL WITH DIET Certain foods raise cholesterol and others lower it. It is important to replace bad fats with other types of fat.  Do not eat:  Fatty meats, such as hot dogs and salami.  Stick margarine and some tub margarines that have "partially hydrogenated oils" in them.  Baked goods, such as cookies and crackers that have "partially hydrogenated oils" in them.  Saturated tropical oils, such as coconut and palm oil. Eat the following foods:  Round or loin cuts of red meat.  Chicken (without skin).  Fish.  Veal.  [place] breast.  Shellfish.  Fruit, such as apples.  Vegetables, such as broccoli, potatoes, and carrots.  Beans, peas, and lentils (legumes).  Grains, such as barley, rice, couscous, and bulgar wheat.  Pasta (without cream sauces). Look for foods that are nonfat, low in fat, and low in cholesterol.  FIND FOODS THAT ARE LOWER IN FAT AND CHOLESTEROL  Find foods with soluble fiber and plant sterols (phytosterol). You should eat 2 grams a day of these foods. These foods include:  Fruits.  Vegetables.  Whole grains.  Dried beans and peas.  Nuts and seeds.  Read package labels. Look for low-saturated fats, trans fat free, low-fat foods.  Choose cheese that have only 2 to 3 grams of saturated fat per ounce.  Use heart-healthy tub margarine that is free of trans fat or partially hydrogenated oil.  Avoid buying baked goods that have partially hydrogenated  oils in them. Instead, buy baked goods made with whole grains (whole-wheat or whole oat flour). Avoid baked goods labeled with "flour" or "enriched flour."  Buy non-creamy canned soups with reduced salt and no added fats. PREPARING YOUR FOOD  Broil, bake, steam, or roast foods. Do not fry food.  Use non-stick cooking sprays.  Use lemon or herbs to flavor food instead of using butter or stick margarine.  Use nonfat yogurt, salsa, or low-fat dressings for salads. LOW-SATURATED  FAT / LOW-FAT FOOD SUBSTITUTES  Meats / Saturated Fat (g)  Avoid: Steak, marbled (3 oz/85 g) / 11 g.  Choose: Steak, lean (3 oz/85 g) / 4 g.  Avoid: Hamburger (3 oz/85 g) / 7 g.  Choose: Hamburger, lean (3 oz/85 g) / 5 g.  Avoid: Ham (3 oz/85 g) / 6 g.  Choose: Ham, lean cut (3 oz/85 g) / 2.4 g.  Avoid: Chicken, with skin, dark meat (3 oz/85 g) / 4 g.  Choose: Chicken, skin removed, dark meat (3 oz/85 g) / 2 g.  Avoid: Chicken, with skin, light meat (3 oz/85 g) / 2.5 g.  Choose: Chicken, skin removed, light meat (3 oz/85 g) / 1 g. Dairy / Saturated Fat (g)  Avoid: Whole milk (1 cup) / 5 g.  Choose: Low-fat milk, 2% (1 cup) / 3 g.  Choose: Low-fat milk, 1% (1 cup) / 1.5 g.  Choose: Skim milk (1 cup) / 0.3 g.  Avoid: Hard cheese (1 oz/28 g) / 6 g.  Choose: Skim milk cheese (1 oz/28 g) / 2 to 3 g.  Avoid: Cottage cheese, 4% fat (1 cup) / 6.5 g.  Choose: Low-fat cottage cheese, 1% fat (1 cup) / 1.5 g.  Avoid: Ice cream (1 cup) / 9 g.  Choose: Sherbet (1 cup) / 2.5 g.  Choose: Nonfat frozen yogurt (1 cup) / 0.3 g.  Choose: Frozen fruit bar / trace.  Avoid: Whipped cream (1 tbs) / 3.5 g.  Choose: Nondairy whipped topping (1 tbs) / 1 g. Condiments / Saturated Fat (g)  Avoid: Mayonnaise (1 tbs) / 2 g.  Choose: Low-fat mayonnaise (1 tbs) / 1 g.  Avoid: Butter (1 tbs) / 7 g.  Choose: Extra light margarine (1 tbs) / 1 g.  Avoid: Coconut oil (1 tbs) / 11.8 g.  Choose: Olive oil (1 tbs) / 1.8 g.  Choose: Corn oil (1 tbs) / 1.7 g.  Choose: Safflower oil (1 tbs) / 1.2 g.  Choose: Sunflower oil (1 tbs) / 1.4 g.  Choose: Soybean oil (1 tbs) / 2.4 g .  Choose: Canola oil (1 tbs) / 1 g. Document Released: 04/08/2012 Document Revised: 06/10/2013 Document Reviewed: 04/08/2012 [facility] Patient Information 2014 [place].

## 2013-12-16 NOTE — Progress Notes (Signed)
Subjective:    Patient ID: Rose Rose, female    DOB: Oct 08, 1956, 58 y.o.   MRN: [id number]  HPI  58 y.o. White female. This is a routine wellness  examination for this patient . I reviewed all health maintenance protocols including mammography, colonoscopy, bone density. Age and diagnosis  appropriate screening labs were reviewed. Her immunization history was reviewed and appropriate vaccinations were ordered. Her current medications and allergies were reviewed and needed refills of her chronic medications were ordered. The plan for yearly health maintenance was discussed all orders and referrals were made as appropriate.    Review of Systems  Constitutional: Negative.   HENT: Negative.   Eyes: Negative.   Respiratory: Negative.   Cardiovascular: Negative.   Gastrointestinal: Negative.   Endocrine: Negative.   Genitourinary: Negative.   Musculoskeletal: Positive for arthralgias.       Bilateral knees "occasionally".   Skin: Negative.   Allergic/Immunologic: Negative.   Neurological: Negative.   Hematological: Negative.   Psychiatric/Behavioral: Negative.    Past Medical History  Diagnosis Date  . Hyperlipidemia   . Hypertension   . Arthritis     History   Social History  . Marital Status: Married    Spouse Name: N/A    Number of Children: N/A  . Years of Education: N/A   Occupational History  . Not on file.   Social History Main Topics  . Smoking status: Former Smoker    Quit date: 10/22/2002  . Smokeless tobacco: Never Used  . Alcohol Use: No  . Drug Use: No  . Sexual Activity: Not on file   Other Topics Concern  . Not on file   Social History Narrative  . No narrative on file    Past Surgical History  Procedure Laterality Date  . Tonsillectomy    . Myringotomy      Right ear  . Total knee arthroplasty  05/2011    Dr. Maureen Rose    Family History  Problem Relation Age of Onset  . Alzheimer's disease Maternal Aunt   . Arthritis Mother   . Vision  loss Mother   . Dementia Father   . Parkinsonism Father     Allergies  Allergen Reactions  . Amoxicillin-Pot Clavulanate     REACTION: swelling    Current Outpatient Prescriptions on File Prior to Visit  Medication Sig Dispense Refill  . Ascorbic Acid (VITAMIN C) 100 MG tablet Take 100 mg by mouth daily.        . Black Cohosh (REMIFEMIN) 20 MG TABS Take by mouth 2 (two) times daily.        . citalopram (CELEXA) 20 MG tablet Take 1 tablet (20 mg total) by mouth 2 (two) times daily.  180 tablet  1  . diazepam (VALIUM) 2 MG tablet Take 1 tablet (2 mg total) by mouth every 12 (twelve) hours as needed for anxiety.  180 tablet  2  . diphenhydrAMINE (SOMINEX) 25 MG tablet Take 25 mg by mouth at bedtime as needed.        . fish oil-omega-3 fatty acids 1000 MG capsule Take 2 g by mouth 2 (two) times daily.        . folic acid (FOLVITE) 1 MG tablet Take 1 mg by mouth daily.        . hydrochlorothiazide (HYDRODIURIL) 25 MG tablet Take 0.5-1 tablets (12.5-25 mg total) by mouth daily.  30 tablet  3  . neomycin-polymyxin-hydrocortisone (CORTISPORIN) 3.5-10000-1 otic suspension Place 3 drops into the  right ear 4 (four) times daily.  10 mL  0  . pyridoxine (B-6) 100 MG tablet Take 100 mg by mouth daily.        . simvastatin (ZOCOR) 20 MG tablet Take 1 tablet (20 mg total) by mouth daily.  90 tablet  3  . Tapentadol HCl (NUCYNTA) 75 MG TABS Take 1 tablet (75 mg total) by mouth 2 (two) times daily.  60 tablet  0  . tiZANidine (ZANAFLEX) 4 MG tablet TAKE 1 TABLET EVERY 8 HOURSAS NEEDED  90 tablet  0  . traMADol (ULTRAM) 50 MG tablet TAKE 1 TABLET EVERY 6 HOURSAS NEEDED FOR PAIN  180 tablet  3  . valACYclovir (VALTREX) 500 MG tablet TAKE 1 TABLET DAILY AS     NEEDED  90 tablet  0   No current facility-administered medications on file prior to visit.    BP 140/92  Pulse 71  Ht 5\' 3"  (1.6 m)  Wt 233 lb (105.688 kg)  BMI 41.28 kg/m2chart    Objective:   Physical Exam  Constitutional: She is oriented  to person, place, and time. She appears well-developed and well-nourished. She is active.  HENT:  Head: Normocephalic.  Right Ear: Tympanic membrane, external ear and ear canal normal.  Left Ear: Tympanic membrane, external ear and ear canal normal.  Nose: Nose normal.  Mouth/Throat: Uvula is midline, oropharynx is clear and moist and mucous membranes are normal.  Eyes: Conjunctivae, EOM and lids are normal. Pupils are equal, round, and reactive to light.  Neck: Trachea normal and normal range of motion.  Cardiovascular: Regular rhythm, S1 normal, S2 normal, normal heart sounds, intact distal pulses and normal pulses.   Pulmonary/Chest: Effort normal and breath sounds normal.  Abdominal: Soft. Normal appearance and bowel sounds are normal.  Neurological: She is alert and oriented to person, place, and time. She has normal strength and normal reflexes. She displays a negative Romberg sign.  Skin: Skin is warm and dry.          Assessment & Plan:  Rose Rose was seen today for annual exam.  Diagnoses and associated orders for this visit:  Other and unspecified hyperlipidemia  Unspecified essential hypertension  Osteoarthrosis, unspecified whether generalized or localized, involving lower leg  Obesity, unspecified   Continue current medicaitons.  - Education: Healthy diet consisting of fruits and veggies, limit sodium and cholesterol. Continue to exercise, try increasing exercise to 5x per week.  - Follow up: 4 months.

## 2013-12-18 ENCOUNTER — Telehealth: Payer: Self-pay | Admitting: Family

## 2013-12-18 NOTE — Telephone Encounter (Signed)
Relevant patient education mailed to patient.  

## 2013-12-24 ENCOUNTER — Other Ambulatory Visit: Payer: Self-pay | Admitting: Internal Medicine

## 2013-12-28 ENCOUNTER — Other Ambulatory Visit: Payer: Self-pay | Admitting: Internal Medicine

## 2014-01-04 ENCOUNTER — Encounter: Payer: Self-pay | Admitting: Family

## 2014-01-14 ENCOUNTER — Telehealth: Payer: Self-pay | Admitting: Family

## 2014-01-14 MED ORDER — HYDROCHLOROTHIAZIDE 25 MG PO TABS: 12.5000 mg | ORAL_TABLET | Freq: Every day | ORAL | Status: DC

## 2014-01-14 MED ORDER — SIMVASTATIN 20 MG PO TABS: 20.0000 mg | ORAL_TABLET | Freq: Every day | ORAL | Status: DC

## 2014-01-14 MED ORDER — CITALOPRAM HYDROBROMIDE 20 MG PO TABS: 20.0000 mg | ORAL_TABLET | Freq: Two times a day (BID) | ORAL | Status: DC

## 2014-01-14 NOTE — Telephone Encounter (Signed)
Done

## 2014-01-14 NOTE — Telephone Encounter (Signed)
Pt request 90 new rx on the following: hydrochlorothiazide (HYDRODIURIL) 25 MG tablet   1/day simvastatin (ZOCOR) 20 MG tablet  1/day citalopram (CELEXA) 20 MG tablet    1 2 x/ day [facility]

## 2014-01-14 NOTE — Telephone Encounter (Signed)
Pt stated [facility] never received rx . Please resubmit

## 2014-01-15 ENCOUNTER — Other Ambulatory Visit: Payer: Self-pay

## 2014-01-15 MED ORDER — SIMVASTATIN 20 MG PO TABS: 20.0000 mg | ORAL_TABLET | Freq: Every day | ORAL | Status: DC

## 2014-01-15 MED ORDER — TIZANIDINE HCL 4 MG PO TABS: ORAL_TABLET | ORAL | Status: DC

## 2014-01-15 NOTE — Telephone Encounter (Signed)
rx resubmitted

## 2014-01-15 NOTE — Addendum Note (Signed)
Addended by: Townsend Roger D on: 01/15/2014 12:11 PM   Modules accepted: Orders

## 2014-01-28 ENCOUNTER — Telehealth: Payer: Self-pay | Admitting: Family

## 2014-01-28 NOTE — Telephone Encounter (Signed)
Ok to fill 

## 2014-01-28 NOTE — Telephone Encounter (Signed)
Pt req rx Tapentadol HCl (NUCYNTA) 75 MG TABS

## 2014-02-16 ENCOUNTER — Telehealth: Payer: Self-pay | Admitting: Family

## 2014-02-16 MED ORDER — DIAZEPAM 2 MG PO TABS: 2.0000 mg | ORAL_TABLET | Freq: Two times a day (BID) | ORAL | Status: DC | PRN

## 2014-02-16 NOTE — Telephone Encounter (Signed)
[facility], [place] is requesting re-fill on diazepam (VALIUM) 2 MG tablet

## 2014-02-16 NOTE — Telephone Encounter (Signed)
Rx faxed

## 2014-03-31 ENCOUNTER — Telehealth: Payer: Self-pay | Admitting: Family

## 2014-03-31 NOTE — Telephone Encounter (Signed)
Pt is needing new rx for Tapentadol HCl (NUCYNTA) 75 MG TABS, please call when available for pick up.

## 2014-03-31 NOTE — Telephone Encounter (Signed)
Ok to fill? Last Rx'd 10/06/2013

## 2014-04-01 MED ORDER — TAPENTADOL HCL 75 MG PO TABS: 75.0000 mg | ORAL_TABLET | Freq: Two times a day (BID) | ORAL | Status: DC

## 2014-04-01 NOTE — Telephone Encounter (Signed)
Printed

## 2014-04-01 NOTE — Telephone Encounter (Signed)
Ok to fill 

## 2014-04-15 ENCOUNTER — Encounter: Payer: Self-pay | Admitting: Family

## 2014-04-15 ENCOUNTER — Ambulatory Visit (INDEPENDENT_AMBULATORY_CARE_PROVIDER_SITE_OTHER): Payer: Managed Care, Other (non HMO) | Admitting: Family

## 2014-04-15 VITALS — BP 140/88 | Temp 98.6°F | Wt 234.0 lb

## 2014-04-15 DIAGNOSIS — F32A Depression, unspecified: Secondary | ICD-10-CM

## 2014-04-15 DIAGNOSIS — J309 Allergic rhinitis, unspecified: Secondary | ICD-10-CM

## 2014-04-15 DIAGNOSIS — B009 Herpesviral infection, unspecified: Secondary | ICD-10-CM

## 2014-04-15 DIAGNOSIS — E7800 Pure hypercholesterolemia, unspecified: Secondary | ICD-10-CM

## 2014-04-15 DIAGNOSIS — F3289 Other specified depressive episodes: Secondary | ICD-10-CM

## 2014-04-15 DIAGNOSIS — F329 Major depressive disorder, single episode, unspecified: Secondary | ICD-10-CM

## 2014-04-15 NOTE — Progress Notes (Signed)
Pre visit review using our clinic review tool, if applicable. No additional management support is needed unless otherwise documented below in the visit note. 

## 2014-04-15 NOTE — Progress Notes (Signed)
Subjective:    Patient ID: Rose Rose, female    DOB: 11-17-1955, 58 y.o.   MRN: [id number]  HPI  58 year old nonsmoking Caucasian presents for follow up on hyperlipidemia. She is compliant with Zocor, has a modified heart healthy diet, with limited exercise or physical activity.  She specifically denies myalgias or memory deficits.   An acute issue today is the presence of a cough which wakes her at night. This is not a new issue; she has experienced this cough intermittently over a year. The cough is mostly dry. She has seasonal allergies and history of ENT issues. She denies heartburn or GERD and believes the cough may be related to PND.   Also, she reports being bitten on left foot by what she thought was a mosquito on Saturday, however the insect bite is still inflamed with dark center and she questions if it is infected.   Review of Systems  Constitutional: Negative.   HENT: Negative.   Eyes: Negative.   Respiratory: Negative.   Cardiovascular: Negative.   Gastrointestinal: Negative.   Endocrine: Negative.   Genitourinary: Negative.   Musculoskeletal: Negative.   Skin: Negative.   Allergic/Immunologic: Negative.   Neurological: Negative.   Hematological: Negative.   Psychiatric/Behavioral: Negative.    Past Medical History  Diagnosis Date  . Hyperlipidemia   . Hypertension   . Arthritis     History   Social History  . Marital Status: Married    Spouse Name: N/A    Number of Children: N/A  . Years of Education: N/A   Occupational History  . Not on file.   Social History Main Topics  . Smoking status: Former Smoker    Quit date: 10/22/2002  . Smokeless tobacco: Never Used  . Alcohol Use: No  . Drug Use: No  . Sexual Activity: Not on file   Other Topics Concern  . Not on file   Social History Narrative  . No narrative on file    Past Surgical History  Procedure Laterality Date  . Tonsillectomy    . Myringotomy      Right ear  . Total knee  arthroplasty  05/2011    Dr. Maureen Ralphs    Family History  Problem Relation Age of Onset  . Alzheimer's disease Maternal Aunt   . Arthritis Mother   . Vision loss Mother   . Dementia Father   . Parkinsonism Father     Allergies  Allergen Reactions  . Amoxicillin-Pot Clavulanate     REACTION: swelling    Current Outpatient Prescriptions on File Prior to Visit  Medication Sig Dispense Refill  . Ascorbic Acid (VITAMIN C) 100 MG tablet Take 100 mg by mouth daily.        . Black Cohosh (REMIFEMIN) 20 MG TABS Take by mouth 2 (two) times daily.        . citalopram (CELEXA) 20 MG tablet Take 1 tablet (20 mg total) by mouth 2 (two) times daily.  180 tablet  0  . diazepam (VALIUM) 2 MG tablet Take 1 tablet (2 mg total) by mouth every 12 (twelve) hours as needed for anxiety.  180 tablet  2  . diphenhydrAMINE (SOMINEX) 25 MG tablet Take 25 mg by mouth at bedtime as needed.        . fish oil-omega-3 fatty acids 1000 MG capsule Take 2 g by mouth 2 (two) times daily.        . folic acid (FOLVITE) 1 MG tablet Take 1  mg by mouth daily.        . hydrochlorothiazide (HYDRODIURIL) 25 MG tablet Take 0.5-1 tablets (12.5-25 mg total) by mouth daily.  90 tablet  0  . neomycin-polymyxin-hydrocortisone (CORTISPORIN) 3.5-10000-1 otic suspension Place 3 drops into the right ear 4 (four) times daily.  10 mL  0  . pyridoxine (B-6) 100 MG tablet Take 100 mg by mouth daily.        . simvastatin (ZOCOR) 20 MG tablet Take 1 tablet (20 mg total) by mouth daily.  90 tablet  0  . Tapentadol HCl (NUCYNTA) 75 MG TABS Take 1 tablet (75 mg total) by mouth 2 (two) times daily.  60 tablet  0  . tiZANidine (ZANAFLEX) 4 MG tablet TAKE 1 TABLET EVERY 8 HOURSAS NEEDED  90 tablet  3  . traMADol (ULTRAM) 50 MG tablet TAKE 1 TABLET EVERY 6 HOURSAS NEEDED FOR PAIN  180 tablet  3  . valACYclovir (VALTREX) 500 MG tablet TAKE 1 TABLET DAILY AS     NEEDED  90 tablet  0   No current facility-administered medications on file prior to  visit.    BP 140/88  Temp(Src) 98.6 F (37 C) (Oral)  Wt 234 lb (106.142 kg)     Objective:   Physical Exam  Constitutional: She is oriented to person, place, and time. She appears well-developed and well-nourished.  HENT:  Head: Normocephalic and atraumatic.  Left Ear: External ear normal.  Mild erythema of oropharynx.  Significant scarring of left TM. No signs of infection.   Eyes: EOM are normal. Pupils are equal, round, and reactive to light. Right eye exhibits no discharge. Left eye exhibits no discharge.  Neck: Normal range of motion. Neck supple.  Cardiovascular: Normal rate, regular rhythm and normal heart sounds.   Pulmonary/Chest: Effort normal and breath sounds normal.  Abdominal: Soft.  Musculoskeletal: Normal range of motion. She exhibits no edema.  Lymphadenopathy:    She has no cervical adenopathy.  Neurological: She is alert and oriented to person, place, and time.  Skin: Skin is warm and dry.  Small red area 0.5 cm on dorsal right foot with darkened center  Psychiatric: She has a normal mood and affect. Her behavior is normal. Judgment and thought content normal.       Assessment & Plan:  Rose Rose was seen today for follow-up.  Diagnoses and associated orders for this visit:  Allergic rhinitis, unspecified allergic rhinitis type  Pure hypercholesterolemia  Herpes simplex  Depression     Zyrtec 10 mg daily at bedtime, nasal hygiene. Continue with Flonase.   Follow up in 6 months for CPX with labs.   Follow up in 2 weeks if insect bite area is same or worsens. Report malaise, neck stiffness, arthralgias, or signs of infection.

## 2014-04-15 NOTE — Patient Instructions (Addendum)
Nasal hygeine as discussed and Zyrtec 10 mg daily at night.   Allergic Rhinitis Allergic rhinitis is when the mucous membranes in the nose respond to allergens. Allergens are particles in the air that cause your body to have an allergic reaction. This causes you to release allergic antibodies. Through a chain of events, these eventually cause you to release histamine into the blood stream. Although meant to protect the body, it is this release of histamine that causes your discomfort, such as frequent sneezing, congestion, and an itchy, runny nose.  CAUSES  Seasonal allergic rhinitis (hay fever) is caused by pollen allergens that may come from grasses, trees, and weeds. Year-round allergic rhinitis (perennial allergic rhinitis) is caused by allergens such as house dust mites, pet dander, and mold spores.  SYMPTOMS   Nasal stuffiness (congestion).  Itchy, runny nose with sneezing and tearing of the eyes. DIAGNOSIS  Your health care Rekha Hobbins can help you determine the allergen or allergens that trigger your symptoms. If you and your health care Janaisa Birkland are unable to determine the allergen, skin or blood testing may be used. TREATMENT  Allergic rhinitis does not have a cure, but it can be controlled by:  Medicines and allergy shots (immunotherapy).  Avoiding the allergen. Hay fever may often be treated with antihistamines in pill or nasal spray forms. Antihistamines block the effects of histamine. There are over-the-counter medicines that may help with nasal congestion and swelling around the eyes. Check with your health care Domenique Southers before taking or giving this medicine.  If avoiding the allergen or the medicine prescribed do not work, there are many new medicines your health care Jonnette Nuon can prescribe. Stronger medicine may be used if initial measures are ineffective. Desensitizing injections can be used if medicine and avoidance does not work. Desensitization is when a patient is given ongoing  shots until the body becomes less sensitive to the allergen. Make sure you follow up with your health care Jevante Hollibaugh if problems continue. HOME CARE INSTRUCTIONS It is not possible to completely avoid allergens, but you can reduce your symptoms by taking steps to limit your exposure to them. It helps to know exactly what you are allergic to so that you can avoid your specific triggers. SEEK MEDICAL CARE IF:   You have a fever.  You develop a cough that does not stop easily (persistent).  You have shortness of breath.  You start wheezing.  Symptoms interfere with normal daily activities. Document Released: 07/03/2001 Document Revised: 10/13/2013 Document Reviewed: 06/15/2013 [facility] Patient Information 2015 [place], [place]. This information is not intended to replace advice given to you by your health care Deriona Altemose. Make sure you discuss any questions you have with your health care Erikah Thumm.

## 2014-04-18 ENCOUNTER — Other Ambulatory Visit: Payer: Self-pay | Admitting: Internal Medicine

## 2014-05-03 ENCOUNTER — Other Ambulatory Visit: Payer: Self-pay | Admitting: Family

## 2014-05-28 ENCOUNTER — Encounter: Payer: Self-pay | Admitting: Family Medicine

## 2014-05-28 ENCOUNTER — Ambulatory Visit (INDEPENDENT_AMBULATORY_CARE_PROVIDER_SITE_OTHER): Payer: Managed Care, Other (non HMO) | Admitting: Family Medicine

## 2014-05-28 VITALS — BP 126/82 | HR 65 | Temp 97.9°F | Ht 63.0 in | Wt 238.0 lb

## 2014-05-28 DIAGNOSIS — I10 Essential (primary) hypertension: Secondary | ICD-10-CM

## 2014-05-28 DIAGNOSIS — M7712 Lateral epicondylitis, left elbow: Secondary | ICD-10-CM

## 2014-05-28 DIAGNOSIS — M7710 Lateral epicondylitis, unspecified elbow: Secondary | ICD-10-CM

## 2014-05-28 NOTE — Patient Instructions (Signed)
-  do exercises provided  -tylenol or naproxen for pain IF needed - do not combine with other medications or take more then advise, and use pain medications sparingly  -proper mechanics  -follow up with ortho or Padonda in 1 month or as needed

## 2014-05-28 NOTE — Progress Notes (Signed)
No chief complaint on file.   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 Gemma Payor  HTN: -reports always up initially -denies: malaise  ROS: See pertinent positives and negatives per HPI.  Past Medical History  Diagnosis Date  . Hyperlipidemia   . Hypertension   . Arthritis     Past Surgical History  Procedure Laterality Date  . Tonsillectomy    . Myringotomy      Right ear  . Total knee arthroplasty  05/2011    Dr. Maureen Ralphs    Family History  Problem Relation Age of Onset  . Alzheimer's disease Maternal Aunt   . Arthritis Mother   . Vision loss Mother   . Dementia Father   . Parkinsonism Father     History   Social History  . Marital Status: Married    Spouse Name: N/A    Number of Children: N/A  . Years of Education: N/A   Social History Main Topics  . Smoking status: Former Smoker    Quit date: 10/22/2002  . Smokeless tobacco: Never Used  . Alcohol Use: No  . Drug Use: No  . Sexual Activity: None   Other Topics Concern  . None   Social History Narrative  . None    Current outpatient prescriptions:Ascorbic Acid (VITAMIN C) 100 MG tablet, Take 100 mg by mouth daily.  , Disp: , Rfl: ;  Black Cohosh (REMIFEMIN) 20 MG TABS, Take by mouth 2 (two) times daily.  , Disp: , Rfl: ;  citalopram (CELEXA) 20 MG tablet, Take 1 tablet (20 mg total) by mouth 2 (two) times daily., Disp: 180 tablet, Rfl: 0 diazepam (VALIUM) 2 MG tablet, Take 1 tablet (2 mg total) by mouth every 12 (twelve)  hours as needed for anxiety., Disp: 180 tablet, Rfl: 2;  hydrochlorothiazide (HYDRODIURIL) 25 MG tablet, Take 0.5-1 tablets (12.5-25 mg total) by mouth daily., Disp: 90 tablet, Rfl: 0;  Multiple Vitamin (MULTIVITAMIN) capsule, Take 1 capsule by mouth daily., Disp: , Rfl:  neomycin-polymyxin-hydrocortisone (CORTISPORIN) 3.5-10000-1 otic suspension, Place 3 drops into the right ear 4 (four) times daily., Disp: 10 mL, Rfl: 0;  pyridoxine (B-6) 100 MG tablet, Take 100 mg by mouth daily.  , Disp: , Rfl: ;  simvastatin (ZOCOR) 20 MG tablet, TAKE 1 TABLET DAILY, Disp: 90 tablet, Rfl: 1;  Tapentadol HCl (NUCYNTA) 75 MG TABS, Take 1 tablet (75 mg total) by mouth 2 (two) times daily., Disp: 60 tablet, Rfl: 0 tiZANidine (ZANAFLEX) 4 MG tablet, TAKE 1 TABLET EVERY 8 HOURSAS NEEDED, Disp: 90 tablet, Rfl: 3;  traMADol (ULTRAM) 50 MG tablet, TAKE 1 TABLET EVERY 6 HOURSAS NEEDED FOR PAIN, Disp: 180 tablet, Rfl: 3;  valACYclovir (VALTREX) 500 MG tablet, TAKE 1 TABLET DAILY AS     NEEDED, Disp: 90 tablet, Rfl: 0;  valACYclovir (VALTREX) 500 MG tablet, TAKE 1 TABLET DAILY AS     NEEDED, Disp: 90 tablet, Rfl: 0  EXAM:  Filed Vitals:   05/28/14 1000  BP: 148/92  Pulse: 65  Temp: 97.9 F (36.6 C)    Body mass index is 42.17 kg/(m^2).  GENERAL: vitals reviewed and listed above, alert, oriented, appears well hydrated and in no acute distress  HEENT: atraumatic, conjunttiva clear, no obvious abnormalities on inspection of external nose and ears  NECK: no obvious masses on inspection  CV: HRRR, tr LE edema  MS: moves all extremities without noticeable abnormality, normal inspection of arms bilat, TTP in lateral forearm ext tendons, normal ROM, strength, sensation throughout, NV intact distal  PSYCH: pleasant and cooperative, no obvious depression or anxiety  ASSESSMENT AND PLAN:  Discussed the following assessment and plan:  HYPERTENSION -recheck 126/82 -recheck at follow up  Lateral epicondylitis,  left -we discussed possible serious and likely etiologies, workup and treatment, treatment risks and return precautions - I suspect lateral epicondylitis -after this discussion, Rose Rose opted for HEP, conservative tx -follow up advised follow up with PCP or ortho in 1 month or as needed -of course, we advised Rose Rose  to return or notify a doctor immediately if symptoms worsen or persist or new concerns arise.  .  -Patient advised to return or notify a doctor immediately if symptoms worsen or persist or new concerns arise.  Patient Instructions  -do exercises provided  -tylenol or naproxen for pain IF needed - do not combine with other medications or take more then advise, and use pain medications sparingly  -proper mechanics  -follow up with ortho or Padonda in 1 month or as needed     Rose Rose R.

## 2014-05-28 NOTE — Progress Notes (Signed)
Pre visit review using our clinic review tool, if applicable. No additional management support is needed unless otherwise documented below in the visit note. 

## 2014-07-05 ENCOUNTER — Telehealth: Payer: Self-pay | Admitting: Family

## 2014-07-05 NOTE — Telephone Encounter (Signed)
Pt is requesting re-fill on Tapentadol HCl (NUCYNTA) 75 MG TABS [facility] on [address]

## 2014-07-06 MED ORDER — TAPENTADOL HCL 75 MG PO TABS: 75.0000 mg | ORAL_TABLET | Freq: Two times a day (BID) | ORAL | Status: DC

## 2014-07-06 NOTE — Telephone Encounter (Signed)
Rx printed and pt aware

## 2014-07-25 ENCOUNTER — Other Ambulatory Visit: Payer: Self-pay | Admitting: Family

## 2014-07-26 ENCOUNTER — Other Ambulatory Visit: Payer: Self-pay | Admitting: Family

## 2014-07-26 ENCOUNTER — Telehealth: Payer: Self-pay | Admitting: Family

## 2014-07-26 NOTE — Telephone Encounter (Signed)
[facility] is requesting re-fill on traMADol (ULTRAM) 50 MG tablet

## 2014-07-27 MED ORDER — TRAMADOL HCL 50 MG PO TABS: ORAL_TABLET | ORAL | Status: DC

## 2014-07-27 NOTE — Telephone Encounter (Signed)
Printed

## 2014-10-19 ENCOUNTER — Other Ambulatory Visit: Payer: Self-pay | Admitting: *Deleted

## 2014-10-19 MED ORDER — TIZANIDINE HCL 4 MG PO TABS: ORAL_TABLET | ORAL | Status: DC

## 2014-10-24 ENCOUNTER — Other Ambulatory Visit: Payer: Self-pay | Admitting: Family

## 2014-11-08 ENCOUNTER — Telehealth: Payer: Self-pay

## 2014-11-08 NOTE — Telephone Encounter (Signed)
Rx request for diazepam 2 mg tablet- Take 1 tablet every 12 hours as needed for anxiety #180  Pharm:  [facility]  Pls advise.

## 2014-11-09 ENCOUNTER — Other Ambulatory Visit: Payer: Self-pay

## 2014-11-09 MED ORDER — DIAZEPAM 2 MG PO TABS: 2.0000 mg | ORAL_TABLET | Freq: Two times a day (BID) | ORAL | Status: DC | PRN

## 2014-11-09 NOTE — Telephone Encounter (Signed)
Done

## 2014-11-09 NOTE — Telephone Encounter (Signed)
1st Rx did not print. reprinted

## 2014-12-31 ENCOUNTER — Other Ambulatory Visit: Payer: Self-pay | Admitting: *Deleted

## 2014-12-31 DIAGNOSIS — H6690 Otitis media, unspecified, unspecified ear: Secondary | ICD-10-CM

## 2014-12-31 MED ORDER — NEOMYCIN-POLYMYXIN-HC 3.5-10000-1 OT SUSP: 3.0000 [drp] | Freq: Four times a day (QID) | OTIC | Status: DC

## 2015-01-07 ENCOUNTER — Other Ambulatory Visit: Payer: Self-pay | Admitting: Family Medicine

## 2015-01-07 MED ORDER — VALACYCLOVIR HCL 500 MG PO TABS: ORAL_TABLET | ORAL | Status: DC

## 2015-01-22 ENCOUNTER — Other Ambulatory Visit: Payer: Self-pay | Admitting: Family

## 2015-01-28 ENCOUNTER — Ambulatory Visit (INDEPENDENT_AMBULATORY_CARE_PROVIDER_SITE_OTHER): Payer: BLUE CROSS/BLUE SHIELD | Admitting: Family

## 2015-01-28 ENCOUNTER — Encounter: Payer: Self-pay | Admitting: Family

## 2015-01-28 VITALS — BP 160/98 | HR 88 | Temp 99.0°F | Wt 238.7 lb

## 2015-01-28 DIAGNOSIS — E7800 Pure hypercholesterolemia, unspecified: Secondary | ICD-10-CM

## 2015-01-28 DIAGNOSIS — F329 Major depressive disorder, single episode, unspecified: Secondary | ICD-10-CM

## 2015-01-28 DIAGNOSIS — F411 Generalized anxiety disorder: Secondary | ICD-10-CM | POA: Diagnosis not present

## 2015-01-28 DIAGNOSIS — F32A Depression, unspecified: Secondary | ICD-10-CM

## 2015-01-28 MED ORDER — VENLAFAXINE HCL ER 37.5 MG PO CP24: 37.5000 mg | ORAL_CAPSULE | Freq: Every day | ORAL | Status: DC

## 2015-01-28 NOTE — Progress Notes (Signed)
Subjective:    Patient ID: Rose Rose, female    DOB: 1956/06/23, 59 y.o.   MRN: [id number]  HPI 59 year old white female, nonsmoker with a history of hyperlipidemia, hypertension, osteoarthritis is in today for recheck. She has a history of depression and anxiety that has become worse over the last several weeks. Reports dealing with a lot of stress at home and work. Is currently taking Celexa 20 mg twice a day that does not seem to be helping. Denies any feelings of helplessness or hopelessness. No thoughts of death or dying. Has crying spells.   Review of Systems  Constitutional: Negative.   HENT: Negative.   Respiratory: Negative.   Cardiovascular: Negative.   Gastrointestinal: Negative.   Endocrine: Negative.   Genitourinary: Negative.   Musculoskeletal: Negative.   Skin: Negative.   Allergic/Immunologic: Negative.   Neurological: Negative.   Psychiatric/Behavioral: The patient is nervous/anxious.    Past Medical History  Diagnosis Date  . Hyperlipidemia   . Hypertension   . Arthritis     History   Social History  . Marital Status: Married    Spouse Name: N/A  . Number of Children: N/A  . Years of Education: N/A   Occupational History  . Not on file.   Social History Main Topics  . Smoking status: Former Smoker    Quit date: 10/22/2002  . Smokeless tobacco: Never Used  . Alcohol Use: No  . Drug Use: No  . Sexual Activity: Not on file   Other Topics Concern  . Not on file   Social History Narrative    Past Surgical History  Procedure Laterality Date  . Tonsillectomy    . Myringotomy      Right ear  . Total knee arthroplasty  05/2011    Dr. Maureen Ralphs    Family History  Problem Relation Age of Onset  . Alzheimer's disease Maternal Aunt   . Arthritis Mother   . Vision loss Mother   . Dementia Father   . Parkinsonism Father     Allergies  Allergen Reactions  . Amoxicillin-Pot Clavulanate     REACTION: swelling    Current Outpatient  Prescriptions on File Prior to Visit  Medication Sig Dispense Refill  . Black Cohosh (REMIFEMIN) 20 MG TABS Take by mouth 2 (two) times daily.      . citalopram (CELEXA) 20 MG tablet TAKE 1 TABLET TWICE A DAY 180 tablet 0  . diazepam (VALIUM) 2 MG tablet Take 1 tablet (2 mg total) by mouth every 12 (twelve) hours as needed for anxiety. 180 tablet 2  . hydrochlorothiazide (HYDRODIURIL) 25 MG tablet TAKE 1/2 TO 1 TABLET (=12.5TO 25 MG) DAILY 90 tablet 0  . Multiple Vitamin (MULTIVITAMIN) capsule Take 1 capsule by mouth daily.    Marland Kitchen neomycin-polymyxin-hydrocortisone (CORTISPORIN) 3.5-10000-1 otic suspension Place 3 drops into the right ear 4 (four) times daily. 10 mL 0  . pyridoxine (B-6) 100 MG tablet Take 100 mg by mouth daily.      . simvastatin (ZOCOR) 20 MG tablet TAKE 1 TABLET DAILY 90 tablet 0  . tiZANidine (ZANAFLEX) 4 MG tablet TAKE 1 TABLET EVERY 8 HOURSAS NEEDED 90 tablet 1  . traMADol (ULTRAM) 50 MG tablet TAKE 1 TABLET EVERY 6 HOURSAS NEEDED FOR PAIN 180 tablet 3  . valACYclovir (VALTREX) 500 MG tablet TAKE 1 TABLET DAILY AS     NEEDED 90 tablet 0  . Ascorbic Acid (VITAMIN C) 100 MG tablet Take 100 mg by mouth daily.      Marland Kitchen  Tapentadol HCl (NUCYNTA) 75 MG TABS Take 1 tablet (75 mg total) by mouth 2 (two) times daily. (Patient not taking: Reported on 01/28/2015) 60 tablet 0   No current facility-administered medications on file prior to visit.    BP 160/98 mmHg  Pulse 88  Temp(Src) 99 F (37.2 C) (Oral)  Wt 238 lb 11.2 oz (108.274 kg)chart     Objective:   Physical Exam  Constitutional: She is oriented to person, place, and time. She appears well-developed and well-nourished.  HENT:  Right Ear: External ear normal.  Left Ear: External ear normal.  Nose: Nose normal.  Mouth/Throat: Oropharynx is clear and moist.  Neck: Normal range of motion. Neck supple. No thyromegaly present.  Cardiovascular: Normal rate, regular rhythm and normal heart sounds.   Pulmonary/Chest: Effort  normal and breath sounds normal.  Musculoskeletal: Normal range of motion.  Neurological: She is alert and oriented to person, place, and time.  Skin: Skin is warm and dry.  Psychiatric: She has a normal mood and affect.          Assessment & Plan:  Kanai was seen today for follow-up.  Diagnoses and all orders for this visit:  Depression  Pure hypercholesterolemia Orders: -     Lipid Panel -     Basic Metabolic Panel -     Hepatic Function Panel  Other orders -     Discontinue: venlafaxine XR (EFFEXOR-XR) 37.5 MG 24 hr capsule; Take 1 capsule (37.5 mg total) by mouth daily with breakfast. -     venlafaxine XR (EFFEXOR-XR) 37.5 MG 24 hr capsule; Take 1 capsule (37.5 mg total) by mouth daily with breakfast.   Continue Celexa. Initiate Effexor 37.5 mg once daily area encouraged exercise and weight reduction. Continue current medications. Labs obtained will notify patient pending results. Recheck in 3-4 weeks and sooner as needed.

## 2015-01-28 NOTE — Progress Notes (Signed)
Pre visit review using our clinic review tool, if applicable. No additional management support is needed unless otherwise documented below in the visit note. 

## 2015-01-28 NOTE — Patient Instructions (Signed)
Stress and Stress Management Stress is a normal reaction to life events. It is what you feel when life demands more than you are used to or more than you can handle. Some stress can be useful. For example, the stress reaction can help you catch the last bus of the day, study for a test, or meet a deadline at work. But stress that occurs too often or for too long can cause problems. It can affect your emotional health and interfere with relationships and normal daily activities. Too much stress can weaken your immune system and increase your risk for physical illness. If you already have a medical problem, stress can make it worse. CAUSES  All sorts of life events may cause stress. An event that causes stress for one person may not be stressful for another person. Major life events commonly cause stress. These may be positive or negative. Examples include losing your job, moving into a new home, getting married, having a baby, or losing a loved one. Less obvious life events may also cause stress, especially if they occur day after day or in combination. Examples include working long hours, driving in traffic, caring for children, being in debt, or being in a difficult relationship. SIGNS AND SYMPTOMS Stress may cause emotional symptoms including, the following:  Anxiety. This is feeling worried, afraid, on edge, overwhelmed, or out of control.  Anger. This is feeling irritated or impatient.  Depression. This is feeling sad, down, helpless, or guilty.  Difficulty focusing, remembering, or making decisions. Stress may cause physical symptoms, including the following:   Aches and pains. These may affect your head, neck, back, stomach, or other areas of your body.  Tight muscles or clenched jaw.  Low energy or trouble sleeping. Stress may cause unhealthy behaviors, including the following:   Eating to feel better (overeating) or skipping meals.  Sleeping too little, too much, or both.  Working  too much or putting off tasks (procrastination).  Smoking, drinking alcohol, or using drugs to feel better. DIAGNOSIS  Stress is diagnosed through an assessment by your health care provider. Your health care provider will ask questions about your symptoms and any stressful life events.Your health care provider will also ask about your medical history and may order blood tests or other tests. Certain medical conditions and medicine can cause physical symptoms similar to stress. Mental illness can cause emotional symptoms and unhealthy behaviors similar to stress. Your health care provider may refer you to a mental health professional for further evaluation.  TREATMENT  Stress management is the recommended treatment for stress.The goals of stress management are reducing stressful life events and coping with stress in healthy ways.  Techniques for reducing stressful life events include the following:  Stress identification. Self-monitor for stress and identify what causes stress for you. These skills may help you to avoid some stressful events.  Time management. Set your priorities, keep a calendar of events, and learn to say "no." These tools can help you avoid making too many commitments. Techniques for coping with stress include the following:  Rethinking the problem. Try to think realistically about stressful events rather than ignoring them or overreacting. Try to find the positives in a stressful situation rather than focusing on the negatives.  Exercise. Physical exercise can release both physical and emotional tension. The key is to find a form of exercise you enjoy and do it regularly.  Relaxation techniques. These relax the body and mind. Examples include yoga, meditation, tai chi, biofeedback, deep  breathing, progressive muscle relaxation, listening to music, being out in nature, journaling, and other hobbies. Again, the key is to find one or more that you enjoy and can do  regularly.  Healthy lifestyle. Eat a balanced diet, get plenty of sleep, and do not smoke. Avoid using alcohol or drugs to relax.  Strong support network. Spend time with family, friends, or other people you enjoy being around.Express your feelings and talk things over with someone you trust. Counseling or talktherapy with a mental health professional may be helpful if you are having difficulty managing stress on your own. Medicine is typically not recommended for the treatment of stress.Talk to your health care provider if you think you need medicine for symptoms of stress. HOME CARE INSTRUCTIONS  Keep all follow-up visits as directed by your health care provider.  Take all medicines as directed by your health care provider. SEEK MEDICAL CARE IF:  Your symptoms get worse or you start having new symptoms.  You feel overwhelmed by your problems and can no longer manage them on your own. SEEK IMMEDIATE MEDICAL CARE IF:  You feel like hurting yourself or someone else. Document Released: 04/03/2001 Document Revised: 02/22/2014 Document Reviewed: 06/02/2013 [employer] Patient Information 2015 [employer]. This information is not intended to replace advice given to you by your health care provider. Make sure you discuss any questions you have with your health care provider.  

## 2015-02-14 ENCOUNTER — Ambulatory Visit: Payer: Managed Care, Other (non HMO) | Admitting: Family Medicine

## 2015-02-15 ENCOUNTER — Other Ambulatory Visit: Payer: Self-pay

## 2015-02-15 MED ORDER — TRAMADOL HCL 50 MG PO TABS: ORAL_TABLET | ORAL | Status: DC

## 2015-02-15 NOTE — Telephone Encounter (Signed)
Printed and faxed

## 2015-03-04 ENCOUNTER — Telehealth: Payer: Self-pay

## 2015-03-04 DIAGNOSIS — Z1231 Encounter for screening mammogram for malignant neoplasm of breast: Secondary | ICD-10-CM

## 2015-03-04 NOTE — Telephone Encounter (Signed)
Mammogram ordered and [facility] will call to schedule

## 2015-03-11 ENCOUNTER — Encounter: Payer: Self-pay | Admitting: Family

## 2015-03-11 ENCOUNTER — Ambulatory Visit (INDEPENDENT_AMBULATORY_CARE_PROVIDER_SITE_OTHER): Payer: BLUE CROSS/BLUE SHIELD | Admitting: Family

## 2015-03-11 VITALS — BP 158/96 | HR 84 | Temp 98.4°F | Wt 220.0 lb

## 2015-03-11 DIAGNOSIS — M722 Plantar fascial fibromatosis: Secondary | ICD-10-CM

## 2015-03-11 DIAGNOSIS — F411 Generalized anxiety disorder: Secondary | ICD-10-CM | POA: Diagnosis not present

## 2015-03-11 MED ORDER — IBUPROFEN 800 MG PO TABS: 800.0000 mg | ORAL_TABLET | Freq: Three times a day (TID) | ORAL | Status: DC | PRN

## 2015-03-11 NOTE — Patient Instructions (Signed)
Plantar Fasciitis  Plantar fasciitis is a common condition that causes foot pain. It is soreness (inflammation) of the band of tough fibrous tissue on the bottom of the foot that runs from the heel bone (calcaneus) to the ball of the foot. The cause of this soreness may be from excessive standing, poor fitting shoes, running on hard surfaces, being overweight, having an abnormal walk, or overuse (this is common in runners) of the painful foot or feet. It is also common in aerobic exercise dancers and ballet dancers.  SYMPTOMS   Most people with plantar fasciitis complain of:   Severe pain in the morning on the bottom of their foot especially when taking the first steps out of bed. This pain recedes after a few minutes of walking.   Severe pain is experienced also during walking following a long period of inactivity.   Pain is worse when walking barefoot or up stairs  DIAGNOSIS    Your caregiver will diagnose this condition by examining and feeling your foot.   Special tests such as X-rays of your foot, are usually not needed.  PREVENTION    Consult a sports medicine professional before beginning a new exercise program.   Walking programs offer a good workout. With walking there is a lower chance of overuse injuries common to runners. There is less impact and less jarring of the joints.   Begin all new exercise programs slowly. If problems or pain develop, decrease the amount of time or distance until you are at a comfortable level.   Wear good shoes and replace them regularly.   Stretch your foot and the heel cords at the back of the ankle (Achilles tendon) both before and after exercise.   Run or exercise on even surfaces that are not hard. For example, asphalt is better than pavement.   Do not run barefoot on hard surfaces.   If using a treadmill, vary the incline.   Do not continue to workout if you have foot or joint problems. Seek professional help if they do not improve.  HOME CARE INSTRUCTIONS     Avoid activities that cause you pain until you recover.   Use ice or cold packs on the problem or painful areas after working out.   Only take over-the-counter or prescription medicines for pain, discomfort, or fever as directed by your caregiver.   Soft shoe inserts or athletic shoes with air or gel sole cushions may be helpful.   If problems continue or become more severe, consult a sports medicine caregiver or your own health care provider. Cortisone is a potent anti-inflammatory medication that may be injected into the painful area. You can discuss this treatment with your caregiver.  MAKE SURE YOU:    Understand these instructions.   Will watch your condition.   Will get help right away if you are not doing well or get worse.  Document Released: 07/03/2001 Document Revised: 12/31/2011 Document Reviewed: 09/01/2008  [employer] Patient Information 2015 [employer]. This information is not intended to replace advice given to you by your health care provider. Make sure you discuss any questions you have with your health care provider.

## 2015-03-11 NOTE — Progress Notes (Signed)
Pre visit review using our clinic review tool, if applicable. No additional management support is needed unless otherwise documented below in the visit note. 

## 2015-03-11 NOTE — Progress Notes (Signed)
Subjective:    Patient ID: Rose Rose, female    DOB: [birth year], 59 y.o.   MRN: [id number]  HPI 59 year old white female, nonsmoker is in today for recheck of anxiety and depression. At her last office visit we added Effexor 37.5 mg to Celexa 40 mg. Reports doing well. Decrease anxiety and better control of her depression.  Has complaints of right foot pain 2 weeks off and on. Pain is worse with walking and with riding her bike. Pain located to the mid plantar aspect of her foot. Rates the pain a 4 out of 10. Takes Aleve daily for osteoarthritis.   Review of Systems  Constitutional: Negative.   HENT: Negative.   Respiratory: Negative.   Cardiovascular: Negative.   Gastrointestinal: Negative.   Endocrine: Negative.   Genitourinary: Negative.   Musculoskeletal:       Right foot pain   Skin: Negative.   Allergic/Immunologic: Negative.   Neurological: Negative.   Psychiatric/Behavioral: Negative.    Past Medical History  Diagnosis Date  . Hyperlipidemia   . Hypertension   . Arthritis     History   Social History  . Marital Status: Married    Spouse Name: N/A  . Number of Children: N/A  . Years of Education: N/A   Occupational History  . Not on file.   Social History Main Topics  . Smoking status: Former Smoker    Quit date: 10/22/2002  . Smokeless tobacco: Never Used  . Alcohol Use: No  . Drug Use: No  . Sexual Activity: Not on file   Other Topics Concern  . Not on file   Social History Narrative    Past Surgical History  Procedure Laterality Date  . Tonsillectomy    . Myringotomy      Right ear  . Total knee arthroplasty  05/2011    Dr. Maureen Ralphs    Family History  Problem Relation Age of Onset  . Alzheimer's disease Maternal Aunt   . Arthritis Mother   . Vision loss Mother   . Dementia Father   . Parkinsonism Father     Allergies  Allergen Reactions  . Amoxicillin-Pot Clavulanate     REACTION: swelling    Current Outpatient Prescriptions  on File Prior to Visit  Medication Sig Dispense Refill  . Ascorbic Acid (VITAMIN C) 100 MG tablet Take 100 mg by mouth daily.      . Black Cohosh (REMIFEMIN) 20 MG TABS Take by mouth 2 (two) times daily.      Marland Kitchen CINNAMON PO Take by mouth.    . citalopram (CELEXA) 20 MG tablet TAKE 1 TABLET TWICE A DAY 180 tablet 0  . diazepam (VALIUM) 2 MG tablet Take 1 tablet (2 mg total) by mouth every 12 (twelve) hours as needed for anxiety. 180 tablet 2  . fluticasone (FLONASE) 50 MCG/ACT nasal spray Place 2 sprays into both nostrils daily.     . hydrochlorothiazide (HYDRODIURIL) 25 MG tablet TAKE 1/2 TO 1 TABLET (=12.5TO 25 MG) DAILY 90 tablet 0  . Multiple Vitamin (MULTIVITAMIN) capsule Take 1 capsule by mouth daily.    Marland Kitchen neomycin-polymyxin-hydrocortisone (CORTISPORIN) 3.5-10000-1 otic suspension Place 3 drops into the right ear 4 (four) times daily. 10 mL 0  . pyridoxine (B-6) 100 MG tablet Take 100 mg by mouth daily.      . simvastatin (ZOCOR) 20 MG tablet TAKE 1 TABLET DAILY 90 tablet 0  . Tapentadol HCl (NUCYNTA) 75 MG TABS Take 1 tablet (75 mg total) by  mouth 2 (two) times daily. 60 tablet 0  . tiZANidine (ZANAFLEX) 4 MG tablet TAKE 1 TABLET EVERY 8 HOURSAS NEEDED 90 tablet 1  . traMADol (ULTRAM) 50 MG tablet TAKE 1 TABLET EVERY 6 HOURSAS NEEDED FOR PAIN 180 tablet 3  . valACYclovir (VALTREX) 500 MG tablet TAKE 1 TABLET DAILY AS     NEEDED 90 tablet 0  . venlafaxine XR (EFFEXOR-XR) 37.5 MG 24 hr capsule Take 1 capsule (37.5 mg total) by mouth daily with breakfast. 30 capsule 3   No current facility-administered medications on file prior to visit.    BP 158/96 mmHg  Pulse 84  Temp(Src) 98.4 F (36.9 C) (Oral)  Wt 220 lb (99.791 kg)chart     Objective:   Physical Exam  Constitutional: She is oriented to person, place, and time. She appears well-developed and well-nourished.  Neck: Normal range of motion. Neck supple.  Cardiovascular: Normal rate, regular rhythm and normal heart sounds.     Pulmonary/Chest: Effort normal and breath sounds normal.  Musculoskeletal: She exhibits tenderness.       Feet:  Neurological: She is alert and oriented to person, place, and time.  Skin: Skin is warm and dry.  Psychiatric: She has a normal mood and affect.          Assessment & Plan:  Leontine was seen today for follow-up.  Diagnoses and all orders for this visit:  Generalized anxiety disorder  Plantar fasciitis of right foot  Other orders -     ibuprofen (ADVIL,MOTRIN) 800 MG tablet; Take 1 tablet (800 mg total) by mouth every 8 (eight) hours as needed.   Call the office with any questions or concerns. Consider x-ray of the right foot if ibuprofen does not prove to be effective for her.

## 2015-03-13 ENCOUNTER — Emergency Department (HOSPITAL_COMMUNITY)
Admission: EM | Admit: 2015-03-13 | Discharge: 2015-03-13 | Disposition: A | Discharge status: Home / Self Care | Payer: BLUE CROSS/BLUE SHIELD | Attending: Emergency Medicine | Admitting: Emergency Medicine

## 2015-03-13 ENCOUNTER — Encounter (HOSPITAL_COMMUNITY): Payer: Self-pay | Admitting: *Deleted

## 2015-03-13 ENCOUNTER — Emergency Department (HOSPITAL_COMMUNITY): Payer: BLUE CROSS/BLUE SHIELD

## 2015-03-13 DIAGNOSIS — Z7951 Long term (current) use of inhaled steroids: Secondary | ICD-10-CM | POA: Insufficient documentation

## 2015-03-13 DIAGNOSIS — Z79899 Other long term (current) drug therapy: Secondary | ICD-10-CM | POA: Insufficient documentation

## 2015-03-13 DIAGNOSIS — Z880 Allergy status to penicillin: Secondary | ICD-10-CM | POA: Insufficient documentation

## 2015-03-13 DIAGNOSIS — M79671 Pain in right foot: Secondary | ICD-10-CM

## 2015-03-13 DIAGNOSIS — E785 Hyperlipidemia, unspecified: Secondary | ICD-10-CM | POA: Diagnosis not present

## 2015-03-13 DIAGNOSIS — M1990 Unspecified osteoarthritis, unspecified site: Secondary | ICD-10-CM | POA: Diagnosis not present

## 2015-03-13 DIAGNOSIS — W231XXA Caught, crushed, jammed, or pinched between stationary objects, initial encounter: Secondary | ICD-10-CM | POA: Insufficient documentation

## 2015-03-13 DIAGNOSIS — Z87891 Personal history of nicotine dependence: Secondary | ICD-10-CM | POA: Insufficient documentation

## 2015-03-13 DIAGNOSIS — Y929 Unspecified place or not applicable: Secondary | ICD-10-CM | POA: Diagnosis not present

## 2015-03-13 DIAGNOSIS — S99921A Unspecified injury of right foot, initial encounter: Secondary | ICD-10-CM | POA: Diagnosis not present

## 2015-03-13 DIAGNOSIS — I10 Essential (primary) hypertension: Secondary | ICD-10-CM | POA: Insufficient documentation

## 2015-03-13 DIAGNOSIS — Y999 Unspecified external cause status: Secondary | ICD-10-CM | POA: Insufficient documentation

## 2015-03-13 DIAGNOSIS — Y939 Activity, unspecified: Secondary | ICD-10-CM | POA: Diagnosis not present

## 2015-03-13 NOTE — ED Notes (Signed)
Placed pt right foot in a cam walker. Pt did not want the crutches.

## 2015-03-13 NOTE — ED Notes (Signed)
Pt refused crutches.

## 2015-03-13 NOTE — ED Notes (Signed)
Pt in c/o injury right foot after a fall today, states she tripped over a brick and rolled her foot, increased pain with ambulation, no distress noted

## 2015-03-13 NOTE — ED Provider Notes (Signed)
CSN: [id number]     Arrival date & time 03/13/15  1901 History  This chart was scribed for non-physician practitioner working, [employer], PA-C, with Ezequiel Essex, MD, by Jeanell Sparrow, ED Scribe. This patient was seen in room TR08C/TR08C and the patient's care was started at 7:49 PM.   Chief Complaint  Patient presents with  . Foot Injury   Patient is a 59 y.o. Rose presenting with foot injury. The history is provided by the patient. No language interpreter was used.  Foot Injury Location:  Foot Time since incident:  4 hours Injury: yes   Mechanism of injury comment:  Struck foot against a brick Foot location:  R foot Pain details:    Quality:  Aching   Radiates to:  Does not radiate   Severity:  Moderate   Onset quality:  Sudden   Duration:  4 hours   Timing:  Constant   Progression:  Improving Chronicity:  New Dislocation: no   Foreign body present:  No foreign bodies Relieved by:  Ice (Nucynta) Worsened by:  Bearing weight and activity Ineffective treatments:  None tried Associated symptoms: swelling   Associated symptoms: no back pain, no decreased ROM, no fever, no muscle weakness, no neck pain, no numbness and no tingling    HPI Comments: Anab Vivar is a 59 y.o. Rose with a PMHx of HLD, HTN, and arthritis, who presents to the Emergency Department complaining of right foot injury that occurred about 4 hours ago. She reports that she jammed her right foot into a brick step about 4 hours ago. She denies any LOC or head injury. She reports that she has constant moderate right lateral foot pain with associated swelling. She currently rates the severity of the pain as a 7/10, and describes the pain as an achy sensation, which is nonradiating. She states that ambulation and bearing weight on the right foot exacerbates the pain. She reports icing the affected area and taking nucynta pain medication, both with relief. She denies any bruising, wounds, numbness,  tingling, weakness, fevers, chills, chest pain, SOB, abd pain, nausea, vomiting, lightheadedness, or other injuries.       Past Medical History  Diagnosis Date  . Hyperlipidemia   . Hypertension   . Arthritis    Past Surgical History  Procedure Laterality Date  . Tonsillectomy    . Myringotomy      Right ear  . Total knee arthroplasty  05/2011    Dr. Maureen Ralphs   Family History  Problem Relation Age of Onset  . Alzheimer's disease Maternal Aunt   . Arthritis Mother   . Vision loss Mother   . Dementia Father   . Parkinsonism Father    History  Substance Use Topics  . Smoking status: Former Smoker    Quit date: 10/22/2002  . Smokeless tobacco: Never Used  . Alcohol Use: No   OB History    No data available     Review of Systems  Constitutional: Negative for fever and chills.  HENT: Negative for facial swelling (no head injury).   Respiratory: Negative for shortness of breath.   Cardiovascular: Negative for chest pain.  Gastrointestinal: Negative for nausea, vomiting and abdominal pain.  Musculoskeletal: Positive for joint swelling (R foot) and arthralgias (R foot). Negative for myalgias, back pain and neck pain.  Skin: Negative for color change and wound.  Allergic/Immunologic: Negative for immunocompromised state.  Neurological: Negative for syncope, weakness, light-headedness and numbness.  Hematological: Does not bruise/bleed easily.  10  Systems reviewed and all are negative for acute change except as noted in the HPI.  Allergies  Amoxicillin-pot clavulanate  Home Medications   Prior to Admission medications   Medication Sig Start Date End Date Taking? Authorizing Provider  Ascorbic Acid (VITAMIN C) 100 MG tablet Take 100 mg by mouth daily.      Historical Provider, MD  Black Cohosh (REMIFEMIN) 20 MG TABS Take by mouth 2 (two) times daily.      Historical Provider, MD  CINNAMON PO Take by mouth.    Historical Provider, MD  citalopram (CELEXA) 20 MG tablet TAKE  1 TABLET TWICE A DAY 01/24/15   Kennyth Arnold, FNP  diazepam (VALIUM) 2 MG tablet Take 1 tablet (2 mg total) by mouth every 12 (twelve) hours as needed for anxiety. 11/09/14   Kennyth Arnold, FNP  fluticasone (FLONASE) 50 MCG/ACT nasal spray Place 2 sprays into both nostrils daily.  01/02/15   Historical Provider, MD  hydrochlorothiazide (HYDRODIURIL) 25 MG tablet TAKE 1/2 TO 1 TABLET (=12.5TO 25 MG) DAILY 10/25/14   Kennyth Arnold, FNP  ibuprofen (ADVIL,MOTRIN) 800 MG tablet Take 1 tablet (800 mg total) by mouth every 8 (eight) hours as needed. 03/11/15   Kennyth Arnold, FNP  Multiple Vitamin (MULTIVITAMIN) capsule Take 1 capsule by mouth daily.    Historical Provider, MD  neomycin-polymyxin-hydrocortisone (CORTISPORIN) 3.5-10000-1 otic suspension Place 3 drops into the right ear 4 (four) times daily. 12/31/14   Kennyth Arnold, FNP  pyridoxine (B-6) 100 MG tablet Take 100 mg by mouth daily.      Historical Provider, MD  simvastatin (ZOCOR) 20 MG tablet TAKE 1 TABLET DAILY 10/25/14   Kennyth Arnold, FNP  Tapentadol HCl (NUCYNTA) 75 MG TABS Take 1 tablet (75 mg total) by mouth 2 (two) times daily. 07/06/14   Kennyth Arnold, FNP  tiZANidine (ZANAFLEX) 4 MG tablet TAKE 1 TABLET EVERY 8 HOURSAS NEEDED 10/19/14   Kennyth Arnold, FNP  traMADol (ULTRAM) 50 MG tablet TAKE 1 TABLET EVERY 6 HOURSAS NEEDED FOR PAIN 02/15/15   Kennyth Arnold, FNP  valACYclovir (VALTREX) 500 MG tablet TAKE 1 TABLET DAILY AS     NEEDED 01/07/15   Kennyth Arnold, FNP  venlafaxine XR (EFFEXOR-XR) 37.5 MG 24 hr capsule Take 1 capsule (37.5 mg total) by mouth daily with breakfast. 01/28/15   Kennyth Arnold, FNP   BP 151/81 mmHg  Pulse 81  Temp(Src) 98.5 F (36.9 C)  Resp 18  Ht 5\' 3"  (1.6 m)  Wt 226 lb 9.6 oz (102.785 kg)  BMI 40.15 kg/m2  SpO2 97% Physical Exam  Constitutional: She is oriented to person, place, and time. Vital signs are normal. She appears well-developed and well-nourished.  Non-toxic appearance. No distress.  Afebrile,  nontoxic, NAD  HENT:  Head: Normocephalic and atraumatic.  Mouth/Throat: Mucous membranes are normal.  Eyes: Conjunctivae and EOM are normal. Right eye exhibits no discharge. Left eye exhibits no discharge.  Neck: Normal range of motion. Neck supple.  Cardiovascular: Normal rate and intact distal pulses.   Pulmonary/Chest: Effort normal. No respiratory distress.  Abdominal: Normal appearance. She exhibits no distension.  Musculoskeletal: Normal range of motion.       Right ankle: Normal.       Right lower leg: Normal.       Right foot: There is tenderness, bony tenderness and swelling. There is normal range of motion, normal capillary refill, no deformity and no laceration.  Feet:  R foot with TTP over 3rd-5th metatarsals, with mild swelling noted to base of 5th metatarsal, no bruising or deformity, no skin injury, wiggles all digits with ease, cap refill brisk and present, distal pulses intact, sensation grossly intact, strength intact. R ankle and calf nonTTP with FROM intact.   Neurological: She is alert and oriented to person, place, and time. She has normal strength. No sensory deficit.  Skin: Skin is warm, dry and intact. No abrasion, no bruising and no rash noted.  Skin intact, no bruising  Psychiatric: She has a normal mood and affect. Her behavior is normal.  Nursing note and vitals reviewed.   ED Course  Procedures (including critical care time) DIAGNOSTIC STUDIES: Oxygen Saturation is 97% on RA, normal by my interpretation.    COORDINATION OF CARE: 7:53 PM- Pt advised of plan for treatment which includes radiology and pt agrees.  Labs Review Labs Reviewed - No data to display  Imaging Review Dg Foot Complete Right  03/13/2015   CLINICAL DATA:  Patient with pain and swelling along the lateral aspect of the right foot.  EXAM: RIGHT FOOT COMPLETE - 3+ VIEW  COMPARISON:  None.  FINDINGS: Limited evaluation of the midfoot due to patient positioning. Nonspecific  calcification about the lateral aspect of the calcaneus. Posterior and plantar calcaneal spurring. Normal anatomic alignment. No definite evidence for acute displaced fracture. Midfoot degenerative changes.  IMPRESSION: Nonspecific calcification about the lateral aspect of the calcaneus, potentially sequelae of prior trauma. Consider correlation with dedicated ankle radiographs as clinically indicated.  Limited evaluation of the midfoot due to patient positioning.  Within the above limitation, no definite evidence for acute fracture.   Electronically Signed   By: Lovey Newcomer M.D.   On: 03/13/2015 20:15     EKG Interpretation None      MDM   Final diagnoses:  Right foot pain  Right foot injury, initial encounter    59 y.o. Rose here with R foot pain after hitting it against a brick PTA. Neurovascularly intact with soft compartments. TTP along 3rd-5th metatarsals, mild swelling noted to 5th metatarsal. Wiggles all digits with ease, no skin injuries. Will obtain xray imaging to eval for fx. Pt declines pain meds, took some PTA. Will monitor.  8:26 PM Xray showing calcification at lateral calcaneus, near her site of tenderness, but this seems like an old injury. Pt states she's had pain in this ankle for ~8wks, unknown injury. Will treat conservatively with CAM walker and crutches as needed for comfort, but doubt this is an acute fracture since the calcification is very smooth. No obvious acute fractures otherwise. Will have her f/up with her orthopedist at [facility], and use her home pain meds as needed for pain. Discussed RICE therapy. I explained the diagnosis and have given explicit precautions to return to the ER including for any other new or worsening symptoms. The patient understands and accepts the medical plan as it's been dictated and I have answered their questions. Discharge instructions concerning home care and prescriptions have been given. The patient is STABLE and is discharged to home in  good condition.   I personally performed the services described in this documentation, which was scribed in my presence. The recorded information has been reviewed and is accurate.  BP 151/81 mmHg  Pulse 81  Temp(Src) 98.5 F (36.9 C)  Resp 18  Ht 5\' 3"  (1.6 m)  Wt 226 lb 9.6 oz (102.785 kg)  BMI 40.15 kg/m2  SpO2 97%  Baley Lorimer Camprubi-Soms, PA-C 03/13/15 2028  Ezequiel Essex, MD 03/14/15 [phone]

## 2015-03-13 NOTE — Discharge Instructions (Signed)
Wear cam walker for all weight bearing, use crutches as needed for comfort. Ice and elevate foot throughout the day. Use your home pain medications for pain. Call your orthopedic doctor for follow up in 1-2 weeks for recheck of foot pain. Return to the ER for changes or worsening symptoms.    Blunt Trauma You have been evaluated for injuries. You have been examined and your caregiver has not found injuries serious enough to require hospitalization. It is common to have multiple bruises and sore muscles following an accident. These tend to feel worse for the first 24 hours. You will feel more stiffness and soreness over the next several hours and worse when you wake up the first morning after your accident. After this point, you should begin to improve with each passing day. The amount of improvement depends on the amount of damage done in the accident. Following your accident, if some part of your body does not work as it should, or if the pain in any area continues to increase, you should return to the Emergency Department for re-evaluation.  HOME CARE INSTRUCTIONS  Routine care for sore areas should include:  Ice to sore areas every 2 hours for 20 minutes while awake for the next 2 days.  Drink extra fluids (not alcohol).  Take a hot or warm shower or bath once or twice a day to increase blood flow to sore muscles. This will help you "limber up".  Activity as tolerated. Lifting may aggravate neck or back pain.  Only take over-the-counter or prescription medicines for pain, discomfort, or fever as directed by your caregiver. Do not use aspirin. This may increase bruising or increase bleeding if there are small areas where this is happening. SEEK IMMEDIATE MEDICAL CARE IF:  Numbness, tingling, weakness, or problem with the use of your arms or legs.  A severe headache is not relieved with medications.  There is a change in bowel or bladder control.  Increasing pain in any areas of the  body.  Short of breath or dizzy.  Nauseated, vomiting, or sweating.  Increasing belly (abdominal) discomfort.  Blood in urine, stool, or vomiting blood.  Pain in either shoulder in an area where a shoulder strap would be.  Feelings of lightheadedness or if you have a fainting episode. Sometimes it is not possible to identify all injuries immediately after the trauma. It is important that you continue to monitor your condition after the emergency department visit. If you feel you are not improving, or improving more slowly than should be expected, call your physician. If you feel your symptoms (problems) are worsening, return to the Emergency Department immediately. Document Released: 07/04/2001 Document Revised: 12/31/2011 Document Reviewed: 05/26/2008 [facility] Patient Information 2015 [place], [place]. This information is not intended to replace advice given to you by your health care provider. Make sure you discuss any questions you have with your health care provider.  Cryotherapy Cryotherapy is when you put ice on your injury. Ice helps lessen pain and puffiness (swelling) after an injury. Ice works the best when you start using it in the first 24 to 48 hours after an injury. HOME CARE  Put a dry or damp towel between the ice pack and your skin.  You may press gently on the ice pack.  Leave the ice on for no more than 10 to 20 minutes at a time.  Check your skin after 5 minutes to make sure your skin is okay.  Rest at least 20 minutes between ice  pack uses.  Stop using ice when your skin loses feeling (numbness).  Do not use ice on someone who cannot tell you when it hurts. This includes small children and people with memory problems (dementia). GET HELP RIGHT AWAY IF:  You have white spots on your skin.  Your skin turns blue or pale.  Your skin feels waxy or hard.  Your puffiness gets worse. MAKE SURE YOU:   Understand these instructions.  Will watch your  condition.  Will get help right away if you are not doing well or get worse. Document Released: 03/26/2008 Document Revised: 12/31/2011 Document Reviewed: 05/31/2011 [facility] Patient Information 2015 [place], [place]. This information is not intended to replace advice given to you by your health care provider. Make sure you discuss any questions you have with your health care provider.

## 2015-03-17 ENCOUNTER — Telehealth: Payer: Self-pay | Admitting: Family

## 2015-03-17 NOTE — Telephone Encounter (Signed)
Pt needs new rx nucynta for pain. Pt has started back taking med. Pt is aware np out of office

## 2015-03-22 NOTE — Telephone Encounter (Signed)
Ok to fill 

## 2015-03-24 MED ORDER — TAPENTADOL HCL 75 MG PO TABS: 75.0000 mg | ORAL_TABLET | Freq: Two times a day (BID) | ORAL | Status: DC

## 2015-03-24 NOTE — Telephone Encounter (Signed)
Ok to fill this time but will have to see the pain clinic. We do not currently have any full-time providers who will manage chronic opioids.

## 2015-03-24 NOTE — Telephone Encounter (Signed)
Rx printed and pt will pick up tomorrow

## 2015-03-27 ENCOUNTER — Other Ambulatory Visit: Payer: Self-pay | Admitting: Family

## 2015-03-31 ENCOUNTER — Other Ambulatory Visit: Payer: Self-pay | Admitting: Family

## 2015-04-06 ENCOUNTER — Other Ambulatory Visit: Payer: Self-pay | Admitting: Family

## 2015-04-09 ENCOUNTER — Other Ambulatory Visit: Payer: Self-pay | Admitting: Family

## 2015-05-05 ENCOUNTER — Other Ambulatory Visit: Payer: Self-pay | Admitting: Family Medicine

## 2015-05-05 MED ORDER — CITALOPRAM HYDROBROMIDE 20 MG PO TABS: 20.0000 mg | ORAL_TABLET | Freq: Two times a day (BID) | ORAL | Status: DC

## 2015-05-05 NOTE — Telephone Encounter (Signed)
Sent to the pharmacy for 90 days.  Pt has upcoming establish care appt with [facility] on 06/06/15 Per last [place] note, pt doing well on medication and effexor.

## 2015-05-09 ENCOUNTER — Ambulatory Visit
Admission: RE | Admit: 2015-05-09 | Discharge: 2015-05-09 | Disposition: A | Discharge status: Home / Self Care | Payer: BLUE CROSS/BLUE SHIELD | Source: Ambulatory Visit | Attending: Family | Admitting: Family

## 2015-05-09 DIAGNOSIS — Z1231 Encounter for screening mammogram for malignant neoplasm of breast: Secondary | ICD-10-CM

## 2015-06-03 ENCOUNTER — Other Ambulatory Visit: Payer: Self-pay | Admitting: *Deleted

## 2015-06-03 ENCOUNTER — Telehealth: Payer: Self-pay | Admitting: Family Medicine

## 2015-06-03 MED ORDER — TIZANIDINE HCL 4 MG PO TABS: ORAL_TABLET | ORAL | Status: DC

## 2015-06-03 NOTE — Telephone Encounter (Signed)
Rx sent in

## 2015-06-03 NOTE — Telephone Encounter (Signed)
Refill request for Tizanidine 4 mg take 1 po q8 hrs prn and a 90 day supply to [facility] mail order.

## 2015-06-06 ENCOUNTER — Ambulatory Visit: Payer: BLUE CROSS/BLUE SHIELD | Admitting: Adult Health

## 2015-06-09 ENCOUNTER — Encounter: Payer: Self-pay | Admitting: Adult Health

## 2015-06-09 ENCOUNTER — Ambulatory Visit (INDEPENDENT_AMBULATORY_CARE_PROVIDER_SITE_OTHER): Payer: BLUE CROSS/BLUE SHIELD | Admitting: Adult Health

## 2015-06-09 VITALS — BP 140/100 | Temp 99.1°F | Ht 63.0 in | Wt 214.1 lb

## 2015-06-09 DIAGNOSIS — Z7189 Other specified counseling: Secondary | ICD-10-CM

## 2015-06-09 DIAGNOSIS — M62838 Other muscle spasm: Secondary | ICD-10-CM | POA: Diagnosis not present

## 2015-06-09 DIAGNOSIS — Z7689 Persons encountering health services in other specified circumstances: Secondary | ICD-10-CM

## 2015-06-09 MED ORDER — DIAZEPAM 2 MG PO TABS: 2.0000 mg | ORAL_TABLET | Freq: Two times a day (BID) | ORAL | Status: DC | PRN

## 2015-06-09 NOTE — Patient Instructions (Addendum)
It is great meeting you today.   Please follow up with me for a complete physical and let me know if you need anything in the meantime.

## 2015-06-09 NOTE — Progress Notes (Signed)
HPI:  Rose Rose is here to establish care. She is a very pleasant Caucasian former smoker with  has a past medical history of Hyperlipidemia; Hypertension; and Arthritis.  Last PCP and physical: 11/2013  Immunizations:UTD Diet:Eats a healthy diet. Exercise: She does exercise and has been losing weight.  Colonoscopy:Has not had one and does not want to have one at this time.  Pap Smear:No abnormal. Mammogram:05/10/2015 Does get eye exams Does not see a dentist.    Has the following chronic problems that require follow up and concerns today:  HTN - She feels as though her BP is controlled on current medications. She is exercising and has lost 9 pounds since her last visit in May. Unfortunantly her exercise has been limited due to tearing ligaments in her right foot. She is to be evaluated for surgery in September.   Depression - Feels as though this is controlled with Celexa and Effexor. Is not seen by psychiatry and does not feel like she needs to be at this time.     ROS negative for unless reported above: fevers, chills,feeling poorly, unintentional weight loss, hearing or vision loss, chest pain, palpitations, leg claudication, struggling to breath,Not feeling congested in the chest, no orthopenia, no cough,no wheezing, normal appetite, no soft tissue swelling, no hemoptysis, melena, hematochezia, hematuria, falls, loc, si, or thoughts of self harm.    Past Medical History  Diagnosis Date  . Hyperlipidemia   . Hypertension   . Arthritis     Past Surgical History  Procedure Laterality Date  . Tonsillectomy    . Myringotomy      Right ear  . Total knee arthroplasty  05/2011    Dr. Maureen Ralphs    Family History  Problem Relation Age of Onset  . Alzheimer's disease Maternal Aunt   . Arthritis Mother   . Vision loss Mother   . Dementia Father   . Parkinsonism Father     Social History   Social History  . Marital Status: Married    Spouse Name: N/A  . Number of  Children: N/A  . Years of Education: N/A   Social History Main Topics  . Smoking status: Former Smoker    Quit date: 10/22/2002  . Smokeless tobacco: Never Used  . Alcohol Use: No  . Drug Use: No  . Sexual Activity: Not Asked   Other Topics Concern  . None   Social History Narrative     Current outpatient prescriptions:  .  Ascorbic Acid (VITAMIN C) 100 MG tablet, Take 100 mg by mouth daily.  , Disp: , Rfl:  .  Black Cohosh (REMIFEMIN) 20 MG TABS, Take by mouth 2 (two) times daily.  , Disp: , Rfl:  .  CINNAMON PO, Take by mouth., Disp: , Rfl:  .  CIPRODEX otic suspension, , Disp: , Rfl:  .  citalopram (CELEXA) 20 MG tablet, Take 1 tablet (20 mg total) by mouth 2 (two) times daily., Disp: 180 tablet, Rfl: 0 .  diazepam (VALIUM) 2 MG tablet, Take 1 tablet (2 mg total) by mouth every 12 (twelve) hours as needed for anxiety., Disp: 180 tablet, Rfl: 2 .  fluticasone (FLONASE) 50 MCG/ACT nasal spray, Place 2 sprays into both nostrils daily. , Disp: , Rfl:  .  hydrochlorothiazide (HYDRODIURIL) 25 MG tablet, TAKE 1/2 TO 1 TABLET (=12.5TO 25 MG) DAILY, Disp: 90 tablet, Rfl: 0 .  Multiple Vitamin (MULTIVITAMIN) capsule, Take 1 capsule by mouth daily., Disp: , Rfl:  .  neomycin-polymyxin-hydrocortisone (  CORTISPORIN) 3.5-10000-1 otic suspension, Place 3 drops into the right ear 4 (four) times daily., Disp: 10 mL, Rfl: 0 .  olopatadine (PATANOL) 0.1 % ophthalmic solution, , Disp: , Rfl:  .  pyridoxine (B-6) 100 MG tablet, Take 100 mg by mouth daily.  , Disp: , Rfl:  .  simvastatin (ZOCOR) 20 MG tablet, TAKE 1 TABLET DAILY, Disp: 90 tablet, Rfl: 0 .  Tapentadol HCl (NUCYNTA) 75 MG TABS, Take 1 tablet (75 mg total) by mouth 2 (two) times daily., Disp: 60 tablet, Rfl: 0 .  tiZANidine (ZANAFLEX) 4 MG tablet, TAKE 1 TABLET EVERY 8 HOURSAS NEEDED, Disp: 90 tablet, Rfl: 1 .  traMADol (ULTRAM) 50 MG tablet, TAKE 1 TABLET EVERY 6 HOURSAS NEEDED FOR PAIN, Disp: 180 tablet, Rfl: 3 .  valACYclovir  (VALTREX) 500 MG tablet, TAKE 1 TABLET DAILY AS     NEEDED, Disp: 90 tablet, Rfl: 0 .  venlafaxine XR (EFFEXOR-XR) 37.5 MG 24 hr capsule, Take 1 capsule (37.5 mg total) by mouth daily with breakfast., Disp: 30 capsule, Rfl: 3  EXAM:  Filed Vitals:   06/09/15 1538  BP: 140/100  Temp: 99.1 F (37.3 C)    Body mass index is 37.94 kg/(m^2).  GENERAL: vitals reviewed and listed above, alert, oriented, appears well hydrated and in no acute distress. Slightly obese  HEENT: atraumatic, conjunttiva clear, scar tissue on TM in right ear, has had surgery on this ear.   NECK: Neck is soft and supple without masses, no adenopathy or thyromegaly, trachea midline, no JVD. Normal range of motion.   LUNGS: clear to auscultation bilaterally, no wheezes, rales or rhonchi, good air movement  CV: Regular rate and rhythm, normal S1/S2, no audible murmurs, gallops, or rubs. No carotid bruit and no peripheral edema.   MS: moves all extremities without noticeable abnormality. No edema noted. Has torn ligaments in right ankle. Is going to be evaluated for surgery in September  Abd: soft/nontender/nondistended/normal bowel sounds   Skin: warm and dry, no rash   Extremities: No clubbing, cyanosis, or edema. Capillary refill is WNL. Pulses intact bilaterally in upper and lower extremities.   Neuro: CN II-XII intact, sensation and reflexes normal throughout, 5/5 muscle strength in bilateral upper and lower extremities. Normal finger to nose. Normal rapid alternating movements. Normal romberg.   PSYCH: pleasant and cooperative, no obvious depression or anxiety  ASSESSMENT AND PLAN:  1. Encounter to establish care - Follow up in one month for CPE - Follow up sooner if needed - Do as much exercise as you can  - Continue to eat healthy  2. Muscle spasm - diazepam (VALIUM) 2 MG tablet; Take 1 tablet (2 mg total) by mouth every 12 (twelve) hours as needed for anxiety.  Dispense: 60 tablet; Refill: 0 -  She is willing and would like to start tapering down on Valium.    -We reviewed the PMH, PSH, FH, SH, Meds and Allergies. -We provided refills for any medications we will prescribe as needed. -We addressed current concerns per orders and patient instructions. -We have asked for records for pertinent exams, studies, vaccines and notes from previous providers. -We have advised patient to follow up per instructions below.   -Patient advised to return or notify a provider immediately if symptoms worsen or persist or new concerns arise.  There are no Patient Instructions on file for this visit.   Dorothyann Peng, AGNP

## 2015-06-09 NOTE — Progress Notes (Signed)
Pre visit review using our clinic review tool, if applicable. No additional management support is needed unless otherwise documented below in the visit note. 

## 2015-06-14 ENCOUNTER — Other Ambulatory Visit: Payer: Self-pay | Admitting: *Deleted

## 2015-06-14 MED ORDER — VENLAFAXINE HCL ER 37.5 MG PO CP24: 37.5000 mg | ORAL_CAPSULE | Freq: Every day | ORAL | Status: DC

## 2015-06-14 MED ORDER — CITALOPRAM HYDROBROMIDE 20 MG PO TABS: 20.0000 mg | ORAL_TABLET | Freq: Two times a day (BID) | ORAL | Status: DC

## 2015-06-14 MED ORDER — HYDROCHLOROTHIAZIDE 25 MG PO TABS: ORAL_TABLET | ORAL | Status: DC

## 2015-07-01 ENCOUNTER — Other Ambulatory Visit (INDEPENDENT_AMBULATORY_CARE_PROVIDER_SITE_OTHER): Payer: BLUE CROSS/BLUE SHIELD

## 2015-07-01 DIAGNOSIS — Z0000 Encounter for general adult medical examination without abnormal findings: Secondary | ICD-10-CM | POA: Diagnosis not present

## 2015-07-01 DIAGNOSIS — R7989 Other specified abnormal findings of blood chemistry: Secondary | ICD-10-CM | POA: Diagnosis not present

## 2015-07-01 LAB — BASIC METABOLIC PANEL
BUN: 13 mg/dL (ref 6–23)
CO2: 30 meq/L (ref 19–32)
Calcium: 9.8 mg/dL (ref 8.4–10.5)
Chloride: 101 mEq/L (ref 96–112)
Creatinine, Ser: 0.7 mg/dL (ref 0.40–1.20)
GFR: 91.36 mL/min (ref >60.00)
GLUCOSE: 118 mg/dL — AB (ref 70–99)
POTASSIUM: 4.4 meq/L (ref 3.5–5.1)
SODIUM: 141 meq/L (ref 135–145)

## 2015-07-01 LAB — HEPATIC FUNCTION PANEL
ALBUMIN: 4.5 g/dL (ref 3.5–5.2)
ALT: 20 U/L (ref 0–35)
AST: 18 U/L (ref 0–37)
Alkaline Phosphatase: 75 U/L (ref 39–117)
Bilirubin, Direct: 0.1 mg/dL (ref 0.0–0.3)
TOTAL PROTEIN: 7.3 g/dL (ref 6.0–8.3)
Total Bilirubin: 0.6 mg/dL (ref 0.2–1.2)

## 2015-07-01 LAB — CBC WITH DIFFERENTIAL/PLATELET
Basophils Absolute: 0 10*3/uL (ref 0.0–0.1)
Basophils Relative: 0.4 % (ref 0.0–3.0)
EOS PCT: 0.3 % (ref 0.0–5.0)
Eosinophils Absolute: 0 10*3/uL (ref 0.0–0.7)
HEMATOCRIT: 45.9 % (ref 36.0–46.0)
Hemoglobin: 15 g/dL (ref 12.0–15.0)
LYMPHS ABS: 2.8 10*3/uL (ref 0.7–4.0)
LYMPHS PCT: 30.1 % (ref 12.0–46.0)
MCHC: 32.7 g/dL (ref 30.0–36.0)
MCV: 88.6 fl (ref 78.0–100.0)
MONOS PCT: 5.8 % (ref 3.0–12.0)
Monocytes Absolute: 0.5 10*3/uL (ref 0.1–1.0)
NEUTROS ABS: 5.8 10*3/uL (ref 1.4–7.7)
NEUTROS PCT: 63.4 % (ref 43.0–77.0)
Platelets: 307 10*3/uL (ref 150.0–400.0)
RBC: 5.18 Mil/uL — AB (ref 3.87–5.11)
RDW: 14.7 % (ref 11.5–15.5)
WBC: 9.2 10*3/uL (ref 4.0–10.5)

## 2015-07-01 LAB — POCT URINALYSIS DIPSTICK
Bilirubin, UA: NEGATIVE
GLUCOSE UA: NEGATIVE
KETONES UA: NEGATIVE
Nitrite, UA: NEGATIVE
Protein, UA: NEGATIVE
SPEC GRAV UA: 1.01
UROBILINOGEN UA: 0.2
pH, UA: 7

## 2015-07-01 LAB — LDL CHOLESTEROL, DIRECT: LDL DIRECT: 99 mg/dL

## 2015-07-01 LAB — TSH: TSH: 1.58 u[IU]/mL (ref 0.35–4.50)

## 2015-07-01 LAB — LIPID PANEL
CHOL/HDL RATIO: 3
CHOLESTEROL: 170 mg/dL (ref 0–200)
HDL: 50.6 mg/dL (ref >39.00)
NONHDL: 119.35
Triglycerides: 229 mg/dL — ABNORMAL HIGH (ref 0.0–149.0)
VLDL: 45.8 mg/dL — AB (ref 0.0–40.0)

## 2015-07-01 LAB — HEMOGLOBIN A1C: HEMOGLOBIN A1C: 6 % (ref 4.6–6.5)

## 2015-07-05 ENCOUNTER — Other Ambulatory Visit: Payer: Self-pay | Admitting: Family Medicine

## 2015-07-05 MED ORDER — VENLAFAXINE HCL ER 37.5 MG PO CP24: 37.5000 mg | ORAL_CAPSULE | Freq: Every day | ORAL | Status: DC

## 2015-07-05 NOTE — Telephone Encounter (Signed)
This pt has established with Tom[facility] R[facility].  Has a cpx scheduled for 07/11/15.  [facility] R[facility] has not filled this medication in the past.

## 2015-07-08 ENCOUNTER — Telehealth: Payer: Self-pay | Admitting: Adult Health

## 2015-07-08 ENCOUNTER — Encounter: Payer: BLUE CROSS/BLUE SHIELD | Admitting: Adult Health

## 2015-07-08 ENCOUNTER — Other Ambulatory Visit: Payer: Self-pay

## 2015-07-08 MED ORDER — CITALOPRAM HYDROBROMIDE 20 MG PO TABS: 20.0000 mg | ORAL_TABLET | Freq: Two times a day (BID) | ORAL | Status: DC

## 2015-07-08 NOTE — Telephone Encounter (Signed)
[facility] [phone]  Requesting refill of citalopram (CELEXA) 20 MG tablet

## 2015-07-08 NOTE — Telephone Encounter (Signed)
Ok to refill 

## 2015-07-11 ENCOUNTER — Ambulatory Visit (INDEPENDENT_AMBULATORY_CARE_PROVIDER_SITE_OTHER): Payer: BLUE CROSS/BLUE SHIELD | Admitting: Adult Health

## 2015-07-11 ENCOUNTER — Encounter: Payer: Self-pay | Admitting: Adult Health

## 2015-07-11 DIAGNOSIS — Z0000 Encounter for general adult medical examination without abnormal findings: Secondary | ICD-10-CM | POA: Diagnosis not present

## 2015-07-11 DIAGNOSIS — F329 Major depressive disorder, single episode, unspecified: Secondary | ICD-10-CM | POA: Diagnosis not present

## 2015-07-11 DIAGNOSIS — F32A Depression, unspecified: Secondary | ICD-10-CM

## 2015-07-11 LAB — POCT URINALYSIS DIPSTICK
Bilirubin, UA: NEGATIVE
Glucose, UA: NEGATIVE
KETONES UA: NEGATIVE
NITRITE UA: NEGATIVE
PH UA: 7.5
PROTEIN UA: NEGATIVE
Spec Grav, UA: 1.015
Urobilinogen, UA: 0.2

## 2015-07-11 MED ORDER — DIAZEPAM 2 MG PO TABS: 2.0000 mg | ORAL_TABLET | Freq: Two times a day (BID) | ORAL | Status: DC | PRN

## 2015-07-11 NOTE — Progress Notes (Signed)
Pre visit review using our clinic review tool, if applicable. No additional management support is needed unless otherwise documented below in the visit note. 

## 2015-07-11 NOTE — Progress Notes (Signed)
Subjective:    Patient ID: Rose Rose, female    DOB: May 07, 1956, 59 y.o.   MRN: [id number]  HPI Patient presents for yearly preventative medicine examination.  All immunizations and health maintenance protocols were reviewed with the patient and needed orders were placed.  Appropriate screening laboratory values were reviewed with the patient including screening of hyperlipidemia, renal function, diabetes, and hepatic function.  Medication reconciliation,  past medical history, social history, problem list and allergies were reviewed in detail with the patient  Goals were established with regard to weight loss, exercise, and  diet in compliance with medications  She has a living will and advanced directive  Depression She endorses wanting to start seeing a Therapist for her depression. She feels as though it is well controlled but she would still like to have someone to talk to. Endorses being worried about her upcoming foot surgery and " things going on at work".   Hypertension - Controlled on current medication  Review of Systems  Constitutional: Negative.   HENT: Negative.   Eyes: Negative.   Respiratory: Negative.   Cardiovascular: Negative.   Gastrointestinal: Negative.   Endocrine: Negative.   Genitourinary: Negative.   Musculoskeletal: Negative.   Skin: Negative.   Allergic/Immunologic: Negative.   Neurological: Negative.   Hematological: Negative.   Psychiatric/Behavioral: Negative.   All other systems reviewed and are negative.  Wt Readings from Last 3 Encounters:  07/11/15 207 lb 9.6 oz (94.167 kg)  06/09/15 214 lb 1.6 oz (97.115 kg)  03/13/15 226 lb 9.6 oz (102.785 kg)       Objective:   Physical Exam  Constitutional: She is oriented to person, place, and time. She appears well-developed and well-nourished. No distress.  HENT:  Head: Normocephalic and atraumatic.  Right Ear: External ear normal.  Left Ear: External ear normal.  Nose: Nose normal.    Mouth/Throat: Oropharynx is clear and moist. No oropharyngeal exudate.  Eyes: Conjunctivae are normal. Pupils are equal, round, and reactive to light. Right eye exhibits no discharge. Left eye exhibits no discharge. No scleral icterus.  Neck: Normal range of motion. Neck supple. No JVD present. No tracheal deviation present. No thyromegaly present.  Cardiovascular: Normal rate, regular rhythm, normal heart sounds and intact distal pulses.  Exam reveals no gallop and no friction rub.   No murmur heard. Pulmonary/Chest: Effort normal and breath sounds normal. No stridor. No respiratory distress. She has no wheezes. She has no rales. She exhibits no tenderness.  Abdominal: Soft. Bowel sounds are normal. She exhibits no distension and no mass. There is no tenderness. There is no rebound and no guarding.  Genitourinary:  Pelvic:Refused Breast Exam: No lumps, masses, dimpling or discharge noted  Musculoskeletal: Normal range of motion. She exhibits tenderness (to right foot. Has brace on right ankle due to ligament tear).  Lymphadenopathy:    She has no cervical adenopathy.  Neurological: She is alert and oriented to person, place, and time. She has normal reflexes. No cranial nerve deficit. Coordination normal.  Skin: Skin is warm and dry. No rash noted. No erythema. No pallor.  Skin tags and normal appears nevi scattered throughout body  Psychiatric: She has a normal mood and affect. Her behavior is normal. Judgment and thought content normal.  Nursing note and vitals reviewed.     Assessment & Plan:  1. Routine general medical examination at a health care facility - Follow up in one year for CPE - POCT urinalysis dipstick  2. Depression - List of  therapist and psychiatrist given to patient.  - diazepam (VALIUM) 2 MG tablet; Take 1 tablet (2 mg total) by mouth every 12 (twelve) hours as needed for anxiety.  Dispense: 60 tablet; Refill: 0 - D/C Effexor and continue Celexa

## 2015-07-11 NOTE — Patient Instructions (Addendum)
It was great seeing you again!  Good luck on your surgery and please let me know if you need anything!  Have a great and happy birthday! Do whatever you want today, you deserve it!  Discontinue the Effexor

## 2015-07-12 ENCOUNTER — Telehealth: Payer: Self-pay | Admitting: Adult Health

## 2015-07-12 NOTE — Telephone Encounter (Signed)
Pt states after she left appt  She was riding [place] the highway and her rx for diazepam (VALIUM) 2 MG tablet Flew out the window. [facility]eds [facility]

## 2015-07-14 NOTE — Telephone Encounter (Signed)
Ok to refill 

## 2015-07-18 ENCOUNTER — Other Ambulatory Visit: Payer: Self-pay | Admitting: *Deleted

## 2015-07-18 MED ORDER — TIZANIDINE HCL 4 MG PO TABS: ORAL_TABLET | ORAL | Status: DC

## 2015-08-22 ENCOUNTER — Other Ambulatory Visit: Payer: Self-pay | Admitting: Adult Health

## 2015-08-22 MED ORDER — TIZANIDINE HCL 4 MG PO TABS: ORAL_TABLET | ORAL | Status: DC

## 2015-09-01 ENCOUNTER — Ambulatory Visit: Payer: BLUE CROSS/BLUE SHIELD | Admitting: Adult Health

## 2015-09-09 ENCOUNTER — Other Ambulatory Visit: Payer: Self-pay | Admitting: Family

## 2015-09-22 ENCOUNTER — Other Ambulatory Visit: Payer: Self-pay | Admitting: Family

## 2015-10-06 ENCOUNTER — Ambulatory Visit: Payer: BLUE CROSS/BLUE SHIELD | Admitting: Adult Health

## 2015-10-07 ENCOUNTER — Encounter: Payer: Self-pay | Admitting: Adult Health

## 2015-10-07 ENCOUNTER — Ambulatory Visit (INDEPENDENT_AMBULATORY_CARE_PROVIDER_SITE_OTHER): Payer: BLUE CROSS/BLUE SHIELD | Admitting: Adult Health

## 2015-10-07 DIAGNOSIS — Z760 Encounter for issue of repeat prescription: Secondary | ICD-10-CM

## 2015-10-07 DIAGNOSIS — L918 Other hypertrophic disorders of the skin: Secondary | ICD-10-CM | POA: Diagnosis not present

## 2015-10-07 MED ORDER — DIAZEPAM 2 MG PO TABS: 2.0000 mg | ORAL_TABLET | Freq: Two times a day (BID) | ORAL | Status: DC | PRN

## 2015-10-07 MED ORDER — TAPENTADOL HCL 75 MG PO TABS: 75.0000 mg | ORAL_TABLET | Freq: Two times a day (BID) | ORAL | Status: DC

## 2015-10-07 NOTE — Patient Instructions (Signed)
Rose Rose,   It was a pleasure cutting you today!   Be mindful of signs and symptoms of infection, please return if you have any.   Tylenol if needed for pain.   You are doing great with weight loss, you are down 19 pounds since August!  Have a very happy Holiday Season!

## 2015-10-07 NOTE — Progress Notes (Signed)
Subjective:    Patient ID: Rose Rose, female    DOB: [birth year], 59 y.o.   MRN: [id number]  HPI 59 year old female who presents to the office today to have five skin tags removed. 1 in her left groin, 2 under left breast and 2 in right axilla.   She would also like a medication refill for valium and Nucynta.    Review of Systems  Constitutional: Negative.   Musculoskeletal: Positive for arthralgias (knees and ankles). Negative for joint swelling, gait problem, neck pain and neck stiffness.  All other systems reviewed and are negative.  Past Medical History  Diagnosis Date  . Hyperlipidemia   . Hypertension   . Arthritis   . Depression   . Insomnia     Social History   Social History  . Marital Status: Married    Spouse Name: N/A  . Number of Children: N/A  . Years of Education: N/A   Occupational History  . Not on file.   Social History Main Topics  . Smoking status: Former Smoker    Quit date: 10/22/2002  . Smokeless tobacco: Never Used  . Alcohol Use: No  . Drug Use: No  . Sexual Activity: Not on file   Other Topics Concern  . Not on file   Social History Narrative   Works at [employer] - works with [employer].    Married for 21 years   No children        Past Surgical History  Procedure Laterality Date  . Tonsillectomy    . Myringotomy      Right ear  . Total knee arthroplasty Right 05/2011    Dr. Maureen Ralphs    Family History  Problem Relation Age of Onset  . Alzheimer's disease Maternal Aunt   . Arthritis Mother   . Vision loss Mother   . Dementia Father   . Parkinsonism Father     Allergies  Allergen Reactions  . Amoxicillin-Pot Clavulanate     REACTION: swelling    Current Outpatient Prescriptions on File Prior to Visit  Medication Sig Dispense Refill  . Ascorbic Acid (VITAMIN C) 100 MG tablet Take 100 mg by mouth daily.      . Black Cohosh (REMIFEMIN) 20 MG TABS Take by mouth 2 (two) times daily.      Marland Kitchen CINNAMON PO Take  by mouth.    . citalopram (CELEXA) 20 MG tablet Take 1 tablet (20 mg total) by mouth 2 (two) times daily. 180 tablet 0  . citalopram (CELEXA) 20 MG tablet TAKE 1 TABLET TWICE A DAY 180 tablet 0  . fluticasone (FLONASE) 50 MCG/ACT nasal spray Place 2 sprays into both nostrils daily.     . hydrochlorothiazide (HYDRODIURIL) 25 MG tablet TAKE 1/2 TO 1 TABLET (=12.5TO 25MG ) DAILY 90 tablet 0  . Multiple Vitamin (MULTIVITAMIN) capsule Take 1 capsule by mouth daily.    Marland Kitchen neomycin-polymyxin-hydrocortisone (CORTISPORIN) 3.5-10000-1 otic suspension Place 3 drops into the right ear 4 (four) times daily. 10 mL 0  . olopatadine (PATANOL) 0.1 % ophthalmic solution     . pyridoxine (B-6) 100 MG tablet Take 100 mg by mouth daily.      . simvastatin (ZOCOR) 20 MG tablet TAKE 1 TABLET DAILY 90 tablet 0  . tiZANidine (ZANAFLEX) 4 MG tablet TAKE 1 TABLET EVERY 8 HOURSAS NEEDED 270 tablet 1  . traMADol (ULTRAM) 50 MG tablet TAKE 1 TABLET EVERY 6 HOURSAS NEEDED FOR PAIN 180 tablet 3  .  valACYclovir (VALTREX) 500 MG tablet TAKE 1 TABLET DAILY AS     NEEDED 90 tablet 0   No current facility-administered medications on file prior to visit.    BP 120/80 mmHg  Temp(Src) 98.5 F (36.9 C) (Oral)  Ht 5\' 3"  (1.6 m)  Wt 195 lb (88.451 kg)  BMI 34.55 kg/m2       Objective:   Physical Exam  Constitutional: She is oriented to person, place, and time. She appears well-developed and well-nourished. No distress.  Cardiovascular: Normal rate, regular rhythm, normal heart sounds and intact distal pulses.  Exam reveals no gallop and no friction rub.   No murmur heard. Pulmonary/Chest: Effort normal and breath sounds normal. No respiratory distress. She has no wheezes. She has no rales. She exhibits no tenderness.  Neurological: She is alert and oriented to person, place, and time.  Skin: Skin is warm and dry. No rash noted. She is not diaphoretic. No erythema. No pallor.  Multiple skin tags scattered throughout body    Psychiatric: She has a normal mood and affect. Her behavior is normal. Judgment and thought content normal.  Nursing note and vitals reviewed.     Assessment & Plan:  1. Skin tag  The patient complains of symptomatic skin tags on the left groin, under left breast, and in right axilla. These are irritated by clothing, jewelry and rubbing. Patient appears well. Several benign skin tags are noted on the left groin, under left breast, in right axilla.   Consent was signed and the skin tags are snipped off using Betadine for cleansing and sterile iris scissors. Local anesthesia was  used. These pathognomonic lesions are not sent for pathology.  2. Medication refill  - diazepam (VALIUM) 2 MG tablet; Take 1 tablet (2 mg total) by mouth every 12 (twelve) hours as needed for anxiety.  Dispense: 60 tablet; Refill: 0 - Tapentadol HCl (NUCYNTA) 75 MG TABS; Take 1 tablet (75 mg total) by mouth 2 (two) times daily.  Dispense: 60 tablet; Refill: 0

## 2015-11-07 ENCOUNTER — Other Ambulatory Visit: Payer: Self-pay | Admitting: Family

## 2015-11-15 ENCOUNTER — Telehealth: Payer: Self-pay | Admitting: Adult Health

## 2015-11-15 MED ORDER — TRAMADOL HCL 50 MG PO TABS: 50.0000 mg | ORAL_TABLET | Freq: Three times a day (TID) | ORAL | Status: DC | PRN

## 2015-11-15 NOTE — Telephone Encounter (Signed)
Pls advise.  

## 2015-11-15 NOTE — Telephone Encounter (Signed)
Ok to refill. Take one pill, three times a day as needed. 90 pills, 0 refills.

## 2015-11-15 NOTE — Telephone Encounter (Signed)
Patient would like to have her Tramadol prescription refilled and sent to [facility].

## 2015-11-15 NOTE — Telephone Encounter (Signed)
Rx faxed to pharmacy  

## 2015-12-05 ENCOUNTER — Encounter: Payer: Self-pay | Admitting: Adult Health

## 2015-12-05 ENCOUNTER — Ambulatory Visit (INDEPENDENT_AMBULATORY_CARE_PROVIDER_SITE_OTHER): Payer: BLUE CROSS/BLUE SHIELD | Admitting: Adult Health

## 2015-12-05 ENCOUNTER — Telehealth: Payer: Self-pay | Admitting: Adult Health

## 2015-12-05 VITALS — BP 140/86 | Temp 98.7°F | Ht 63.0 in | Wt 186.5 lb

## 2015-12-05 DIAGNOSIS — M25532 Pain in left wrist: Secondary | ICD-10-CM | POA: Diagnosis not present

## 2015-12-05 DIAGNOSIS — Z760 Encounter for issue of repeat prescription: Secondary | ICD-10-CM

## 2015-12-05 NOTE — Patient Instructions (Signed)
As always,   It was great seeing you again.   Ice for 20 minutes at a time, Ibuprofen every 8 hours for the next 3 days and wear your brace.   Follow up with me in March for your surgical clearance.

## 2015-12-05 NOTE — Progress Notes (Signed)
Subjective:    Patient ID: Rose Rose, female    DOB: 03-12-1956, 60 y.o.   MRN: [id number]  HPI  60 year old female who presents to the office today for pain and swelling in his left wrist.She first noticed this pain about one month ago. Over the last week the pain had become worse so she started wearing a wrist brace. She endorses that she has noticed reduction in the swelling and subsequent pain.   She is unsure of any trauma to the area. She has been working out a lot recently and believes that it may be from that.   Denies any bruising to the area.     Review of Systems  Constitutional: Negative.   HENT: Negative.   Respiratory: Negative.   Gastrointestinal: Negative.   Musculoskeletal: Positive for myalgias and joint swelling.  Skin: Negative.    Past Medical History  Diagnosis Date  . Hyperlipidemia   . Hypertension   . Arthritis   . Depression   . Insomnia     Social History   Social History  . Marital Status: Married    Spouse Name: N/A  . Number of Children: N/A  . Years of Education: N/A   Occupational History  . Not on file.   Social History Main Topics  . Smoking status: Former Smoker    Quit date: 10/22/2002  . Smokeless tobacco: Never Used  . Alcohol Use: No  . Drug Use: No  . Sexual Activity: Not on file   Other Topics Concern  . Not on file   Social History Narrative   Works at [employer] - works with [employer].    Married for 21 years   No children        Past Surgical History  Procedure Laterality Date  . Tonsillectomy    . Myringotomy      Right ear  . Total knee arthroplasty Right 05/2011    Dr. Maureen Ralphs    Family History  Problem Relation Age of Onset  . Alzheimer's disease Maternal Aunt   . Arthritis Mother   . Vision loss Mother   . Dementia Father   . Parkinsonism Father     Allergies  Allergen Reactions  . Amoxicillin-Pot Clavulanate     REACTION: swelling    Current Outpatient Prescriptions on  File Prior to Visit  Medication Sig Dispense Refill  . Black Cohosh (REMIFEMIN) 20 MG TABS Take by mouth 2 (two) times daily.      . citalopram (CELEXA) 20 MG tablet TAKE 1 TABLET TWICE A DAY 180 tablet 0  . diazepam (VALIUM) 2 MG tablet Take 1 tablet (2 mg total) by mouth every 12 (twelve) hours as needed for anxiety. 60 tablet 0  . fluticasone (FLONASE) 50 MCG/ACT nasal spray Place 2 sprays into both nostrils daily.     . hydrochlorothiazide (HYDRODIURIL) 25 MG tablet TAKE 1/2 TO 1 TABLET (=12.5TO 25MG ) DAILY 90 tablet 0  . Multiple Vitamin (MULTIVITAMIN) capsule Take 1 capsule by mouth daily.    Marland Kitchen neomycin-polymyxin-hydrocortisone (CORTISPORIN) 3.5-10000-1 otic suspension Place 3 drops into the right ear 4 (four) times daily. 10 mL 0  . olopatadine (PATANOL) 0.1 % ophthalmic solution     . pyridoxine (B-6) 100 MG tablet Take 100 mg by mouth daily.      . simvastatin (ZOCOR) 20 MG tablet TAKE 1 TABLET DAILY 90 tablet 0  . Tapentadol HCl (NUCYNTA) 75 MG TABS Take 1 tablet (75 mg total) by  mouth 2 (two) times daily. 60 tablet 0  . tiZANidine (ZANAFLEX) 4 MG tablet TAKE 1 TABLET EVERY 8 HOURSAS NEEDED 270 tablet 1  . traMADol (ULTRAM) 50 MG tablet Take 1 tablet (50 mg total) by mouth every 8 (eight) hours as needed. TAKE 1 TABLET EVERY 6 HOURSAS NEEDED FOR PAIN 90 tablet 0  . valACYclovir (VALTREX) 500 MG tablet TAKE 1 TABLET DAILY AS     NEEDED 90 tablet 0   No current facility-administered medications on file prior to visit.    BP 140/86 mmHg  Temp(Src) 98.7 F (37.1 C) (Oral)  Ht 5\' 3"  (1.6 m)  Wt 186 lb 8 oz (84.596 kg)  BMI 33.05 kg/m2       Objective:   Physical Exam  Constitutional: She is oriented to person, place, and time. She appears well-developed and well-nourished. No distress.  Musculoskeletal: Normal range of motion. She exhibits edema and tenderness.       Arms: Trace swelling with no bruising to left distal area of wrist, closest to radial artery. Has normal ROM    Neurological: She is alert and oriented to person, place, and time. She has normal reflexes.  Skin: Skin is warm and dry. No rash noted. She is not diaphoretic. No erythema. No pallor.  Strong radial pulse and good cap refill.   Psychiatric: She has a normal mood and affect. Her behavior is normal. Judgment and thought content normal.  Nursing note and vitals reviewed.     Assessment & Plan:  1. Wrist pain, acute, left - Appears as edema. No hematoma.  - No masses or lumps felt.  - Ice for 20 minutes at a time throughout the day - Ibuprofen 600mg  Q 8H - Wear wrist splint.

## 2015-12-05 NOTE — Telephone Encounter (Signed)
[facility] [phone]  Requesting refill of diazepam (VALIUM) 2 MG tablet

## 2015-12-05 NOTE — Progress Notes (Signed)
Pre visit review using our clinic review tool, if applicable. No additional management support is needed unless otherwise documented below in the visit note. 

## 2015-12-06 NOTE — Telephone Encounter (Signed)
Ok to refill for one month  

## 2015-12-07 ENCOUNTER — Telehealth: Payer: Self-pay

## 2015-12-07 NOTE — Telephone Encounter (Signed)
Pt requesting diazepam (VALIUM) 2 MG tablet Pt last visit 12/05/15 Pt last refill 10/07/15 #60 no refills

## 2015-12-09 MED ORDER — DIAZEPAM 2 MG PO TABS: 2.0000 mg | ORAL_TABLET | Freq: Two times a day (BID) | ORAL | Status: DC | PRN

## 2015-12-09 NOTE — Telephone Encounter (Signed)
[facility] for 90 day supply to mail order.  Rx printed and faxed.

## 2015-12-09 NOTE — Telephone Encounter (Signed)
Rx sent to mail order

## 2015-12-21 ENCOUNTER — Other Ambulatory Visit: Payer: Self-pay | Admitting: Adult Health

## 2015-12-21 NOTE — Telephone Encounter (Signed)
OK to fill for 6 months

## 2015-12-22 ENCOUNTER — Ambulatory Visit: Payer: Self-pay | Admitting: Orthopedic Surgery

## 2015-12-22 NOTE — Progress Notes (Signed)
Preoperative surgical orders have been place into the Epic hospital system for Rose Rose on 12/22/2015, 10:23 AM  by Mickel Crow for surgery on 01-16-16.  Preop Total Knee orders including Experal, IV Tylenol, and IV Decadron as long as there are no contraindications to the above medications. Arlee Muslim, PA-C

## 2015-12-26 ENCOUNTER — Encounter: Payer: Self-pay | Admitting: Adult Health

## 2015-12-26 ENCOUNTER — Ambulatory Visit (INDEPENDENT_AMBULATORY_CARE_PROVIDER_SITE_OTHER): Payer: BLUE CROSS/BLUE SHIELD | Admitting: Adult Health

## 2015-12-26 VITALS — BP 126/84 | Temp 98.8°F | Ht 63.0 in | Wt 185.0 lb

## 2015-12-26 DIAGNOSIS — Z01818 Encounter for other preprocedural examination: Secondary | ICD-10-CM

## 2015-12-26 NOTE — Progress Notes (Signed)
Subjective:    Patient ID: Rose Rose, female    DOB: 03-Mar-1956, 60 y.o.   MRN: [id number]  HPI  60 year old female who presents to the office today for surgical clearance related to Left  knee surgery.  She is scheduled for surgery on March 27th, 2017. She plans on being out of work for two months. She has no acute complaints today   Today in the office her blood pressure is controlled. She is reporting no shortness of breath or chest pain.   Review of Systems  Constitutional: Negative.   HENT: Negative.   Respiratory: Negative.   Cardiovascular: Negative.   Musculoskeletal: Positive for arthralgias. Negative for back pain, joint swelling and gait problem.  Hematological: Negative.   Psychiatric/Behavioral: Negative.   All other systems reviewed and are negative.  Past Medical History  Diagnosis Date  . Hyperlipidemia   . Hypertension   . Arthritis   . Depression   . Insomnia     Social History   Social History  . Marital Status: Married    Spouse Name: N/A  . Number of Children: N/A  . Years of Education: N/A   Occupational History  . Not on file.   Social History Main Topics  . Smoking status: Former Smoker    Quit date: 10/22/2002  . Smokeless tobacco: Never Used  . Alcohol Use: No  . Drug Use: No  . Sexual Activity: Not on file   Other Topics Concern  . Not on file   Social History Narrative   Works at [employer] - works with [employer].    Married for 21 years   No children        Past Surgical History  Procedure Laterality Date  . Tonsillectomy    . Myringotomy      Right ear  . Total knee arthroplasty Right 05/2011    Dr. Maureen Ralphs    Family History  Problem Relation Age of Onset  . Alzheimer's disease Maternal Aunt   . Arthritis Mother   . Vision loss Mother   . Dementia Father   . Parkinsonism Father     Allergies  Allergen Reactions  . Amoxicillin-Pot Clavulanate     REACTION: swelling  . Oxycodone Hcl Nausea And  Vomiting    Current Outpatient Prescriptions on File Prior to Visit  Medication Sig Dispense Refill  . Black Cohosh (REMIFEMIN) 20 MG TABS Take by mouth 2 (two) times daily.      . citalopram (CELEXA) 20 MG tablet TAKE 1 TABLET TWICE A DAY 180 tablet 1  . diazepam (VALIUM) 2 MG tablet Take 1 tablet (2 mg total) by mouth every 12 (twelve) hours as needed for anxiety. 180 tablet 0  . fluticasone (FLONASE) 50 MCG/ACT nasal spray Place 2 sprays into both nostrils daily.     . hydrochlorothiazide (HYDRODIURIL) 25 MG tablet TAKE 1/2 TO 1 TABLET (=12.5TO 25MG ) DAILY 90 tablet 0  . Multiple Vitamin (MULTIVITAMIN) capsule Take 1 capsule by mouth daily.    Marland Kitchen neomycin-polymyxin-hydrocortisone (CORTISPORIN) 3.5-10000-1 otic suspension Place 3 drops into the right ear 4 (four) times daily. 10 mL 0  . olopatadine (PATANOL) 0.1 % ophthalmic solution     . simvastatin (ZOCOR) 20 MG tablet TAKE 1 TABLET DAILY 90 tablet 0  . Tapentadol HCl (NUCYNTA) 75 MG TABS Take 1 tablet (75 mg total) by mouth 2 (two) times daily. 60 tablet 0  . tiZANidine (ZANAFLEX) 4 MG tablet TAKE 1 TABLET EVERY  8 HOURSAS NEEDED 270 tablet 1  . traMADol (ULTRAM) 50 MG tablet Take 1 tablet (50 mg total) by mouth every 8 (eight) hours as needed. TAKE 1 TABLET EVERY 6 HOURSAS NEEDED FOR PAIN 90 tablet 0  . valACYclovir (VALTREX) 500 MG tablet TAKE 1 TABLET DAILY AS     NEEDED 90 tablet 0   No current facility-administered medications on file prior to visit.    BP 126/84 mmHg  Temp(Src) 98.8 F (37.1 C) (Oral)  Ht 5\' 3"  (1.6 m)  Wt 185 lb (83.915 kg)  BMI 32.78 kg/m2       Objective:   Physical Exam  Constitutional: She is oriented to person, place, and time. She appears well-developed and well-nourished. No distress.  HENT:  Head: Normocephalic and atraumatic.  Right Ear: External ear normal.  Left Ear: External ear normal.  Nose: Nose normal.  Mouth/Throat: Oropharynx is clear and moist. No oropharyngeal exudate.  Eyes:  Conjunctivae and EOM are normal. Pupils are equal, round, and reactive to light. Right eye exhibits no discharge. Left eye exhibits no discharge.  Neck: Normal range of motion. Neck supple.  Cardiovascular: Normal rate, regular rhythm, normal heart sounds and intact distal pulses.  Exam reveals no gallop and no friction rub.   No murmur heard. Pulmonary/Chest: Effort normal and breath sounds normal. No respiratory distress. She has no wheezes. She has no rales. She exhibits no tenderness.  Musculoskeletal: Normal range of motion. She exhibits no edema or tenderness.  Lymphadenopathy:    She has no cervical adenopathy.  Neurological: She is alert and oriented to person, place, and time. She has normal reflexes.  Skin: Skin is warm and dry. No rash noted. She is not diaphoretic. No erythema. No pallor.  Psychiatric: She has a normal mood and affect. Her behavior is normal. Judgment and thought content normal.  Nursing note and vitals reviewed.     Assessment & Plan:  1. Preoperative clearance - EKG 12-Lead- Sinus  Rhythm  -Nonspecific QRS widening, Rate 65 - Will fax pre op paper work,  - Follow up as needed

## 2015-12-26 NOTE — Patient Instructions (Signed)
It was great seeing you again!  We will get your paperwork signed for surgical clearance.  I wish you the best of luck and a speedy recovery. Please let us know if you need anything.

## 2015-12-26 NOTE — Progress Notes (Signed)
Pre visit review using our clinic review tool, if applicable. No additional management support is needed unless otherwise documented below in the visit note. 

## 2016-01-06 ENCOUNTER — Encounter (HOSPITAL_COMMUNITY)
Admission: RE | Admit: 2016-01-06 | Discharge: 2016-01-06 | Disposition: A | Discharge status: Home / Self Care | Payer: BLUE CROSS/BLUE SHIELD | Source: Ambulatory Visit | Attending: Orthopedic Surgery | Admitting: Orthopedic Surgery

## 2016-01-06 ENCOUNTER — Encounter (HOSPITAL_COMMUNITY): Payer: Self-pay

## 2016-01-06 DIAGNOSIS — M1712 Unilateral primary osteoarthritis, left knee: Secondary | ICD-10-CM | POA: Insufficient documentation

## 2016-01-06 DIAGNOSIS — Z01812 Encounter for preprocedural laboratory examination: Secondary | ICD-10-CM | POA: Diagnosis not present

## 2016-01-06 DIAGNOSIS — Z0183 Encounter for blood typing: Secondary | ICD-10-CM | POA: Diagnosis not present

## 2016-01-06 HISTORY — DX: Adverse effect of unspecified anesthetic, initial encounter: T41.45XA

## 2016-01-06 HISTORY — DX: Other complications of anesthesia, initial encounter: T88.59XA

## 2016-01-06 HISTORY — DX: Other specified postprocedural states: R11.2

## 2016-01-06 HISTORY — DX: Pneumonia, unspecified organism: J18.9

## 2016-01-06 HISTORY — DX: Nausea with vomiting, unspecified: Z98.890

## 2016-01-06 LAB — COMPREHENSIVE METABOLIC PANEL
ALT: 21 U/L (ref 14–54)
AST: 25 U/L (ref 15–41)
Albumin: 4.3 g/dL (ref 3.5–5.0)
Alkaline Phosphatase: 82 U/L (ref 38–126)
Anion gap: 10 (ref 5–15)
BILIRUBIN TOTAL: 0.4 mg/dL (ref 0.3–1.2)
BUN: 13 mg/dL (ref 6–20)
CALCIUM: 9.5 mg/dL (ref 8.9–10.3)
CO2: 26 mmol/L (ref 22–32)
CREATININE: 0.63 mg/dL (ref 0.44–1.00)
Chloride: 102 mmol/L (ref 101–111)
Glucose, Bld: 104 mg/dL — ABNORMAL HIGH (ref 65–99)
POTASSIUM: 3.6 mmol/L (ref 3.5–5.1)
Sodium: 138 mmol/L (ref 135–145)
TOTAL PROTEIN: 7.3 g/dL (ref 6.5–8.1)

## 2016-01-06 LAB — URINALYSIS, ROUTINE W REFLEX MICROSCOPIC
Bilirubin Urine: NEGATIVE
Glucose, UA: NEGATIVE mg/dL
Ketones, ur: NEGATIVE mg/dL
NITRITE: NEGATIVE
PH: 7 (ref 5.0–8.0)
Protein, ur: NEGATIVE mg/dL
SPECIFIC GRAVITY, URINE: 1.008 (ref 1.005–1.030)

## 2016-01-06 LAB — SURGICAL PCR SCREEN
MRSA, PCR: POSITIVE — AB
Staphylococcus aureus: POSITIVE — AB

## 2016-01-06 LAB — CBC
HEMATOCRIT: 42.2 % (ref 36.0–46.0)
Hemoglobin: 14.3 g/dL (ref 12.0–15.0)
MCH: 28.1 pg (ref 26.0–34.0)
MCHC: 33.9 g/dL (ref 30.0–36.0)
MCV: 83.1 fL (ref 78.0–100.0)
Platelets: 273 10*3/uL (ref 150–400)
RBC: 5.08 MIL/uL (ref 3.87–5.11)
RDW: 14.5 % (ref 11.5–15.5)
WBC: 10.3 10*3/uL (ref 4.0–10.5)

## 2016-01-06 LAB — URINE MICROSCOPIC-ADD ON

## 2016-01-06 LAB — APTT: aPTT: 32 seconds (ref 24–37)

## 2016-01-06 LAB — PROTIME-INR
INR: 1.01 (ref 0.00–1.49)
PROTHROMBIN TIME: 13.5 s (ref 11.6–15.2)

## 2016-01-06 NOTE — Pre-Procedure Instructions (Signed)
EKG 12-26-15 epic Medical Clearance on chart and 12-26-15 epic

## 2016-01-06 NOTE — Patient Instructions (Addendum)
Rose Rose  01/06/2016   Your procedure is scheduled on: 01/16/16  Report to [facility]  Entrance take [facility]  elevators to 3rd floor to  [place] at 5:30 AM.  Call this number if you have problems the morning of surgery [phone]   Remember: ONLY 1 PERSON MAY GO WITH YOU TO SHORT STAY TO GET  READY MORNING OF [place].  Do not eat food or drink liquids :After Midnight.     Take these medicines the morning of surgery with A SIP OF WATER: Celexa, flonase if needed, eye drops if needed. DO NOT TAKE ANY DIABETIC MEDICATIONS DAY OF YOUR SURGERY                               You may not have any metal on your body including hair pins and              piercings  Do not wear jewelry, make-up, lotions, powders or perfumes, deodorant             Do not wear nail polish.  Do not shave  48 hours prior to surgery.              Men may shave face and neck.   Do not bring valuables to the hospital. [place].  Contacts, dentures or bridgework may not be worn into surgery.  Leave suitcase in the car. After surgery it may be brought to your room.   Special Instructions: N/A              Please read over the following fact sheets you were given: _____________________________________________________________________             [facility] - Preparing for Surgery Before surgery, you can play an important role.  Because skin is not sterile, your skin needs to be as free of germs as possible.  You can reduce the number of germs on your skin by washing with CHG (chlorahexidine gluconate) soap before surgery.  CHG is an antiseptic cleaner which kills germs and bonds with the skin to continue killing germs even after washing. Please DO NOT use if you have an allergy to CHG or antibacterial soaps.  If your skin becomes reddened/irritated stop using the CHG and inform your nurse when you arrive at Short Stay. Do not  shave (including legs and underarms) for at least 48 hours prior to the first CHG shower.  You may shave your face/neck. Please follow these instructions carefully:  1.  Shower with CHG Soap the night before surgery and the  morning of Surgery.  2.  If you choose to wash your hair, wash your hair first as usual with your  normal  shampoo.  3.  After you shampoo, rinse your hair and body thoroughly to remove the  shampoo.                           4.  Use CHG as you would any other liquid soap.  You can apply chg directly  to the skin and wash  Gently with a scrungie or clean washcloth.  5.  Apply the CHG Soap to your body ONLY FROM THE NECK DOWN.   Do not use on face/ open                           Wound or open sores. Avoid contact with eyes, ears mouth and genitals (private parts).                       Wash face,  Genitals (private parts) with your normal soap.             6.  Wash thoroughly, paying special attention to the area where your surgery  will be performed.  7.  Thoroughly rinse your body with warm water from the neck down.  8.  DO NOT shower/wash with your normal soap after using and rinsing off  the CHG Soap.                9.  Pat yourself dry with a clean towel.            10.  Wear clean pajamas.            11.  Place clean sheets on your bed the night of your first shower and do not  sleep with pets. Day of Surgery : Do not apply any lotions/deodorants the morning of surgery.  Please wear clean clothes to the hospital/surgery center.  FAILURE TO FOLLOW THESE INSTRUCTIONS MAY RESULT IN THE CANCELLATION OF YOUR SURGERY PATIENT SIGNATURE_________________________________  NURSE SIGNATURE__________________________________  ________________________________________________________________________   Adam Phenix  An incentive spirometer is a tool that can help keep your lungs clear and active. This tool measures how well you are filling your lungs  with each breath. Taking long deep breaths may help reverse or decrease the chance of developing breathing (pulmonary) problems (especially infection) following:  A long period of time when you are unable to move or be active. BEFORE THE PROCEDURE   If the spirometer includes an indicator to show your best effort, your nurse or respiratory therapist will set it to a desired goal.  If possible, sit up straight or lean slightly forward. Try not to slouch.  Hold the incentive spirometer in an upright position. INSTRUCTIONS FOR USE   Sit on the edge of your bed if possible, or sit up as far as you can in bed or on a chair.  Hold the incentive spirometer in an upright position.  Breathe out normally.  Place the mouthpiece in your mouth and seal your lips tightly around it.  Breathe in slowly and as deeply as possible, raising the piston or the ball toward the top of the column.  Hold your breath for 3-5 seconds or for as long as possible. Allow the piston or ball to fall to the bottom of the column.  Remove the mouthpiece from your mouth and breathe out normally.  Rest for a few seconds and repeat Steps 1 through 7 at least 10 times every 1-2 hours when you are awake. Take your time and take a few normal breaths between deep breaths.  The spirometer may include an indicator to show your best effort. Use the indicator as a goal to work toward during each repetition.  After each set of 10 deep breaths, practice coughing to be sure your lungs are clear. If you have an incision (the cut made at the time of surgery),  support your incision when coughing by placing a pillow or rolled up towels firmly against it. Once you are able to get out of bed, walk around indoors and cough well. You may stop using the incentive spirometer when instructed by your caregiver.  RISKS AND COMPLICATIONS  Take your time so you do not get dizzy or light-headed.  If you are in pain, you may need to take or ask  for pain medication before doing incentive spirometry. It is harder to take a deep breath if you are having pain. AFTER USE  Rest and breathe slowly and easily.  It can be helpful to keep track of a log of your progress. Your caregiver can provide you with a simple table to help with this. If you are using the spirometer at home, follow these instructions: [place] IF:   You are having difficultly using the spirometer.  You have trouble using the spirometer as often as instructed.  Your pain medication is not giving enough relief while using the spirometer.  You develop fever of 100.5 F (38.1 C) or higher. SEEK IMMEDIATE MEDICAL CARE IF:   You cough up bloody sputum that had not been present before.  You develop fever of 102 F (38.9 C) or greater.  You develop worsening pain at or near the incision site. MAKE SURE YOU:   Understand these instructions.  Will watch your condition.  Will get help right away if you are not doing well or get worse. Document Released: 02/18/2007 Document Revised: 12/31/2011 Document Reviewed: 04/21/2007 ExitCare Patient Information 2014 Exit[employer], [place].   ________________________________________________________________________  WHAT IS A BLOOD TRANSFUSION? Blood Transfusion Information  A transfusion is the replacement of blood or some of its parts. Blood is made up of multiple cells which provide different functions.  Red blood cells carry oxygen and are used for blood loss replacement.  White blood cells fight against infection.  Platelets control bleeding.  Plasma helps clot blood.  Other blood products are available for specialized needs, such as hemophilia or other clotting disorders. BEFORE THE TRANSFUSION  Who gives blood for transfusions?   Healthy volunteers who are fully evaluated to make sure their blood is safe. This is blood bank blood. Transfusion therapy is the safest it has ever been in the practice of  medicine. Before blood is taken from a donor, a complete history is taken to make sure that person has no history of diseases nor engages in risky social behavior (examples are intravenous drug use or sexual activity with multiple partners). The donor's travel history is screened to minimize risk of transmitting infections, such as malaria. The donated blood is tested for signs of infectious diseases, such as HIV and hepatitis. The blood is then tested to be sure it is compatible with you in order to minimize the chance of a transfusion reaction. If you or a relative donates blood, this is often done in anticipation of surgery and is not appropriate for emergency situations. It takes many days to process the donated blood. RISKS AND COMPLICATIONS Although transfusion therapy is very safe and saves many lives, the main dangers of transfusion include:   Getting an infectious disease.  Developing a transfusion reaction. This is an allergic reaction to something in the blood you were given. Every precaution is taken to prevent this. The decision to have a blood transfusion has been considered carefully by your caregiver before blood is given. Blood is not given unless the benefits outweigh the risks. AFTER THE TRANSFUSION  Right after receiving a blood transfusion, you will usually feel much better and more energetic. This is especially true if your red blood cells have gotten low (anemic). The transfusion raises the level of the red blood cells which carry oxygen, and this usually causes an energy increase.  The nurse administering the transfusion will monitor you carefully for complications. HOME CARE INSTRUCTIONS  No special instructions are needed after a transfusion. You may find your energy is better. Speak with your caregiver about any limitations on activity for underlying diseases you may have. SEEK MEDICAL CARE IF:   Your condition is not improving after your transfusion.  You develop  redness or irritation at the intravenous (IV) site. SEEK IMMEDIATE MEDICAL CARE IF:  Any of the following symptoms occur over the next 12 hours:  Shaking chills.  You have a temperature by mouth above 102 F (38.9 C), not controlled by medicine.  Chest, back, or muscle pain.  People around you feel you are not acting correctly or are confused.  Shortness of breath or difficulty breathing.  Dizziness and fainting.  You get a rash or develop hives.  You have a decrease in urine output.  Your urine turns a dark color or changes to pink, red, or brown. Any of the following symptoms occur over the next 10 days:  You have a temperature by mouth above 102 F (38.9 C), not controlled by medicine.  Shortness of breath.  Weakness after normal activity.  The white part of the eye turns yellow (jaundice).  You have a decrease in the amount of urine or are urinating less often.  Your urine turns a dark color or changes to pink, red, or brown. Document Released: 10/05/2000 Document Revised: 12/31/2011 Document Reviewed: 05/24/2008 [facility] Patient Information 2014 [place], [place].  _______________________________________________________________________

## 2016-01-09 ENCOUNTER — Other Ambulatory Visit: Payer: Self-pay

## 2016-01-09 ENCOUNTER — Telehealth: Payer: Self-pay | Admitting: Adult Health

## 2016-01-09 NOTE — Telephone Encounter (Signed)
Ok to refill 

## 2016-01-09 NOTE — Telephone Encounter (Signed)
Pt needs new rx tramadol 50 mg #30 only send to [facility]. Pt also needs refill tramadol 50 mg #180 send to [facility]. Pt is out

## 2016-01-09 NOTE — Progress Notes (Signed)
Surgical screening results in epic per PAT visit 01/06/2016 positive for MRSA and STAPH. Results sent to Dr Wynelle Link. Spoke with Joe/pharmacist with [facility] [address] in regards to [place] prescription. Pt aware.

## 2016-01-09 NOTE — Telephone Encounter (Signed)
Rx called in to [facility] on [address] for #30 tablets, and Rx called in to [facility] for #180 tablets.

## 2016-01-12 NOTE — H&P (Signed)
TOTAL KNEE ADMISSION H&P  Patient is being admitted for left total knee arthroplasty.  Subjective:  Chief Complaint:left knee pain.  HPI: Rose Rose, 60 y.o. female, has a history of pain and functional disability in the left knee due to arthritis and has failed non-surgical conservative treatments for greater than 12 weeks to includeNSAID's and/or analgesics, corticosteriod injections, flexibility and strengthening excercises and activity modification.  Onset of symptoms was gradual, starting 5 years ago with gradually worsening course since that time. The patient noted no past surgery on the left knee(s).  Patient currently rates pain in the left knee(s) at 6 out of 10 with activity. Patient has night pain, worsening of pain with activity and weight bearing, pain that interferes with activities of daily living, pain with passive range of motion, crepitus and joint swelling.  Patient has evidence of periarticular osteophytes and joint space narrowing by imaging studies. There is no active infection.  Patient Active Problem List   Diagnosis Date Noted  . Ankle pain, chronic 03/26/2012  . Obesity (BMI 30-39.9) 03/26/2012  . Adjustment disorder with anxious mood 01/10/2011  . Knee instability 01/10/2011  . LOC OSTEOARTHROS NOT SPEC PRIM/SEC LOWER LEG 11/15/2009  . KNEE PAIN, LEFT 12/15/2008  . OSTEOARTHRITIS 01/16/2008  . HERPES SIMPLEX, UNCOMPLICATED 0000000  . HYPERLIPIDEMIA 05/12/2007  . HYPERTENSION 05/12/2007  . P V D W/CLAUDICATION 05/12/2007   Past Medical History  Diagnosis Date  . Hyperlipidemia   . Hypertension   . Arthritis   . Depression   . Insomnia   . Complication of anesthesia   . PONV (postoperative nausea and vomiting)   . Pneumonia     Past Surgical History  Procedure Laterality Date  . Tonsillectomy    . Myringotomy      Right ear  . Total knee arthroplasty Right 05/2011    Dr. Maureen Ralphs      Current outpatient prescriptions:  .  citalopram (CELEXA) 20  MG tablet, TAKE 1 TABLET TWICE A DAY, Disp: 180 tablet, Rfl: 1 .  diazepam (VALIUM) 2 MG tablet, Take 1 tablet (2 mg total) by mouth every 12 (twelve) hours as needed for anxiety., Disp: 180 tablet, Rfl: 0 .  fluticasone (FLONASE) 50 MCG/ACT nasal spray, Place 2 sprays into both nostrils daily as needed for allergies or rhinitis. , Disp: , Rfl:  .  hydrochlorothiazide (HYDRODIURIL) 25 MG tablet, TAKE 1/2 tablet (12.5mg ) once daily), Disp: 90 tablet, Rfl: 0 .  Multiple Vitamin (MULTIVITAMIN) capsule, Take 1 capsule by mouth daily., Disp: , Rfl:  .  olopatadine (PATANOL) 0.1 % ophthalmic solution, Place 1 drop into both eyes daily as needed for allergies. , Disp: , Rfl:  .  OVER THE COUNTER MEDICATION, Take 1 tablet by mouth 2 (two) times daily. remifemin, Disp: , Rfl:  .  OVER THE COUNTER MEDICATION, Take 1 tablet by mouth 2 (two) times daily. Garcinia, Disp: , Rfl:  .  simvastatin (ZOCOR) 20 MG tablet, TAKE one half TABLET (10 mg) daily at bedtime), Disp: 90 tablet, Rfl: 0 .  Tapentadol HCl (NUCYNTA) Take 75 mg by mouth 2 (two) times daily as needed. ), Disp: 60 tablet, Rfl: 0 .  tiZANidine (ZANAFLEX) 4 MG tablet, Take 4 mg by mouth 2 (two) times daily as needed for muscle spasms. ), Disp: 270 tablet, Rfl: 1 .  traMADol (ULTRAM) 50 MG tablet,  Take 50 mg by mouth 2 (two) times daily. ), Disp: 90 tablet, Rfl: 0 .  valACYclovir (VALTREX) 500 MG tablet,TAKE 1 TABLET  DAILY AS  NEEDED for fever blisters), Disp: 90 tablet, Rfl: 0  Allergies  Allergen Reactions  . Amoxicillin-Pot Clavulanate     REACTION: swelling  Has patient had a PCN reaction causing immediate rash, facial/tongue/throat swelling, SOB or lightheadedness with hypotension: unk Has patient had a PCN reaction causing severe rash involving mucus membranes or skin necrosis: no Has patient had a PCN reaction that required hospitalization: unk Has patient had a PCN reaction occurring within the last 10 years: yes If all of the above  answers are "NO", then may proceed with Cephalosporin use.   . Augmentin [Amoxicillin-Pot Clavulanate] Itching and Swelling  . Oxycodone Hcl Nausea And Vomiting    Social History  Substance Use Topics  . Smoking status: Former Smoker    Quit date: 10/22/2002  . Smokeless tobacco: Never Used  . Alcohol Use: No    Family History  Problem Relation Age of Onset  . Alzheimer's disease Maternal Aunt   . Arthritis Mother   . Vision loss Mother   . Dementia Father   . Parkinsonism Father      Review of Systems  Constitutional: Negative.   HENT: Positive for tinnitus. Negative for congestion, ear discharge, ear pain, hearing loss, nosebleeds and sore throat.   Eyes: Negative.   Respiratory: Negative.  Negative for stridor.   Cardiovascular: Negative.   Gastrointestinal: Negative.   Genitourinary: Negative.   Musculoskeletal: Positive for myalgias and joint pain. Negative for back pain, falls and neck pain.  Skin: Negative.   Neurological: Positive for dizziness. Negative for tingling, tremors, sensory change, speech change, focal weakness, seizures, loss of consciousness and headaches.  Endo/Heme/Allergies: Positive for environmental allergies. Negative for polydipsia. Does not bruise/bleed easily.  Psychiatric/Behavioral: Negative.     Objective:  Physical Exam  Constitutional: She is oriented to person, place, and time. She appears well-developed. No distress.  Overweight  HENT:  Head: Normocephalic and atraumatic.  Right Ear: External ear normal.  Left Ear: External ear normal.  Nose: Nose normal.  Mouth/Throat: Oropharynx is clear and moist.  Eyes: Conjunctivae and EOM are normal.  Neck: Normal range of motion. Neck supple.  Cardiovascular: Normal rate, regular rhythm, normal heart sounds and intact distal pulses.   No murmur heard. Respiratory: Effort normal and breath sounds normal. No respiratory distress. She has no wheezes.  GI: Soft. Bowel sounds are normal. She  exhibits no distension. There is no tenderness.  Musculoskeletal:       Right hip: Normal.       Left hip: Normal.       Right knee: Normal.  Her left knee shows no effusion. She has got moderate crepitus on range of motion of the knee. She is tender, medial greater than lateral. Range about 5 to 130. There is no instability noted. Incision over right total knee healed with no signs of infection.  Neurological: She is alert and oriented to person, place, and time. She has normal strength and normal reflexes. No sensory deficit.  Skin: No rash noted. She is not diaphoretic. No erythema.  Psychiatric: She has a normal mood and affect. Her behavior is normal.    Vitals  Weight: 175 lb Height: 62.5in Body Surface Area: 1.82 m Body Mass Index: 31.5 kg/m  Pulse: 80 (Regular)  BP: 116/74 (Sitting, Left Arm, Standard)  Imaging Review Plain radiographs demonstrate severe degenerative joint disease of the left knee(s). The overall alignment ismild varus. The bone quality appears to be good for age and reported activity level.  Assessment/Plan:  End stage primary osteoarthritis, left knee   The patient history, physical examination, clinical judgment of the provider and imaging studies are consistent with end stage degenerative joint disease of the left knee(s) and total knee arthroplasty is deemed medically necessary. The treatment options including medical management, injection therapy arthroscopy and arthroplasty were discussed at length. The risks and benefits of total knee arthroplasty were presented and reviewed. The risks due to aseptic loosening, infection, stiffness, patella tracking problems, thromboembolic complications and other imponderables were discussed. The patient acknowledged the explanation, agreed to proceed with the plan and consent was signed. Patient is being admitted for inpatient treatment for surgery, pain control, PT, OT, prophylactic antibiotics, VTE prophylaxis,  progressive ambulation and ADL's and discharge planning. The patient is planning to be discharged home with home health services    PCP: Dr. Tia Masker, PA-C

## 2016-01-15 NOTE — Anesthesia Preprocedure Evaluation (Addendum)
Anesthesia Evaluation  Patient identified by MRN, date of birth, ID band Patient awake    Reviewed: Allergy & Precautions, NPO status , Patient's Chart, lab work & pertinent test results  Airway Mallampati: II  TM Distance: >3 FB Neck ROM: Full    Dental   Pulmonary former smoker,    breath sounds clear to auscultation       Cardiovascular hypertension, Pt. on medications  Rhythm:Regular Rate:Normal     Neuro/Psych Depression negative neurological ROS     GI/Hepatic negative GI ROS, Neg liver ROS,   Endo/Other  negative endocrine ROS  Renal/GU negative Renal ROS     Musculoskeletal  (+) Arthritis ,   Abdominal   Peds  Hematology negative hematology ROS (+)   Anesthesia Other Findings   Reproductive/Obstetrics                            Lab Results  Component Value Date   WBC 10.3 01/06/2016   HGB 14.3 01/06/2016   HCT 42.2 01/06/2016   MCV 83.1 01/06/2016   PLT 273 01/06/2016   Lab Results  Component Value Date   CREATININE 0.63 01/06/2016   BUN 13 01/06/2016   NA 138 01/06/2016   K 3.6 01/06/2016   CL 102 01/06/2016   CO2 26 01/06/2016   Lab Results  Component Value Date   INR 1.01 01/06/2016   INR 0.97 06/08/2011    Anesthesia Physical Anesthesia Plan  ASA: II  Anesthesia Plan: Spinal   Post-op Pain Management:    Induction: Intravenous  Airway Management Planned: Natural Airway and Simple Face Mask  Additional Equipment:   Intra-op Plan:   Post-operative Plan:   Informed Consent: I have reviewed the patients History and Physical, chart, labs and discussed the procedure including the risks, benefits and alternatives for the proposed anesthesia with the patient or authorized representative who has indicated his/her understanding and acceptance.     Plan Discussed with: CRNA  Anesthesia Plan Comments:        Anesthesia Quick Evaluation

## 2016-01-16 ENCOUNTER — Inpatient Hospital Stay (HOSPITAL_COMMUNITY): Payer: BLUE CROSS/BLUE SHIELD | Admitting: Anesthesiology

## 2016-01-16 ENCOUNTER — Encounter (HOSPITAL_COMMUNITY): Payer: Self-pay | Admitting: *Deleted

## 2016-01-16 ENCOUNTER — Encounter (HOSPITAL_COMMUNITY)
Admission: RE | Disposition: A | Discharge status: Home Health | Payer: Self-pay | Source: Ambulatory Visit | Attending: Orthopedic Surgery

## 2016-01-16 ENCOUNTER — Inpatient Hospital Stay (HOSPITAL_COMMUNITY)
Admission: RE | Admit: 2016-01-16 | Discharge: 2016-01-17 | DRG: 470 | Disposition: A | Discharge status: Home Health | Payer: BLUE CROSS/BLUE SHIELD | Source: Ambulatory Visit | Attending: Orthopedic Surgery | Admitting: Orthopedic Surgery

## 2016-01-16 DIAGNOSIS — M1712 Unilateral primary osteoarthritis, left knee: Secondary | ICD-10-CM | POA: Diagnosis present

## 2016-01-16 DIAGNOSIS — Z6834 Body mass index (BMI) 34.0-34.9, adult: Secondary | ICD-10-CM | POA: Diagnosis not present

## 2016-01-16 DIAGNOSIS — Z79899 Other long term (current) drug therapy: Secondary | ICD-10-CM

## 2016-01-16 DIAGNOSIS — Z87891 Personal history of nicotine dependence: Secondary | ICD-10-CM

## 2016-01-16 DIAGNOSIS — M25562 Pain in left knee: Secondary | ICD-10-CM | POA: Diagnosis present

## 2016-01-16 DIAGNOSIS — E785 Hyperlipidemia, unspecified: Secondary | ICD-10-CM | POA: Diagnosis present

## 2016-01-16 DIAGNOSIS — E669 Obesity, unspecified: Secondary | ICD-10-CM | POA: Diagnosis present

## 2016-01-16 DIAGNOSIS — I10 Essential (primary) hypertension: Secondary | ICD-10-CM | POA: Diagnosis present

## 2016-01-16 DIAGNOSIS — Z01812 Encounter for preprocedural laboratory examination: Secondary | ICD-10-CM | POA: Diagnosis not present

## 2016-01-16 DIAGNOSIS — M179 Osteoarthritis of knee, unspecified: Secondary | ICD-10-CM | POA: Diagnosis present

## 2016-01-16 DIAGNOSIS — M1710 Unilateral primary osteoarthritis, unspecified knee: Secondary | ICD-10-CM | POA: Diagnosis present

## 2016-01-16 HISTORY — PX: TOTAL KNEE ARTHROPLASTY: SHX125

## 2016-01-16 LAB — TYPE AND SCREEN
ABO/RH(D): O POS
ANTIBODY SCREEN: NEGATIVE

## 2016-01-16 SURGERY — ARTHROPLASTY, KNEE, TOTAL
Anesthesia: Spinal | Site: Knee | Laterality: Left

## 2016-01-16 MED ORDER — BUPIVACAINE LIPOSOME 1.3 % IJ SUSP
INTRAMUSCULAR | Status: DC | PRN
  Administered 2016-01-16: 20 mL

## 2016-01-16 MED ORDER — BISACODYL 10 MG RE SUPP
10.0000 mg | Freq: Every day | RECTAL | Status: DC | PRN
Start: 2016-01-16 — End: 2016-01-17

## 2016-01-16 MED ORDER — ACETAMINOPHEN 10 MG/ML IV SOLN
INTRAVENOUS | Status: AC
  Filled 2016-01-16: qty 100

## 2016-01-16 MED ORDER — METHOCARBAMOL 1000 MG/10ML IJ SOLN
500.0000 mg | Freq: Four times a day (QID) | INTRAVENOUS | Status: DC | PRN
  Administered 2016-01-16: 500 mg via INTRAVENOUS
  Filled 2016-01-16 (×2): qty 5

## 2016-01-16 MED ORDER — OLOPATADINE HCL 0.1 % OP SOLN: 1.0000 [drp] | Freq: Every day | OPHTHALMIC | Status: DC | PRN

## 2016-01-16 MED ORDER — SODIUM CHLORIDE 0.9 % IJ SOLN
INTRAMUSCULAR | Status: AC
  Filled 2016-01-16: qty 10

## 2016-01-16 MED ORDER — FLUTICASONE PROPIONATE 50 MCG/ACT NA SUSP: 2.0000 | Freq: Every day | NASAL | Status: DC | PRN

## 2016-01-16 MED ORDER — ACETAMINOPHEN 10 MG/ML IV SOLN
1000.0000 mg | Freq: Once | INTRAVENOUS | Status: AC
  Administered 2016-01-16: 1000 mg via INTRAVENOUS

## 2016-01-16 MED ORDER — ACETAMINOPHEN 500 MG PO TABS
1000.0000 mg | ORAL_TABLET | Freq: Four times a day (QID) | ORAL | Status: AC
  Administered 2016-01-16 – 2016-01-17 (×4): 1000 mg via ORAL
  Filled 2016-01-16 (×4): qty 2

## 2016-01-16 MED ORDER — BUPIVACAINE HCL (PF) 0.25 % IJ SOLN
INTRAMUSCULAR | Status: AC
  Filled 2016-01-16: qty 30

## 2016-01-16 MED ORDER — VANCOMYCIN HCL IN DEXTROSE 1-5 GM/200ML-% IV SOLN
1000.0000 mg | INTRAVENOUS | Status: AC
  Administered 2016-01-16: 1000 mg via INTRAVENOUS
  Filled 2016-01-16: qty 200

## 2016-01-16 MED ORDER — LIDOCAINE HCL (CARDIAC) 20 MG/ML IV SOLN
INTRAVENOUS | Status: AC
  Filled 2016-01-16: qty 5

## 2016-01-16 MED ORDER — HYDROMORPHONE HCL 1 MG/ML IJ SOLN
INTRAMUSCULAR | Status: AC
  Administered 2016-01-17: 0.5 mg via INTRAVENOUS
  Filled 2016-01-16: qty 1

## 2016-01-16 MED ORDER — FENTANYL CITRATE (PF) 100 MCG/2ML IJ SOLN
INTRAMUSCULAR | Status: AC
  Filled 2016-01-16: qty 2

## 2016-01-16 MED ORDER — METOCLOPRAMIDE HCL 5 MG/ML IJ SOLN: 5.0000 mg | Freq: Three times a day (TID) | INTRAMUSCULAR | Status: DC | PRN

## 2016-01-16 MED ORDER — TRANEXAMIC ACID 1000 MG/10ML IV SOLN
1000.0000 mg | Freq: Once | INTRAVENOUS | Status: AC
  Administered 2016-01-16: 1000 mg via INTRAVENOUS
  Filled 2016-01-16: qty 10

## 2016-01-16 MED ORDER — ONDANSETRON HCL 4 MG/2ML IJ SOLN
INTRAMUSCULAR | Status: AC
  Filled 2016-01-16: qty 2

## 2016-01-16 MED ORDER — FENTANYL CITRATE (PF) 100 MCG/2ML IJ SOLN
INTRAMUSCULAR | Status: DC | PRN
  Administered 2016-01-16: 100 ug via INTRAVENOUS

## 2016-01-16 MED ORDER — BUPIVACAINE IN DEXTROSE 0.75-8.25 % IT SOLN
INTRATHECAL | Status: DC | PRN
  Administered 2016-01-16: 2 mL via INTRATHECAL

## 2016-01-16 MED ORDER — ACETAMINOPHEN 650 MG RE SUPP: 650.0000 mg | Freq: Four times a day (QID) | RECTAL | Status: DC | PRN

## 2016-01-16 MED ORDER — PHENOL 1.4 % MT LIQD
1.0000 | OROMUCOSAL | Status: DC | PRN
Start: 2016-01-16 — End: 2016-01-17

## 2016-01-16 MED ORDER — LACTATED RINGERS IV SOLN
INTRAVENOUS | Status: DC
  Administered 2016-01-16: 06:00:00 via INTRAVENOUS

## 2016-01-16 MED ORDER — MIDAZOLAM HCL 5 MG/5ML IJ SOLN
INTRAMUSCULAR | Status: DC | PRN
  Administered 2016-01-16: 2 mg via INTRAVENOUS

## 2016-01-16 MED ORDER — ONDANSETRON HCL 4 MG PO TABS: 4.0000 mg | ORAL_TABLET | Freq: Four times a day (QID) | ORAL | Status: DC | PRN

## 2016-01-16 MED ORDER — BUPIVACAINE HCL 0.25 % IJ SOLN
INTRAMUSCULAR | Status: DC | PRN
  Administered 2016-01-16: 20 mL

## 2016-01-16 MED ORDER — TRANEXAMIC ACID 1000 MG/10ML IV SOLN
1000.0000 mg | INTRAVENOUS | Status: AC
  Administered 2016-01-16: 1000 mg via INTRAVENOUS
  Filled 2016-01-16: qty 10

## 2016-01-16 MED ORDER — PROMETHAZINE HCL 25 MG/ML IJ SOLN: 6.2500 mg | INTRAMUSCULAR | Status: DC | PRN

## 2016-01-16 MED ORDER — METHOCARBAMOL 500 MG PO TABS
500.0000 mg | ORAL_TABLET | Freq: Four times a day (QID) | ORAL | Status: DC | PRN
  Administered 2016-01-16 – 2016-01-17 (×4): 500 mg via ORAL
  Filled 2016-01-16 (×4): qty 1

## 2016-01-16 MED ORDER — PROPOFOL 10 MG/ML IV BOLUS
INTRAVENOUS | Status: DC | PRN
  Administered 2016-01-16: 30 mg via INTRAVENOUS

## 2016-01-16 MED ORDER — DOCUSATE SODIUM 100 MG PO CAPS
100.0000 mg | ORAL_CAPSULE | Freq: Two times a day (BID) | ORAL | Status: DC
Start: 2016-01-16 — End: 2016-01-17
  Administered 2016-01-16 – 2016-01-17 (×3): 100 mg via ORAL

## 2016-01-16 MED ORDER — SODIUM CHLORIDE 0.9 % IJ SOLN
INTRAMUSCULAR | Status: DC | PRN
  Administered 2016-01-16: 30 mL

## 2016-01-16 MED ORDER — BUPIVACAINE LIPOSOME 1.3 % IJ SUSP
20.0000 mL | Freq: Once | INTRAMUSCULAR | Status: DC
  Filled 2016-01-16: qty 20

## 2016-01-16 MED ORDER — PROPOFOL 500 MG/50ML IV EMUL
INTRAVENOUS | Status: DC | PRN
  Administered 2016-01-16: 100 ug/kg/min via INTRAVENOUS

## 2016-01-16 MED ORDER — FLEET ENEMA 7-19 GM/118ML RE ENEM: 1.0000 | ENEMA | Freq: Once | RECTAL | Status: DC | PRN

## 2016-01-16 MED ORDER — MENTHOL 3 MG MT LOZG: 1.0000 | LOZENGE | OROMUCOSAL | Status: DC | PRN

## 2016-01-16 MED ORDER — DEXAMETHASONE SODIUM PHOSPHATE 10 MG/ML IJ SOLN
INTRAMUSCULAR | Status: AC
  Filled 2016-01-16: qty 1

## 2016-01-16 MED ORDER — HYDROMORPHONE HCL 1 MG/ML IJ SOLN
0.2500 mg | INTRAMUSCULAR | Status: DC | PRN
  Administered 2016-01-16 (×2): 0.5 mg via INTRAVENOUS

## 2016-01-16 MED ORDER — DIPHENHYDRAMINE HCL 12.5 MG/5ML PO ELIX: 12.5000 mg | ORAL_SOLUTION | ORAL | Status: DC | PRN

## 2016-01-16 MED ORDER — DEXAMETHASONE SODIUM PHOSPHATE 10 MG/ML IJ SOLN
10.0000 mg | Freq: Once | INTRAMUSCULAR | Status: AC
  Administered 2016-01-16: 10 mg via INTRAVENOUS

## 2016-01-16 MED ORDER — METOCLOPRAMIDE HCL 10 MG PO TABS: 5.0000 mg | ORAL_TABLET | Freq: Three times a day (TID) | ORAL | Status: DC | PRN

## 2016-01-16 MED ORDER — SODIUM CHLORIDE 0.9 % IR SOLN
Status: DC | PRN
  Administered 2016-01-16: 1000 mL

## 2016-01-16 MED ORDER — ONDANSETRON HCL 4 MG/2ML IJ SOLN
INTRAMUSCULAR | Status: DC | PRN
  Administered 2016-01-16: 4 mg via INTRAVENOUS

## 2016-01-16 MED ORDER — POTASSIUM CHLORIDE IN NACL 20-0.9 MEQ/L-% IV SOLN
INTRAVENOUS | Status: DC
  Administered 2016-01-16: 75 mL/h via INTRAVENOUS
  Administered 2016-01-17: 04:00:00 via INTRAVENOUS
  Filled 2016-01-16 (×3): qty 1000

## 2016-01-16 MED ORDER — DEXAMETHASONE SODIUM PHOSPHATE 10 MG/ML IJ SOLN
10.0000 mg | Freq: Once | INTRAMUSCULAR | Status: DC
  Filled 2016-01-16: qty 1

## 2016-01-16 MED ORDER — ONDANSETRON HCL 4 MG/2ML IJ SOLN: 4.0000 mg | Freq: Four times a day (QID) | INTRAMUSCULAR | Status: DC | PRN

## 2016-01-16 MED ORDER — EPHEDRINE SULFATE 50 MG/ML IJ SOLN
INTRAMUSCULAR | Status: AC
  Filled 2016-01-16: qty 1

## 2016-01-16 MED ORDER — LACTATED RINGERS IV SOLN
INTRAVENOUS | Status: DC | PRN
  Administered 2016-01-16 (×2): via INTRAVENOUS

## 2016-01-16 MED ORDER — CITALOPRAM HYDROBROMIDE 20 MG PO TABS
20.0000 mg | ORAL_TABLET | Freq: Two times a day (BID) | ORAL | Status: DC
Start: 2016-01-17 — End: 2016-01-17
  Administered 2016-01-17: 20 mg via ORAL
  Filled 2016-01-16 (×2): qty 1

## 2016-01-16 MED ORDER — MIDAZOLAM HCL 2 MG/2ML IJ SOLN
INTRAMUSCULAR | Status: AC
  Filled 2016-01-16: qty 2

## 2016-01-16 MED ORDER — SODIUM CHLORIDE 0.9 % IV SOLN: INTRAVENOUS | Status: DC

## 2016-01-16 MED ORDER — SIMVASTATIN 10 MG PO TABS
10.0000 mg | ORAL_TABLET | Freq: Every day | ORAL | Status: DC
Start: 2016-01-16 — End: 2016-01-17
  Administered 2016-01-16 – 2016-01-17 (×2): 10 mg via ORAL
  Filled 2016-01-16 (×2): qty 1

## 2016-01-16 MED ORDER — PROPOFOL 10 MG/ML IV BOLUS
INTRAVENOUS | Status: AC
  Filled 2016-01-16: qty 60

## 2016-01-16 MED ORDER — POLYETHYLENE GLYCOL 3350 17 G PO PACK: 17.0000 g | PACK | Freq: Every day | ORAL | Status: DC | PRN

## 2016-01-16 MED ORDER — KETOROLAC TROMETHAMINE 15 MG/ML IJ SOLN
7.5000 mg | Freq: Four times a day (QID) | INTRAMUSCULAR | Status: AC | PRN
  Administered 2016-01-16: 7.5 mg via INTRAVENOUS
  Filled 2016-01-16: qty 1

## 2016-01-16 MED ORDER — HYDROCHLOROTHIAZIDE 25 MG PO TABS
12.5000 mg | ORAL_TABLET | Freq: Every day | ORAL | Status: DC
  Filled 2016-01-16: qty 0.5

## 2016-01-16 MED ORDER — SODIUM CHLORIDE 0.9 % IJ SOLN
INTRAMUSCULAR | Status: AC
  Filled 2016-01-16: qty 50

## 2016-01-16 MED ORDER — RIVAROXABAN 10 MG PO TABS
10.0000 mg | ORAL_TABLET | Freq: Every day | ORAL | Status: DC
  Administered 2016-01-17: 10 mg via ORAL
  Filled 2016-01-16 (×2): qty 1

## 2016-01-16 MED ORDER — ACETAMINOPHEN 325 MG PO TABS: 650.0000 mg | ORAL_TABLET | Freq: Four times a day (QID) | ORAL | Status: DC | PRN

## 2016-01-16 MED ORDER — HYDROMORPHONE HCL 2 MG PO TABS
2.0000 mg | ORAL_TABLET | ORAL | Status: DC | PRN
  Administered 2016-01-16 (×3): 2 mg via ORAL
  Administered 2016-01-16 – 2016-01-17 (×5): 4 mg via ORAL
  Filled 2016-01-16: qty 2
  Filled 2016-01-16 (×2): qty 1
  Filled 2016-01-16 (×3): qty 2
  Filled 2016-01-16: qty 1
  Filled 2016-01-16: qty 2

## 2016-01-16 MED ORDER — HYDROMORPHONE HCL 1 MG/ML IJ SOLN
0.5000 mg | INTRAMUSCULAR | Status: DC | PRN
  Administered 2016-01-17: 0.5 mg via INTRAVENOUS
  Filled 2016-01-16: qty 1

## 2016-01-16 MED ORDER — VANCOMYCIN HCL IN DEXTROSE 1-5 GM/200ML-% IV SOLN
1000.0000 mg | Freq: Two times a day (BID) | INTRAVENOUS | Status: AC
  Administered 2016-01-16: 1000 mg via INTRAVENOUS
  Filled 2016-01-16: qty 200

## 2016-01-16 MED ORDER — CHLORHEXIDINE GLUCONATE 4 % EX LIQD: 60.0000 mL | Freq: Once | CUTANEOUS | Status: DC

## 2016-01-16 MED ORDER — KETOROLAC TROMETHAMINE 30 MG/ML IJ SOLN: 30.0000 mg | Freq: Once | INTRAMUSCULAR | Status: DC

## 2016-01-16 MED ORDER — DIAZEPAM 2 MG PO TABS
2.0000 mg | ORAL_TABLET | Freq: Two times a day (BID) | ORAL | Status: DC | PRN
  Administered 2016-01-17: 2 mg via ORAL
  Filled 2016-01-16 (×2): qty 1

## 2016-01-16 SURGICAL SUPPLY — 51 items
BAG DECANTER FOR FLEXI CONT (MISCELLANEOUS) ×2 IMPLANT
BAG ZIPLOCK 12X15 (MISCELLANEOUS) ×2 IMPLANT
BANDAGE ACE 6X5 VEL STRL LF (GAUZE/BANDAGES/DRESSINGS) IMPLANT
BANDAGE ELASTIC 6 VELCRO ST LF (GAUZE/BANDAGES/DRESSINGS) ×2 IMPLANT
BLADE SAG 18X100X1.27 (BLADE) ×2 IMPLANT
BLADE SAW SGTL 11.0X1.19X90.0M (BLADE) ×2 IMPLANT
BOWL SMART MIX CTS (DISPOSABLE) ×2 IMPLANT
CAPT KNEE TOTAL 3 ATTUNE ×2 IMPLANT
CEMENT HV SMART SET (Cement) ×4 IMPLANT
CLOTH BEACON ORANGE TIMEOUT ST (SAFETY) ×2 IMPLANT
CUFF TOURN SGL QUICK 34 (TOURNIQUET CUFF) ×2
CUFF TRNQT CYL 34X4X40X1 (TOURNIQUET CUFF) ×1 IMPLANT
DECANTER SPIKE VIAL GLASS SM (MISCELLANEOUS) ×2 IMPLANT
DRAPE U-SHAPE 47X51 STRL (DRAPES) ×2 IMPLANT
DRSG ADAPTIC 3X8 NADH LF (GAUZE/BANDAGES/DRESSINGS) ×2 IMPLANT
DRSG PAD ABDOMINAL 8X10 ST (GAUZE/BANDAGES/DRESSINGS) IMPLANT
DURAPREP 26ML APPLICATOR (WOUND CARE) ×2 IMPLANT
ELECT REM PT RETURN 9FT ADLT (ELECTROSURGICAL) ×2
ELECTRODE REM PT RTRN 9FT ADLT (ELECTROSURGICAL) ×1 IMPLANT
EVACUATOR 1/8 PVC DRAIN (DRAIN) ×2 IMPLANT
GAUZE SPONGE 4X4 12PLY STRL (GAUZE/BANDAGES/DRESSINGS) ×2 IMPLANT
GLOVE BIO SURGEON STRL SZ7.5 (GLOVE) IMPLANT
GLOVE BIO SURGEON STRL SZ8 (GLOVE) ×2 IMPLANT
GLOVE BIOGEL PI IND STRL 6.5 (GLOVE) IMPLANT
GLOVE BIOGEL PI IND STRL 8 (GLOVE) ×1 IMPLANT
GLOVE BIOGEL PI INDICATOR 6.5 (GLOVE)
GLOVE BIOGEL PI INDICATOR 8 (GLOVE) ×1
GLOVE SURG SS PI 6.5 STRL IVOR (GLOVE) IMPLANT
GOWN STRL REUS W/TWL LRG LVL3 (GOWN DISPOSABLE) ×2 IMPLANT
GOWN STRL REUS W/TWL XL LVL3 (GOWN DISPOSABLE) IMPLANT
HANDPIECE INTERPULSE COAX TIP (DISPOSABLE) ×2
IMMOBILIZER KNEE 20 (SOFTGOODS) ×2 IMPLANT
IMMOBILIZER KNEE 20 THIGH 36 (SOFTGOODS) IMPLANT
MANIFOLD NEPTUNE II (INSTRUMENTS) ×2 IMPLANT
NS IRRIG 1000ML POUR BTL (IV SOLUTION) ×2 IMPLANT
PACK TOTAL KNEE CUSTOM (KITS) ×2 IMPLANT
PAD ABD 8X10 STRL (GAUZE/BANDAGES/DRESSINGS) ×2 IMPLANT
PADDING CAST COTTON 6X4 STRL (CAST SUPPLIES) ×2 IMPLANT
POSITIONER SURGICAL ARM (MISCELLANEOUS) ×2 IMPLANT
SET HNDPC FAN SPRY TIP SCT (DISPOSABLE) ×1 IMPLANT
STRIP CLOSURE SKIN 1/2X4 (GAUZE/BANDAGES/DRESSINGS) ×2 IMPLANT
SUT MNCRL AB 4-0 PS2 18 (SUTURE) ×2 IMPLANT
SUT VIC AB 2-0 CT1 27 (SUTURE) ×6
SUT VIC AB 2-0 CT1 TAPERPNT 27 (SUTURE) ×3 IMPLANT
SUT VLOC 180 0 24IN GS25 (SUTURE) ×2 IMPLANT
SYR 50ML LL SCALE MARK (SYRINGE) ×2 IMPLANT
TRAY FOLEY W/METER SILVER 14FR (SET/KITS/TRAYS/PACK) ×2 IMPLANT
TRAY FOLEY W/METER SILVER 16FR (SET/KITS/TRAYS/PACK) IMPLANT
WATER STERILE IRR 1500ML POUR (IV SOLUTION) ×2 IMPLANT
WRAP KNEE MAXI GEL POST OP (GAUZE/BANDAGES/DRESSINGS) ×2 IMPLANT
YANKAUER SUCT BULB TIP 10FT TU (MISCELLANEOUS) ×2 IMPLANT

## 2016-01-16 NOTE — Anesthesia Procedure Notes (Signed)
Spinal  Staffing Anesthesiologist: Suzette Battiest Performed by: anesthesiologist  Preanesthetic Checklist Completed: patient identified, site marked, surgical consent, pre-op evaluation, timeout performed, IV checked, risks and benefits discussed and monitors and equipment checked Spinal Block Patient position: sitting Prep: ChloraPrep Patient monitoring: heart rate, continuous pulse ox and blood pressure Approach: midline Location: L5-S1 Injection technique: single-shot Needle Needle type: Pencan  Needle gauge: 24 G Needle length: 9 cm

## 2016-01-16 NOTE — Transfer of Care (Signed)
Immediate Anesthesia Transfer of Care Note  Patient: Rose Rose  Procedure(s) Performed: Procedure(s): TOTAL LEFT KNEE ARTHROPLASTY (Left)  Patient Location: PACU  Anesthesia Type:Spinal  Level of Consciousness:  sedated, patient cooperative and responds to stimulation  Airway & Oxygen Therapy:Patient Spontanous Breathing and Patient connected to face mask oxgen  Post-op Assessment:  Report given to PACU RN and Post -op Vital signs reviewed and stable  Post vital signs:  Reviewed and stable  Last Vitals:  Filed Vitals:   01/16/16 0522  BP: 126/77  Pulse: 55  Temp: 36.6 C  Resp: 16    Complications: No apparent anesthesia complications

## 2016-01-16 NOTE — Anesthesia Postprocedure Evaluation (Signed)
Anesthesia Post Note  Patient: Rose Rose  Procedure(s) Performed: Procedure(s) (LRB): TOTAL LEFT KNEE ARTHROPLASTY (Left)  Patient location during evaluation: PACU Anesthesia Type: Spinal Level of consciousness: oriented and awake and alert Pain management: pain level controlled Vital Signs Assessment: post-procedure vital signs reviewed and stable Respiratory status: spontaneous breathing, respiratory function stable and patient connected to nasal cannula oxygen Cardiovascular status: blood pressure returned to baseline and stable Postop Assessment: no headache and no backache Anesthetic complications: no    Last Vitals:  Filed Vitals:   01/16/16 1030 01/16/16 1045  BP: 143/75 132/78  Pulse: 53 55  Temp:  36.6 C  Resp: 15 11    Last Pain:  Filed Vitals:   01/16/16 1051  PainSc: 5                  Tiajuana Amass

## 2016-01-16 NOTE — Op Note (Signed)
Pre-operative diagnosis- Osteoarthritis  Left knee(s)  Post-operative diagnosis- Osteoarthritis Left knee(s)  Procedure-  Left  Total Knee Arthroplasty  Surgeon- Dione Plover. Adnan Vanvoorhis, MD  Assistant- Arlee Muslim, PA-C   Anesthesia-  Spinal  EBL-* No blood loss amount entered *   Drains Hemovac  Tourniquet time-  Total Tourniquet Time Documented: Thigh (Left) - 35 minutes Total: Thigh (Left) - 35 minutes     Complications- None  Condition-PACU - hemodynamically stable.   Brief Clinical Note  Rose Rose is a 60 y.o. year old female with end stage OA of her left knee with progressively worsening pain and dysfunction. She has constant pain, with activity and at rest and significant functional deficits with difficulties even with ADLs. She has had extensive non-op management including analgesics, injections of cortisone, and home exercise program, but remains in significant pain with significant dysfunction. Radiographs show bone on bone arthritis medial and patellofemoral. She presents now for left Total Knee Arthroplasty.    Procedure in detail---   The patient is brought into the operating room and positioned supine on the operating table. After successful administration of  Spinal,   a tourniquet is placed high on the  Left thigh(s) and the lower extremity is prepped and draped in the usual sterile fashion. Time out is performed by the operating team and then the  Left lower extremity is wrapped in Esmarch, knee flexed and the tourniquet inflated to 300 mmHg.       A midline incision is made with a ten blade through the subcutaneous tissue to the level of the extensor mechanism. A fresh blade is used to make a medial parapatellar arthrotomy. Soft tissue over the proximal medial tibia is subperiosteally elevated to the joint line with a knife and into the semimembranosus bursa with a Cobb elevator. Soft tissue over the proximal lateral tibia is elevated with attention being paid to avoiding  the patellar tendon on the tibial tubercle. The patella is everted, knee flexed 90 degrees and the ACL and PCL are removed. Findings are bone on bone medial and patellofemoral with large global osteophytes.        The drill is used to create a starting hole in the distal femur and the canal is thoroughly irrigated with sterile saline to remove the fatty contents. The 5 degree Left  valgus alignment guide is placed into the femoral canal and the distal femoral cutting block is pinned to remove 10 mm off the distal femur. Resection is made with an oscillating saw.      The tibia is subluxed forward and the menisci are removed. The extramedullary alignment guide is placed referencing proximally at the medial aspect of the tibial tubercle and distally along the second metatarsal axis and tibial crest. The block is pinned to remove 52mm off the more deficient medial  side. Resection is made with an oscillating saw. Size 5is the most appropriate size for the tibia and the proximal tibia is prepared with the modular drill and keel punch for that size.      The femoral sizing guide is placed and size 6 is most appropriate. Rotation is marked off the epicondylar axis and confirmed by creating a rectangular flexion gap at 90 degrees. The size 6 cutting block is pinned in this rotation and the anterior, posterior and chamfer cuts are made with the oscillating saw. The intercondylar block is then placed and that cut is made.      Trial size 5 tibial component, trial size 6  narrow posterior stabilized femur and a 6  mm posterior stabilized rotating platform insert trial is placed. Full extension is achieved with excellent varus/valgus and anterior/posterior balance throughout full range of motion. The patella is everted and thickness measured to be 22  mm. Free hand resection is taken to 12 mm, a 35 template is placed, lug holes are drilled, trial patella is placed, and it tracks normally. Osteophytes are removed off the  posterior femur with the trial in place. All trials are removed and the cut bone surfaces prepared with pulsatile lavage. Cement is mixed and once ready for implantation, the size 5 tibial implant, size  6 narrow posterior stabilized femoral component, and the size 35 patella are cemented in place and the patella is held with the clamp. The trial insert is placed and the knee held in full extension. The Exparel (20 ml mixed with 30 ml saline) and .25% Bupivicaine, are injected into the extensor mechanism, posterior capsule, medial and lateral gutters and subcutaneous tissues.  All extruded cement is removed and once the cement is hard the permanent 6 mm posterior stabilized rotating platform insert is placed into the tibial tray.      The wound is copiously irrigated with saline solution and the extensor mechanism closed over a hemovac drain with #1 V-loc suture. The tourniquet is released for a total tourniquet time of 35  minutes. Flexion against gravity is 140 degrees and the patella tracks normally. Subcutaneous tissue is closed with 2.0 vicryl and subcuticular with running 4.0 Monocryl. The incision is cleaned and dried and steri-strips and a bulky sterile dressing are applied. The limb is placed into a knee immobilizer and the patient is awakened and transported to recovery in stable condition.      Please note that a surgical assistant was a medical necessity for this procedure in order to perform it in a safe and expeditious manner. Surgical assistant was necessary to retract the ligaments and vital neurovascular structures to prevent injury to them and also necessary for proper positioning of the limb to allow for anatomic placement of the prosthesis.   Dione Plover Rose Seaman, MD    01/16/2016, 9:11 AM

## 2016-01-16 NOTE — Interval H&P Note (Signed)
History and Physical Interval Note:  01/16/2016 6:45 AM  Rose Rose  has presented today for surgery, with the diagnosis of LEFT KNEE OA   The various methods of treatment have been discussed with the patient and family. After consideration of risks, benefits and other options for treatment, the patient has consented to  Procedure(s): TOTAL LEFT KNEE ARTHROPLASTY (Left) as a surgical intervention .  The patient's history has been reviewed, patient examined, no change in status, stable for surgery.  I have reviewed the patient's chart and labs.  Questions were answered to the patient's satisfaction.     Gearlean Alf

## 2016-01-16 NOTE — Evaluation (Signed)
Physical Therapy Evaluation Patient Details Name: Rose Rose MRN: [id number] DOB: 05/19/1956 Today's Date: 01/16/2016   History of Present Illness  s/p LTKA , and history of R foot surgery and R TKA few years ago.   Clinical Impression  Pt is s/p L TKA resulting in the deficits listed below (see PT Problem List).  Pt will benefit from continued PT to increase their independence and safety with mobility to allow discharge to home with spouse.      Follow Up Recommendations Home health PT    Equipment Recommendations  None recommended by PT    Recommendations for Other Services       Precautions / Restrictions Precautions Precautions: Knee Required Braces or Orthoses: Knee Immobilizer - Left Knee Immobilizer - Left: On when out of bed or walking;Discontinue once straight leg raise with < 10 degree lag Restrictions Weight Bearing Restrictions: No (WBAT)      Mobility  Bed Mobility Overal bed mobility: Needs Assistance Bed Mobility: Supine to Sit;Sit to Supine     Supine to sit: Min guard;HOB elevated     General bed mobility comments: just cues for scooting and hand placement   Transfers Overall transfer level: Needs assistance Equipment used: Rolling walker (2 wheeled) Transfers: Sit to/from Stand Sit to Stand: Min guard         General transfer comment: cues to place hands for RW use  and safety   Ambulation/Gait Ambulation/Gait assistance: Min guard Ambulation Distance (Feet): 80 Feet Assistive device: Rolling walker (2 wheeled) Gait Pattern/deviations: Step-to pattern     General Gait Details: encouraged step to pattern today, however, pt almost ready for step through pattern, just minimizing pain and encouraging safety today.   Stairs            Wheelchair Mobility    Modified Rankin (Stroke Patients Only)       Balance Overall balance assessment: No apparent balance deficits (not formally assessed)                                            Pertinent Vitals/Pain Pain Assessment: 0-10 Pain Score: 4  Pain Location: L knee area  Pain Descriptors / Indicators: Aching Pain Intervention(s): Monitored during session;Premedicated before session;Ice applied    Home Living Family/patient expects to be discharged to:: Private residence Living Arrangements: Spouse/significant other Available Help at Discharge: Family Type of Home: House Home Access: Stairs to enter Entrance Stairs-Rails: Right Entrance Stairs-Number of Steps: 1 Home Layout: One level Home Equipment: Environmental consultant - 2 wheels;Crutches      Prior Function Level of Independence: Independent         Comments: works out every day, and still works     Journalist, newspaper        Extremity/Trunk Assessment               Lower Extremity Assessment: LLE deficits/detail   LLE Deficits / Details: limited knee flexion 0-50 in supine      Communication   Communication: No difficulties  Cognition Arousal/Alertness: Awake/alert Behavior During Therapy: WFL for tasks assessed/performed Overall Cognitive Status: Within Functional Limits for tasks assessed                      General Comments      Exercises Total Joint Exercises Ankle Circles/Pumps: AROM;10 reps;Both Quad Sets: AROM;Left;10 reps;Supine Heel Slides: AAROM;Supine;Left;10  reps Straight Leg Raises: AAROM;Supine;Left;5 reps Goniometric ROM: 0-50 supine       Assessment/Plan    PT Assessment Patient needs continued PT services  PT Diagnosis Difficulty walking   PT Problem List Decreased strength;Decreased range of motion;Decreased activity tolerance;Decreased mobility;Decreased knowledge of use of DME  PT Treatment Interventions DME instruction;Gait training;Stair training;Functional mobility training;Therapeutic activities;Patient/family education   PT Goals (Current goals can be found in the Care Plan section) Acute Rehab PT Goals Patient Stated Goal: I want to  get home to my cat ASAP  PT Goal Formulation: With patient Time For Goal Achievement: 01/23/16 Potential to Achieve Goals: Good    Frequency 7X/week   Barriers to discharge        Co-evaluation               End of Session Equipment Utilized During Treatment: Gait belt Activity Tolerance: Patient tolerated treatment well Patient left: in chair;with call bell/phone within reach;with chair alarm set;with family/visitor present           Time: 1645-1716 PT Time Calculation (min) (ACUTE ONLY): 31 min   Charges:   PT Evaluation $PT Eval Low Complexity: 1 Procedure PT Treatments $Gait Training: 8-22 mins   PT G CodesClide Dales Feb 09, 2016, 6:33 PM  Clide Dales, PT Pager: [phone] 2016-02-09

## 2016-01-17 LAB — BASIC METABOLIC PANEL
Anion gap: 9 (ref 5–15)
BUN: 13 mg/dL (ref 6–20)
CALCIUM: 9.1 mg/dL (ref 8.9–10.3)
CO2: 25 mmol/L (ref 22–32)
Chloride: 105 mmol/L (ref 101–111)
Creatinine, Ser: 0.54 mg/dL (ref 0.44–1.00)
Glucose, Bld: 113 mg/dL — ABNORMAL HIGH (ref 65–99)
POTASSIUM: 3.8 mmol/L (ref 3.5–5.1)
SODIUM: 139 mmol/L (ref 135–145)

## 2016-01-17 LAB — CBC
HCT: 41.3 % (ref 36.0–46.0)
Hemoglobin: 13.7 g/dL (ref 12.0–15.0)
MCH: 28.4 pg (ref 26.0–34.0)
MCHC: 33.2 g/dL (ref 30.0–36.0)
MCV: 85.7 fL (ref 78.0–100.0)
PLATELETS: 272 10*3/uL (ref 150–400)
RBC: 4.82 MIL/uL (ref 3.87–5.11)
RDW: 14.9 % (ref 11.5–15.5)
WBC: 12.2 10*3/uL — AB (ref 4.0–10.5)

## 2016-01-17 MED ORDER — DIAZEPAM 2 MG PO TABS
2.0000 mg | ORAL_TABLET | Freq: Once | ORAL | Status: AC
  Administered 2016-01-17: 2 mg via ORAL

## 2016-01-17 MED ORDER — METHOCARBAMOL 500 MG PO TABS: 500.0000 mg | ORAL_TABLET | Freq: Four times a day (QID) | ORAL | Status: DC | PRN

## 2016-01-17 MED ORDER — HYDROCHLOROTHIAZIDE 12.5 MG PO CAPS
12.5000 mg | ORAL_CAPSULE | Freq: Every day | ORAL | Status: DC
  Administered 2016-01-17: 12.5 mg via ORAL
  Filled 2016-01-17: qty 1

## 2016-01-17 MED ORDER — HYDROMORPHONE HCL 2 MG PO TABS: 2.0000 mg | ORAL_TABLET | ORAL | Status: DC | PRN

## 2016-01-17 MED ORDER — RIVAROXABAN 10 MG PO TABS: 10.0000 mg | ORAL_TABLET | Freq: Every day | ORAL | Status: DC

## 2016-01-17 NOTE — Progress Notes (Signed)
   Subjective: 1 Day Post-Op Procedure(s) (LRB): TOTAL LEFT KNEE ARTHROPLASTY (Left) Patient reports pain as mild.   Patient seen in rounds with Dr. Wynelle Link.  Sitting up in bed.  Doing well and wants to go home. Patient is well, and has had no acute complaints or problems Patient is ready to go home later this morning.  Objective: Vital signs in last 24 hours: Temp:  [97.4 F (36.3 C)-98.6 F (37 C)] 98.5 F (36.9 C) (03/28 0440) Pulse Rate:  [50-68] 60 (03/28 0715) Resp:  [11-18] 16 (03/28 0715) BP: (129-179)/(57-91) 161/86 mmHg (03/28 0440) SpO2:  [96 %-100 %] 98 % (03/28 0715) Weight:  [82.555 kg (182 lb)] 82.555 kg (182 lb) (03/27 1100)  Intake/Output from previous day:  Intake/Output Summary (Last 24 hours) at 01/17/16 0853 Last data filed at 01/17/16 0715  Gross per 24 hour  Intake 2992.5 ml  Output   3865 ml  Net -872.5 ml    Intake/Output this shift: Total I/O In: 130 [P.O.:60; I.V.:70] Out: -   Labs:  Recent Labs  01/17/16 0410  HGB 13.7    Recent Labs  01/17/16 0410  WBC 12.2*  RBC 4.82  HCT 41.3  PLT 272    Recent Labs  01/17/16 0410  NA 139  K 3.8  CL 105  CO2 25  BUN 13  CREATININE 0.54  GLUCOSE 113*  CALCIUM 9.1   No results for input(s): LABPT, INR in the last 72 hours.  EXAM: General - Patient is Alert, Appropriate and Oriented Extremity - Neurovascular intact Sensation intact distally Incision - clean, dry Motor Function - intact, moving foot and toes well on exam.  HV pulled.  Assessment/Plan: 1 Day Post-Op Procedure(s) (LRB): TOTAL LEFT KNEE ARTHROPLASTY (Left) Procedure(s) (LRB): TOTAL LEFT KNEE ARTHROPLASTY (Left) Past Medical History  Diagnosis Date  . Hyperlipidemia   . Hypertension   . Arthritis   . Depression   . Insomnia   . Complication of anesthesia   . PONV (postoperative nausea and vomiting)   . Pneumonia    Principal Problem:   OA (osteoarthritis) of knee  Estimated body mass index is 34.95  kg/(m^2) as calculated from the following:   Height as of this encounter: 5' 0.5" (1.537 m).   Weight as of this encounter: 82.555 kg (182 lb). Advance diet Up with therapy Discharge home with home health Diet - Cardiac diet Follow up - in 2 weeks on Tuesday 01/31/2016 Activity - WBAT Disposition - Home Condition Upon Discharge - Good D/C Meds - See DC Summary DVT Prophylaxis - Xarelto  Arlee Muslim, PA-C Orthopaedic Surgery 01/17/2016, 8:53 AM

## 2016-01-17 NOTE — Care Management Note (Addendum)
Case Management Note  Patient Details  Name: Rose Rose MRN: [id number] Date of Birth: 10/04/1956  Subjective/Objective:                  TOTAL LEFT KNEE ARTHROPLASTY (Left) Action/Plan:discharge planning Expected Discharge Date:  01/17/16               Expected Discharge Plan:  Home w Home Health Services  In-House Referral:     Discharge planning Services  CM Consult  Post Acute Care Choice:  Home Health Choice offered to:  Patient  DME Arranged:  N/A DME Agency:  NA  HH Arranged:  PT HH Agency:  [facility]  Status of Service:  Completed, signed off  Medicare Important Message Given:    Date Medicare IM Given:    Medicare IM give by:    Date Additional Medicare IM Given:    Additional Medicare Important Message give by:     If discussed at Long Length of Stay Meetings, dates discussed:    Additional Comments: CM met with pt in room to offer choice of home health agency. Pt chooses [facility] to render HHPT. Referral given to [facility] rep, Tim (on unit). Pt has DME from previous surgery.  No other CM needs were communicated. Jeffries, Sarah Christine, RN 01/17/2016, 12:00 PM  

## 2016-01-17 NOTE — Discharge Instructions (Addendum)
° °Dr. Frank Aluisio °Total Joint Specialist °[facility] °[address] °[place], [place] [address] °[phone] ° °TOTAL KNEE REPLACEMENT POSTOPERATIVE DIRECTIONS ° °Knee Rehabilitation, Guidelines Following Surgery  °Results after knee surgery are often greatly improved when you follow the exercise, range of motion and muscle strengthening exercises prescribed by your doctor. Safety measures are also important to protect the knee from further injury. Any time any of these exercises cause you to have increased pain or swelling in your knee joint, decrease the amount until you are comfortable again and slowly increase them. If you have problems or questions, call your caregiver or physical therapist for advice.  ° °HOME CARE INSTRUCTIONS  °Remove items at home which could result in a fall. This includes throw rugs or furniture in walking pathways.  °· ICE to the affected knee every three hours for 30 minutes at a time and then as needed for pain and swelling.  Continue to use ice on the knee for pain and swelling from surgery. You may notice swelling that will progress down to the foot and ankle.  This is normal after surgery.  Elevate the leg when you are not up walking on it.   °· Continue to use the breathing machine which will help keep your temperature down.  It is common for your temperature to cycle up and down following surgery, especially at night when you are not up moving around and exerting yourself.  The breathing machine keeps your lungs expanded and your temperature down. °· Do not place pillow under knee, focus on keeping the knee straight while resting ° °DIET °You may resume your previous home diet once your are discharged from the hospital. ° °DRESSING / WOUND CARE / SHOWERING °You may shower 3 days after surgery, but keep the wounds dry during showering.  You may use an occlusive plastic wrap (Press'n Seal for example), NO SOAKING/SUBMERGING IN THE BATHTUB.  If the  bandage gets wet, change with a clean dry gauze.  If the incision gets wet, pat the wound dry with a clean towel. °You may start showering once you are discharged home but do not submerge the incision under water. Just pat the incision dry and apply a dry gauze dressing on daily. °Change the surgical dressing daily and reapply a dry dressing each time. ° °ACTIVITY °Walk with your walker as instructed. °Use walker as long as suggested by your caregivers. °Avoid periods of inactivity such as sitting longer than an hour when not asleep. This helps prevent blood clots.  °You may resume a sexual relationship in one month or when given the OK by your doctor.  °You may return to work once you are cleared by your doctor.  °Do not drive a car for 6 weeks or until released by you surgeon.  °Do not drive while taking narcotics. ° °WEIGHT BEARING °Weight bearing as tolerated with assist device (walker, cane, etc) as directed, use it as long as suggested by your surgeon or therapist, typically at least 4-6 weeks. ° °POSTOPERATIVE CONSTIPATION PROTOCOL °Constipation - defined medically as fewer than three stools per week and severe constipation as less than one stool per week. ° °One of the most common issues patients have following surgery is constipation.  Even if you have a regular bowel pattern at home, your normal regimen is likely to be disrupted due to multiple reasons following surgery.  Combination of anesthesia, postoperative narcotics, change in appetite and fluid intake all can affect your bowels.    In order to avoid complications following surgery, here are some recommendations in order to help you during your recovery period. ° °Colace (docusate) - Pick up an over-the-counter form of Colace or another stool softener and take twice a day as long as you are requiring postoperative pain medications.  Take with a full glass of water daily.  If you experience loose stools or diarrhea, hold the colace until you stool forms  back up.  If your symptoms do not get better within 1 week or if they get worse, check with your doctor. ° °Dulcolax (bisacodyl) - Pick up over-the-counter and take as directed by the product packaging as needed to assist with the movement of your bowels.  Take with a full glass of water.  Use this product as needed if not relieved by Colace only.  ° °MiraLax (polyethylene glycol) - Pick up over-the-counter to have on hand.  MiraLax is a solution that will increase the amount of water in your bowels to assist with bowel movements.  Take as directed and can mix with a glass of water, juice, soda, coffee, or tea.  Take if you go more than two days without a movement. °Do not use MiraLax more than once per day. Call your doctor if you are still constipated or irregular after using this medication for 7 days in a row. ° °If you continue to have problems with postoperative constipation, please contact the office for further assistance and recommendations.  If you experience "the worst abdominal pain ever" or develop nausea or vomiting, please contact the office immediatly for further recommendations for treatment. ° °ITCHING ° If you experience itching with your medications, try taking only a single pain pill, or even half a pain pill at a time.  You can also use Benadryl over the counter for itching or also to help with sleep.  ° °TED HOSE STOCKINGS °Wear the elastic stockings on both legs for three weeks following surgery during the day but you may remove then at night for sleeping. ° °MEDICATIONS °See your medication summary on the “After Visit Summary” that the nursing staff will review with you prior to discharge.  You may have some home medications which will be placed on hold until you complete the course of blood thinner medication.  It is important for you to complete the blood thinner medication as prescribed by your surgeon.  Continue your approved medications as instructed at time of  discharge. ° °PRECAUTIONS °If you experience chest pain or shortness of breath - call 911 immediately for transfer to the hospital emergency department.  °If you develop a fever greater that 101 F, purulent drainage from wound, increased redness or drainage from wound, foul odor from the wound/dressing, or calf pain - CONTACT YOUR SURGEON.   °                                                °FOLLOW-UP APPOINTMENTS °Make sure you keep all of your appointments after your operation with your surgeon and caregivers. You should call the office at the above phone number and make an appointment for approximately two weeks after the date of your surgery or on the date instructed by your surgeon outlined in the "After Visit Summary". ° ° °RANGE OF MOTION AND STRENGTHENING EXERCISES  °Rehabilitation of the knee is important following a knee injury or   an operation. After just a few days of immobilization, the muscles of the thigh which control the knee become weakened and shrink (atrophy). Knee exercises are designed to build up the tone and strength of the thigh muscles and to improve knee motion. Often times heat used for twenty to thirty minutes before working out will loosen up your tissues and help with improving the range of motion but do not use heat for the first two weeks following surgery. These exercises can be done on a training (exercise) mat, on the floor, on a table or on a bed. Use what ever works the best and is most comfortable for you Knee exercises include:  °Leg Lifts - While your knee is still immobilized in a splint or cast, you can do straight leg raises. Lift the leg to 60 degrees, hold for 3 sec, and slowly lower the leg. Repeat 10-20 times 2-3 times daily. Perform this exercise against resistance later as your knee gets better.  °Quad and Hamstring Sets - Tighten up the muscle on the front of the thigh (Quad) and hold for 5-10 sec. Repeat this 10-20 times hourly. Hamstring sets are done by pushing the  foot backward against an object and holding for 5-10 sec. Repeat as with quad sets.  °· Leg Slides: Lying on your back, slowly slide your foot toward your buttocks, bending your knee up off the floor (only go as far as is comfortable). Then slowly slide your foot back down until your leg is flat on the floor again. °· Angel Wings: Lying on your back spread your legs to the side as far apart as you can without causing discomfort.  °A rehabilitation program following serious knee injuries can speed recovery and prevent re-injury in the future due to weakened muscles. Contact your doctor or a physical therapist for more information on knee rehabilitation.  ° °IF YOU ARE TRANSFERRED TO A SKILLED REHAB FACILITY °If the patient is transferred to a skilled rehab facility following release from the hospital, a list of the current medications will be sent to the facility for the patient to continue.  When discharged from the skilled rehab facility, please have the facility set up the patient's Home Health Physical Therapy prior to being released. Also, the skilled facility will be responsible for providing the patient with their medications at time of release from the facility to include their pain medication, the muscle relaxants, and their blood thinner medication. If the patient is still at the rehab facility at time of the two week follow up appointment, the skilled rehab facility will also need to assist the patient in arranging follow up appointment in our office and any transportation needs. ° °MAKE SURE YOU:  °Understand these instructions.  °Get help right away if you are not doing well or get worse.  ° ° °Pick up stool softner and laxative for home use following surgery while on pain medications. °Do not submerge incision under water. °Please use good hand washing techniques while changing dressing each day. °May shower starting three days after surgery. °Please use a clean towel to pat the incision dry following  showers. °Continue to use ice for pain and swelling after surgery. °Do not use any lotions or creams on the incision until instructed by your surgeon. ° °Take Xarelto for two and a half more weeks, then discontinue Xarelto. °Once the patient has completed the blood thinner regimen, then take a Baby 81 mg Aspirin daily for three more weeks. ° ° °Information   on my medicine - XARELTO® (Rivaroxaban) ° °This medication education was reviewed with me or my healthcare representative as part of my discharge preparation.  The pharmacist that spoke with me during my hospital stay was:  Runyon, Amanda, RPH ° °Why was Xarelto® prescribed for you? °Xarelto® was prescribed for you to reduce the risk of blood clots forming after orthopedic surgery. The medical term for these abnormal blood clots is venous thromboembolism (VTE). ° °What do you need to know about xarelto® ? °Take your Xarelto® ONCE DAILY at the same time every day. °You may take it either with or without food. ° °If you have difficulty swallowing the tablet whole, you may crush it and mix in applesauce just prior to taking your dose. ° °Take Xarelto® exactly as prescribed by your doctor and DO NOT stop taking Xarelto® without talking to the doctor who prescribed the medication.  Stopping without other VTE prevention medication to take the place of Xarelto® may increase your risk of developing a clot. ° °After discharge, you should have regular check-up appointments with your healthcare provider that is prescribing your Xarelto®.   ° °What do you do if you miss a dose? °If you miss a dose, take it as soon as you remember on the same day then continue your regularly scheduled once daily regimen the next day. Do not take two doses of Xarelto® on the same day.  ° °Important Safety Information °A possible side effect of Xarelto® is bleeding. You should call your healthcare provider right away if you experience any of the following: °? Bleeding from an injury or your nose  that does not stop. °? Unusual colored urine (red or dark brown) or unusual colored stools (red or black). °? Unusual bruising for unknown reasons. °? A serious fall or if you hit your head (even if there is no bleeding). ° °Some medicines may interact with Xarelto® and might increase your risk of bleeding while on Xarelto®. To help avoid this, consult your healthcare provider or pharmacist prior to using any new prescription or non-prescription medications, including herbals, vitamins, non-steroidal anti-inflammatory drugs (NSAIDs) and supplements. ° °This website has more information on Xarelto®: www.xarelto.com. ° ° °

## 2016-01-17 NOTE — Progress Notes (Signed)
Physical Therapy Treatment Patient Details Name: Rose Rose MRN: [id number] DOB: 1955/11/19 Today's Date: 01/17/2016    History of Present Illness s/p LTKA , and history of R foot surgery and R TKA few years ago.     PT Comments    Continuing to progress well with therapy. Increased pain this session. Ready to d/c from PT standpoint.   Follow Up Recommendations  Home health PT     Equipment Recommendations  None recommended by PT    Recommendations for Other Services       Precautions / Restrictions Precautions Precautions: Knee Required Braces or Orthoses: Knee Immobilizer - Left (KI dc'd-able to SLR 01/17/16) Knee Immobilizer - Left: Discontinue once straight leg raise with < 10 degree lag Restrictions Weight Bearing Restrictions: No LLE Weight Bearing: Weight bearing as tolerated    Mobility  Bed Mobility Overal bed mobility: Needs Assistance Bed Mobility: Sit to Supine       Sit to supine: Supervision   General bed mobility comments: OOB in recliner  Transfers Overall transfer level: Needs assistance Equipment used: Rolling walker (2 wheeled) Transfers: Sit to/from Stand Sit to Stand: Supervision         General transfer comment: for safety. VCs hand placement.   Ambulation/Gait Ambulation/Gait assistance: Supervision Ambulation Distance (Feet): 75 Feet Assistive device: Rolling walker (2 wheeled) Gait Pattern/deviations: Step-to pattern     General Gait Details: slow steady gait   Stairs Stairs: Yes Stairs assistance: Min guard Stair Management: Backwards;Step to pattern;With walker Number of Stairs: 1 General stair comments: close guard for safety. VCs safety, sequence, technique.   Wheelchair Mobility    Modified Rankin (Stroke Patients Only)       Balance                                    Cognition Arousal/Alertness: Awake/alert Behavior During Therapy: WFL for tasks assessed/performed Overall Cognitive Status:  Within Functional Limits for tasks assessed                      Exercises Total Joint Exercises Ankle Circles/Pumps: AROM;Both;10 reps;Supine Quad Sets: AROM;Both;10 reps;Supine Heel Slides: AAROM;Left;10 reps;Supine Hip ABduction/ADduction: AROM;Left;10 reps;Supine Straight Leg Raises: AROM;Left;10 reps;Supine Goniometric ROM: ~5-70 degrees    General Comments        Pertinent Vitals/Pain Pain Assessment: 0-10 Pain Score: 7  Pain Location: L knee Pain Descriptors / Indicators: Sore Pain Intervention(s): Monitored during session;Ice applied;Repositioned    Home Living                      Prior Function            PT Goals (current goals can now be found in the care plan section) Progress towards PT goals: Progressing toward goals    Frequency  7X/week    PT Plan Current plan remains appropriate    Co-evaluation             End of Session Equipment Utilized During Treatment: Gait belt Activity Tolerance: Patient tolerated treatment well Patient left: in bed;with call bell/phone within reach;with family/visitor present     Time: 1100-1113 PT Time Calculation (min) (ACUTE ONLY): 13 min  Charges:  $Gait Training: 8-22 mins $Therapeutic Exercise: 8-22 mins                    G Codes:  Weston Anna, MPT Pager: [phone]

## 2016-01-17 NOTE — Progress Notes (Signed)
Physical Therapy Treatment Patient Details Name: Rose Rose MRN: [id number] DOB: 08/28/1956 Today's Date: 01/17/2016    History of Present Illness s/p LTKA , and history of R foot surgery and R TKA few years ago.     PT Comments    Progressing well with therapy. Per MD instructions, will have a 2nd session prior to d/c today.   Follow Up Recommendations  Home health PT     Equipment Recommendations  None recommended by PT    Recommendations for Other Services       Precautions / Restrictions Precautions Precautions: Knee Required Braces or Orthoses: Knee Immobilizer - Left (KI dc'd-able to SLR 01/17/16) Knee Immobilizer - Left: Discontinue once straight leg raise with < 10 degree lag Restrictions Weight Bearing Restrictions: No LLE Weight Bearing: Weight bearing as tolerated    Mobility  Bed Mobility   Bed Mobility: Supine to Sit;Sit to Supine     Supine to sit: Supervision     General bed mobility comments: OOB in recliner  Transfers Overall transfer level: Needs assistance Equipment used: Rolling walker (2 wheeled) Transfers: Sit to/from Stand Sit to Stand: Supervision         General transfer comment: for safety. VCs hand placement.   Ambulation/Gait Ambulation/Gait assistance: Supervision Ambulation Distance (Feet): 125 Feet Assistive device: Rolling walker (2 wheeled) Gait Pattern/deviations: Step-to pattern     General Gait Details: slow steady gait   Stairs Stairs: Yes Stairs assistance: Min guard Stair Management: Backwards;Step to pattern;With walker Number of Stairs: 1 General stair comments: close guard for safety. VCs safety, sequence, technique.   Wheelchair Mobility    Modified Rankin (Stroke Patients Only)       Balance                                    Cognition Arousal/Alertness: Awake/alert Behavior During Therapy: WFL for tasks assessed/performed Overall Cognitive Status: Within Functional Limits for  tasks assessed                      Exercises Total Joint Exercises Ankle Circles/Pumps: AROM;Both;10 reps;Supine Quad Sets: AROM;Both;10 reps;Supine Heel Slides: AAROM;Left;10 reps;Supine Hip ABduction/ADduction: AROM;Left;10 reps;Supine Straight Leg Raises: AROM;Left;10 reps;Supine Goniometric ROM: ~5-70 degrees    General Comments        Pertinent Vitals/Pain Pain Assessment: 0-10 Pain Score: 5  Pain Location: L knee Pain Descriptors / Indicators: Sore Pain Intervention(s): Monitored during session;Ice applied;Repositioned    Home Living Family/patient expects to be discharged to:: Private residence Living Arrangements: Spouse/significant other Available Help at Discharge: Family         Home Equipment: Grab bars - toilet;Shower seat      Prior Function Level of Independence: Independent          PT Goals (current goals can now be found in the care plan section) Acute Rehab PT Goals Patient Stated Goal: I want to get home to my cat ASAP  Progress towards PT goals: Progressing toward goals    Frequency  7X/week    PT Plan Current plan remains appropriate    Co-evaluation             End of Session Equipment Utilized During Treatment: Gait belt Activity Tolerance: Patient tolerated treatment well Patient left: in chair;with call bell/phone within reach;with chair alarm set     Time: [id number] PT Time Calculation (min) (ACUTE ONLY): 30 min  Charges:  $  Gait Training: 8-22 mins $Therapeutic Exercise: 8-22 mins                    G Codes:      Weston Anna, MPT Pager: [phone]

## 2016-01-17 NOTE — Evaluation (Signed)
Occupational Therapy Evaluation Patient Details Name: Rose Rose MRN: [id number] DOB: Feb 09, 1956 Today's Date: 01/17/2016    History of Present Illness s/p LTKA , and history of R foot surgery and R TKA few years ago.    Clinical Impression   This 60 year old female was admitted for the above surgery. All education was completed and safety reinforced.  Pt does not need any further OT at this time.    Follow Up Recommendations  Supervision/Assistance - 24 hour;No OT follow up    Equipment Recommendations  None recommended by OT    Recommendations for Other Services       Precautions / Restrictions Precautions Precautions: Knee Knee Immobilizer - Left: On when out of bed or walking;Discontinue once straight leg raise with < 10 degree lag Restrictions Weight Bearing Restrictions: No      Mobility Bed Mobility   Bed Mobility: Supine to Sit;Sit to Supine     Supine to sit: Supervision     General bed mobility comments: HOB raised  Transfers   Equipment used: Rolling walker (2 wheeled) Transfers: Sit to/from Stand Sit to Stand: Supervision         General transfer comment: cues for UE/LE placement    Balance                                            ADL Overall ADL's : Needs assistance/impaired     Grooming: Supervision/safety;Standing   Upper Body Bathing: Supervision/ safety;Standing   Lower Body Bathing: Minimal assistance;Sit to/from stand   Upper Body Dressing : Minimal assistance;Standing   Lower Body Dressing: Moderate assistance;Sit to/from stand   Toilet Transfer: Min guard;Ambulation;Comfort height toilet;RW   Toileting- Water quality scientist and Hygiene: Min guard;Sit to/from stand         General ADL Comments: performed ADL standing at sink. Cued for hand placement when standing and also for safety with RW as pt tended to push walker to side and step outside of legs when performing peri care.  Reviewed shower  sequence and removing KI once seated on shower chair then reapplying for transfer out of shower.  Pt was not unsteady during OT.  Discussed sidestepping through tight spaces     Vision     Perception     Praxis      Pertinent Vitals/Pain Pain Score: 2  Pain Location: L knee Pain Descriptors / Indicators: Sore Pain Intervention(s): Limited activity within patient's tolerance;Monitored during session;Premedicated before session;Repositioned (did not want ice back on)     Hand Dominance     Extremity/Trunk Assessment Upper Extremity Assessment Upper Extremity Assessment: Overall WFL for tasks assessed           Communication Communication Communication: No difficulties   Cognition Arousal/Alertness: Awake/alert Behavior During Therapy: WFL for tasks assessed/performed Overall Cognitive Status: Within Functional Limits for tasks assessed                     General Comments       Exercises       Shoulder Instructions      Home Living Family/patient expects to be discharged to:: Private residence Living Arrangements: Spouse/significant other Available Help at Discharge: Family               Bathroom Shower/Tub: Walk-in Corporate treasurer Toilet: Handicapped height     Home Equipment:  Grab bars - toilet;Shower seat          Prior Functioning/Environment Level of Independence: Independent             OT Diagnosis: Acute pain   OT Problem List:     OT Treatment/Interventions:      OT Goals(Current goals can be found in the care plan section) Acute Rehab OT Goals Patient Stated Goal: I want to get home to my cat ASAP   OT Frequency:     Barriers to D/C:            Co-evaluation              End of Session CPM Left Knee CPM Left Knee: Off  Activity Tolerance: Patient tolerated treatment well Patient left: in chair;with call bell/phone within reach;with chair alarm set   Time: [phone] OT Time Calculation (min): 24  min Charges:  OT General Charges $OT Visit: 1 Procedure OT Evaluation $OT Eval Low Complexity: 1 Procedure OT Treatments $Self Care/Home Management : 8-22 mins G-Codes:    Abdulloh Ullom Feb 11, 2016, 9:04 AM  Lesle Chris, OTR/L [phone] 02-11-2016

## 2016-01-17 NOTE — Discharge Summary (Signed)
Physician Discharge Summary   Patient ID: Rose Rose MRN: [id number] DOB/AGE: [birth year] 59 y.o.  Admit date: 01/16/2016 Discharge date: 01-17-2016  Primary Diagnosis:  Osteoarthritis Left knee(s) Admission Diagnoses:  Past Medical History  Diagnosis Date  . Hyperlipidemia   . Hypertension   . Arthritis   . Depression   . Insomnia   . Complication of anesthesia   . PONV (postoperative nausea and vomiting)   . Pneumonia    Discharge Diagnoses:   Principal Problem:   OA (osteoarthritis) of knee  Estimated body mass index is 34.95 kg/(m^2) as calculated from the following:   Height as of this encounter: 5' 0.5" (1.537 m).   Weight as of this encounter: 82.555 kg (182 lb).  Procedure:  Procedure(s) (LRB): TOTAL LEFT KNEE ARTHROPLASTY (Left)   Consults: None  HPI: Rose Rose is a 60 y.o. year old female with end stage OA of her left knee with progressively worsening pain and dysfunction. She has constant pain, with activity and at rest and significant functional deficits with difficulties even with ADLs. She has had extensive non-op management including analgesics, injections of cortisone, and home exercise program, but remains in significant pain with significant dysfunction. Radiographs show bone on bone arthritis medial and patellofemoral. She presents now for left Total Knee Arthroplasty.  Laboratory Data: Admission on 01/16/2016  Component Date Value Ref Range Status  . WBC 01/17/2016 12.2* 4.0 - 10.5 K/uL Final  . RBC 01/17/2016 4.82  3.87 - 5.11 MIL/uL Final  . Hemoglobin 01/17/2016 13.7  12.0 - 15.0 g/dL Final  . HCT 01/17/2016 41.3  36.0 - 46.0 % Final  . MCV 01/17/2016 85.7  78.0 - 100.0 fL Final  . MCH 01/17/2016 28.4  26.0 - 34.0 pg Final  . MCHC 01/17/2016 33.2  30.0 - 36.0 g/dL Final  . RDW 01/17/2016 14.9  11.5 - 15.5 % Final  . Platelets 01/17/2016 272  150 - 400 K/uL Final  . Sodium 01/17/2016 139  135 - 145 mmol/L Final  . Potassium 01/17/2016 3.8  3.5 -  5.1 mmol/L Final  . Chloride 01/17/2016 105  101 - 111 mmol/L Final  . CO2 01/17/2016 25  22 - 32 mmol/L Final  . Glucose, Bld 01/17/2016 113* 65 - 99 mg/dL Final  . BUN 01/17/2016 13  6 - 20 mg/dL Final  . Creatinine, Ser 01/17/2016 0.54  0.44 - 1.00 mg/dL Final  . Calcium 01/17/2016 9.1  8.9 - 10.3 mg/dL Final  . GFR calc non Af Amer 01/17/2016 >60  >60 mL/min Final  . GFR calc Af Amer 01/17/2016 >60  >60 mL/min Final   Comment: (NOTE) The eGFR has been calculated using the CKD EPI equation. This calculation has not been validated in all clinical situations. eGFR's persistently <60 mL/min signify possible Chronic Kidney Disease.   Georgiann Hahn gap 01/17/2016 9  5 - 15 Final  Hospital Outpatient Visit on 01/06/2016  Component Date Value Ref Range Status  . MRSA, PCR 01/06/2016 POSITIVE* NEGATIVE Final   AFTER HOURS  . Staphylococcus aureus 01/06/2016 POSITIVE* NEGATIVE Final   Comment:        The Xpert SA Assay (FDA approved for NASAL specimens in patients over 14 years of age), is one component of a comprehensive surveillance program.  Test performance has been validated by [facility] for patients greater than or equal to 60 year old. It is not intended to diagnose infection nor to guide or monitor treatment. AFTER HOURS   . aPTT 01/06/2016 32  24 - 37 seconds Final  . WBC 01/06/2016 10.3  4.0 - 10.5 K/uL Final  . RBC 01/06/2016 5.08  3.87 - 5.11 MIL/uL Final  . Hemoglobin 01/06/2016 14.3  12.0 - 15.0 g/dL Final  . HCT 01/06/2016 42.2  36.0 - 46.0 % Final  . MCV 01/06/2016 83.1  78.0 - 100.0 fL Final  . MCH 01/06/2016 28.1  26.0 - 34.0 pg Final  . MCHC 01/06/2016 33.9  30.0 - 36.0 g/dL Final  . RDW 01/06/2016 14.5  11.5 - 15.5 % Final  . Platelets 01/06/2016 273  150 - 400 K/uL Final  . Sodium 01/06/2016 138  135 - 145 mmol/L Final  . Potassium 01/06/2016 3.6  3.5 - 5.1 mmol/L Final  . Chloride 01/06/2016 102  101 - 111 mmol/L Final  . CO2 01/06/2016 26  22 - 32 mmol/L  Final  . Glucose, Bld 01/06/2016 104* 65 - 99 mg/dL Final  . BUN 01/06/2016 13  6 - 20 mg/dL Final  . Creatinine, Ser 01/06/2016 0.63  0.44 - 1.00 mg/dL Final  . Calcium 01/06/2016 9.5  8.9 - 10.3 mg/dL Final  . Total Protein 01/06/2016 7.3  6.5 - 8.1 g/dL Final  . Albumin 01/06/2016 4.3  3.5 - 5.0 g/dL Final  . AST 01/06/2016 25  15 - 41 U/L Final  . ALT 01/06/2016 21  14 - 54 U/L Final  . Alkaline Phosphatase 01/06/2016 82  38 - 126 U/L Final  . Total Bilirubin 01/06/2016 0.4  0.3 - 1.2 mg/dL Final  . GFR calc non Af Amer 01/06/2016 >60  >60 mL/min Final  . GFR calc Af Amer 01/06/2016 >60  >60 mL/min Final   Comment: (NOTE) The eGFR has been calculated using the CKD EPI equation. This calculation has not been validated in all clinical situations. eGFR's persistently <60 mL/min signify possible Chronic Kidney Disease.   . Anion gap 01/06/2016 10  5 - 15 Final  . Prothrombin Time 01/06/2016 13.5  11.6 - 15.2 seconds Final  . INR 01/06/2016 1.01  0.00 - 1.49 Final  . ABO/RH(D) 01/06/2016 O POS   Final  . Antibody Screen 01/06/2016 NEG   Final  . Sample Expiration 01/06/2016 01/19/2016   Final  . Extend sample reason 01/06/2016 NO TRANSFUSIONS OR PREGNANCY IN THE PAST 3 MONTHS   Final  . Color, Urine 01/06/2016 YELLOW  YELLOW Final  . APPearance 01/06/2016 CLOUDY* CLEAR Final  . Specific Gravity, Urine 01/06/2016 1.008  1.005 - 1.030 Final  . pH 01/06/2016 7.0  5.0 - 8.0 Final  . Glucose, UA 01/06/2016 NEGATIVE  NEGATIVE mg/dL Final  . Hgb urine dipstick 01/06/2016 TRACE* NEGATIVE Final  . Bilirubin Urine 01/06/2016 NEGATIVE  NEGATIVE Final  . Ketones, ur 01/06/2016 NEGATIVE  NEGATIVE mg/dL Final  . Protein, ur 01/06/2016 NEGATIVE  NEGATIVE mg/dL Final  . Nitrite 01/06/2016 NEGATIVE  NEGATIVE Final  . Leukocytes, UA 01/06/2016 TRACE* NEGATIVE Final  . Squamous Epithelial / LPF 01/06/2016 6-30* NONE SEEN Final  . WBC, UA 01/06/2016 0-5  0 - 5 WBC/hpf Final  . RBC / HPF 01/06/2016  0-5  0 - 5 RBC/hpf Final  . Bacteria, UA 01/06/2016 RARE* NONE SEEN Final     X-Rays:No results found.  EKG: Orders placed or performed in visit on 12/26/15  . EKG 12-Lead     Hospital Course: Allsion Nogales is a 60 y.o. who was admitted to [facility]. They were brought to the operating room on 01/16/2016 and underwent Procedure(s):  TOTAL LEFT KNEE ARTHROPLASTY.  Patient tolerated the procedure well and was later transferred to the recovery room and then to the orthopaedic floor for postoperative care.  They were given PO and IV analgesics for pain control following their surgery.  They were given 24 hours of postoperative antibiotics of  Anti-infectives    Start     Dose/Rate Route Frequency Ordered Stop   01/16/16 2000  vancomycin (VANCOCIN) IVPB 1000 mg/200 mL premix     1,000 mg 200 mL/hr over 60 Minutes Intravenous Every 12 hours 01/16/16 1113 01/16/16 2036   01/16/16 0557  vancomycin (VANCOCIN) IVPB 1000 mg/200 mL premix     1,000 mg 200 mL/hr over 60 Minutes Intravenous On call to O.R. 01/16/16 9179 01/16/16 0845     and started on DVT prophylaxis in the form of Xarelto.   PT and OT were ordered for total joint protocol.  Discharge planning consulted to help with postop disposition and equipment needs.  Patient had a good night on the evening of surgery.  She walked about 80 feet the day of surgery.  They started to get up OOB again with therapy on day one. Hemovac drain was pulled without difficulty.  Dressing was checked on day one and was clean and dry. Patient was seen in rounds by Dr. Wynelle Link and was felt to be ready to go home that day following surgery.   Discharge home with home health Diet - Cardiac diet Follow up - in 2 weeks on Tuesday 01/31/2016 Activity - WBAT Disposition - Home Condition Upon Discharge - Good D/C Meds - See DC Summary DVT Prophylaxis - Xarelto  Discharge Instructions    Call MD / Call 911    Complete by:  As directed   If you experience  chest pain or shortness of breath, CALL 911 and be transported to the hospital emergency room.  If you develope a fever above 101 F, pus (white drainage) or increased drainage or redness at the wound, or calf pain, call your surgeon's office.     Change dressing    Complete by:  As directed   Change dressing daily with sterile 4 x 4 inch gauze dressing and apply TED hose. Do not submerge the incision under water.     Constipation Prevention    Complete by:  As directed   Drink plenty of fluids.  Prune juice may be helpful.  You may use a stool softener, such as Colace (over the counter) 100 mg twice a day.  Use MiraLax (over the counter) for constipation as needed.     Diet - low sodium heart healthy    Complete by:  As directed      Discharge instructions    Complete by:  As directed   Pick up stool softner and laxative for home use following surgery while on pain medications. Do not submerge incision under water. May remove the surgical dressing tomorrow, Wednesday 01/18/2016, and then apply a dry gauze dressing daily. Please use good hand washing techniques while changing dressing each day. May shower starting three days after surgery starting Thursday 01/19/2016. Please use a clean towel to pat the incision dry following showers. Continue to use ice for pain and swelling after surgery. Do not use any lotions or creams on the incision until instructed by your surgeon.  Postoperative Constipation Protocol  Constipation - defined medically as fewer than three stools per week and severe constipation as less than one stool per week.  One of the  most common issues patients have following surgery is constipation. Even if you have a regular bowel pattern at home, your normal regimen is likely to be disrupted due to multiple reasons following surgery. Combination of anesthesia, postoperative narcotics, change in appetite and fluid intake all can affect your bowels. In order to avoid complications  following surgery, here are some recommendations in order to help you during your recovery period.  Colace (docusate) - Pick up an over-the-counter form of Colace or another stool softener and take twice a day as long as you are requiring postoperative pain medications. Take with a full glass of water daily. If you experience loose stools or diarrhea, hold the colace until you stool forms back up. If your symptoms do not get better within 1 week or if they get worse, check with your doctor.  Dulcolax (bisacodyl) - Pick up over-the-counter and take as directed by the product packaging as needed to assist with the movement of your bowels. Take with a full glass of water. Use this product as needed if not relieved by Colace only.   MiraLax (polyethylene glycol) - Pick up over-the-counter to have on hand. MiraLax is a solution that will increase the amount of water in your bowels to assist with bowel movements. Take as directed and can mix with a glass of water, juice, soda, coffee, or tea. Take if you go more than two days without a movement. Do not use MiraLax more than once per day. Call your doctor if you are still constipated or irregular after using this medication for 7 days in a row.  If you continue to have problems with postoperative constipation, please contact the office for further assistance and recommendations. If you experience "the worst abdominal pain ever" or develop nausea or vomiting, please contact the office immediatly for further recommendations for treatment.  Take Xarelto for two and a half more weeks, then discontinue Xarelto. Once the patient has completed the blood thinner regimen, then take a Baby 81 mg Aspirin daily for three more weeks.     Do not put a pillow under the knee. Place it under the heel.    Complete by:  As directed      Do not sit on low chairs, stoools or toilet seats, as it may be difficult to get up from low surfaces    Complete by:  As directed        Driving restrictions    Complete by:  As directed   No driving until released by the physician.     Increase activity slowly as tolerated    Complete by:  As directed      Lifting restrictions    Complete by:  As directed   No lifting until released by the physician.     Patient may shower    Complete by:  As directed   You may shower without a dressing once there is no drainage.  Do not wash over the wound.  If drainage remains, do not shower until drainage stops.     TED hose    Complete by:  As directed   Use stockings (TED hose) for 3 weeks on both leg(s).  You may remove them at night for sleeping.     Weight bearing as tolerated    Complete by:  As directed   Laterality:  left  Extremity:  Lower            Medication List    STOP taking these medications  multivitamin capsule     OVER THE COUNTER MEDICATION     OVER THE COUNTER MEDICATION     tiZANidine 4 MG tablet  Commonly known as:  ZANAFLEX      TAKE these medications        citalopram 20 MG tablet  Commonly known as:  CELEXA  TAKE 1 TABLET TWICE A DAY     diazepam 2 MG tablet  Commonly known as:  VALIUM  Take 1 tablet (2 mg total) by mouth every 12 (twelve) hours as needed for anxiety.     fluticasone 50 MCG/ACT nasal spray  Commonly known as:  FLONASE  Place 2 sprays into both nostrils daily as needed for allergies or rhinitis.     hydrochlorothiazide 25 MG tablet  Commonly known as:  HYDRODIURIL  TAKE 1/2 TO 1 TABLET (=12.5TO 25MG) DAILY     HYDROmorphone 2 MG tablet  Commonly known as:  DILAUDID  Take 1-2 tablets (2-4 mg total) by mouth every 4 (four) hours as needed for moderate pain or severe pain.     methocarbamol 500 MG tablet  Commonly known as:  ROBAXIN  Take 1 tablet (500 mg total) by mouth every 6 (six) hours as needed for muscle spasms.     olopatadine 0.1 % ophthalmic solution  Commonly known as:  PATANOL  Place 1 drop into both eyes daily as needed for allergies.      rivaroxaban 10 MG Tabs tablet  Commonly known as:  XARELTO  Take 1 tablet (10 mg total) by mouth daily with breakfast. Take Xarelto for two and a half more weeks, then discontinue Xarelto. Once the patient has completed the blood thinner regimen, then take a Baby 81 mg Aspirin daily for three more weeks.     simvastatin 20 MG tablet  Commonly known as:  ZOCOR  TAKE 1 TABLET DAILY     Tapentadol HCl 75 MG Tabs  Commonly known as:  NUCYNTA  Take 1 tablet (75 mg total) by mouth 2 (two) times daily.     traMADol 50 MG tablet  Commonly known as:  ULTRAM  Take 1 tablet (50 mg total) by mouth every 8 (eight) hours as needed. TAKE 1 TABLET EVERY 6 HOURSAS NEEDED FOR PAIN     valACYclovir 500 MG tablet  Commonly known as:  VALTREX  TAKE 1 TABLET DAILY AS     NEEDED           Follow-up Information    Follow up with Gearlean Alf, MD. Schedule an appointment as soon as possible for a visit on 01/31/2016.   Specialty:  Orthopedic Surgery   Why:  Call office at [phone] to setup appt on Tuesday 4/11 with Dr. Wynelle Link.   Contact information:   [address] [place] [address] [phone]       Signed: Arlee Muslim, PA-C Orthopaedic Surgery 01/17/2016, 9:00 AM

## 2016-01-23 DIAGNOSIS — Z96653 Presence of artificial knee joint, bilateral: Secondary | ICD-10-CM | POA: Diagnosis not present

## 2016-01-23 DIAGNOSIS — Z6831 Body mass index (BMI) 31.0-31.9, adult: Secondary | ICD-10-CM | POA: Diagnosis not present

## 2016-01-23 DIAGNOSIS — F4322 Adjustment disorder with anxiety: Secondary | ICD-10-CM | POA: Diagnosis not present

## 2016-01-23 DIAGNOSIS — E669 Obesity, unspecified: Secondary | ICD-10-CM | POA: Diagnosis not present

## 2016-01-23 DIAGNOSIS — Z471 Aftercare following joint replacement surgery: Secondary | ICD-10-CM | POA: Diagnosis not present

## 2016-01-23 DIAGNOSIS — I739 Peripheral vascular disease, unspecified: Secondary | ICD-10-CM | POA: Diagnosis not present

## 2016-01-23 DIAGNOSIS — E785 Hyperlipidemia, unspecified: Secondary | ICD-10-CM | POA: Diagnosis not present

## 2016-01-23 DIAGNOSIS — Z7901 Long term (current) use of anticoagulants: Secondary | ICD-10-CM | POA: Diagnosis not present

## 2016-01-23 DIAGNOSIS — M1990 Unspecified osteoarthritis, unspecified site: Secondary | ICD-10-CM | POA: Diagnosis not present

## 2016-01-23 DIAGNOSIS — I10 Essential (primary) hypertension: Secondary | ICD-10-CM | POA: Diagnosis not present

## 2016-01-23 DIAGNOSIS — Z79891 Long term (current) use of opiate analgesic: Secondary | ICD-10-CM | POA: Diagnosis not present

## 2016-01-23 DIAGNOSIS — Z87891 Personal history of nicotine dependence: Secondary | ICD-10-CM | POA: Diagnosis not present

## 2016-01-25 DIAGNOSIS — M1990 Unspecified osteoarthritis, unspecified site: Secondary | ICD-10-CM | POA: Diagnosis not present

## 2016-01-25 DIAGNOSIS — Z471 Aftercare following joint replacement surgery: Secondary | ICD-10-CM | POA: Diagnosis not present

## 2016-01-25 DIAGNOSIS — Z96653 Presence of artificial knee joint, bilateral: Secondary | ICD-10-CM | POA: Diagnosis not present

## 2016-01-25 DIAGNOSIS — Z7901 Long term (current) use of anticoagulants: Secondary | ICD-10-CM | POA: Diagnosis not present

## 2016-01-25 DIAGNOSIS — Z87891 Personal history of nicotine dependence: Secondary | ICD-10-CM | POA: Diagnosis not present

## 2016-01-25 DIAGNOSIS — Z6831 Body mass index (BMI) 31.0-31.9, adult: Secondary | ICD-10-CM | POA: Diagnosis not present

## 2016-01-25 DIAGNOSIS — Z79891 Long term (current) use of opiate analgesic: Secondary | ICD-10-CM | POA: Diagnosis not present

## 2016-01-25 DIAGNOSIS — F4322 Adjustment disorder with anxiety: Secondary | ICD-10-CM | POA: Diagnosis not present

## 2016-01-25 DIAGNOSIS — E669 Obesity, unspecified: Secondary | ICD-10-CM | POA: Diagnosis not present

## 2016-01-25 DIAGNOSIS — I739 Peripheral vascular disease, unspecified: Secondary | ICD-10-CM | POA: Diagnosis not present

## 2016-01-25 DIAGNOSIS — E785 Hyperlipidemia, unspecified: Secondary | ICD-10-CM | POA: Diagnosis not present

## 2016-01-25 DIAGNOSIS — I10 Essential (primary) hypertension: Secondary | ICD-10-CM | POA: Diagnosis not present

## 2016-01-27 DIAGNOSIS — Z471 Aftercare following joint replacement surgery: Secondary | ICD-10-CM | POA: Diagnosis not present

## 2016-01-27 DIAGNOSIS — Z6831 Body mass index (BMI) 31.0-31.9, adult: Secondary | ICD-10-CM | POA: Diagnosis not present

## 2016-01-27 DIAGNOSIS — I739 Peripheral vascular disease, unspecified: Secondary | ICD-10-CM | POA: Diagnosis not present

## 2016-01-27 DIAGNOSIS — E785 Hyperlipidemia, unspecified: Secondary | ICD-10-CM | POA: Diagnosis not present

## 2016-01-27 DIAGNOSIS — I10 Essential (primary) hypertension: Secondary | ICD-10-CM | POA: Diagnosis not present

## 2016-01-27 DIAGNOSIS — Z96653 Presence of artificial knee joint, bilateral: Secondary | ICD-10-CM | POA: Diagnosis not present

## 2016-01-27 DIAGNOSIS — F4322 Adjustment disorder with anxiety: Secondary | ICD-10-CM | POA: Diagnosis not present

## 2016-01-27 DIAGNOSIS — E669 Obesity, unspecified: Secondary | ICD-10-CM | POA: Diagnosis not present

## 2016-01-27 DIAGNOSIS — Z87891 Personal history of nicotine dependence: Secondary | ICD-10-CM | POA: Diagnosis not present

## 2016-01-27 DIAGNOSIS — M1990 Unspecified osteoarthritis, unspecified site: Secondary | ICD-10-CM | POA: Diagnosis not present

## 2016-01-27 DIAGNOSIS — Z7901 Long term (current) use of anticoagulants: Secondary | ICD-10-CM | POA: Diagnosis not present

## 2016-01-27 DIAGNOSIS — Z79891 Long term (current) use of opiate analgesic: Secondary | ICD-10-CM | POA: Diagnosis not present

## 2016-02-01 DIAGNOSIS — M25562 Pain in left knee: Secondary | ICD-10-CM | POA: Diagnosis not present

## 2016-02-06 DIAGNOSIS — M25562 Pain in left knee: Secondary | ICD-10-CM | POA: Diagnosis not present

## 2016-02-08 ENCOUNTER — Other Ambulatory Visit: Payer: Self-pay | Admitting: Adult Health

## 2016-02-08 DIAGNOSIS — M25562 Pain in left knee: Secondary | ICD-10-CM | POA: Diagnosis not present

## 2016-02-10 DIAGNOSIS — M25562 Pain in left knee: Secondary | ICD-10-CM | POA: Diagnosis not present

## 2016-02-13 DIAGNOSIS — M25562 Pain in left knee: Secondary | ICD-10-CM | POA: Diagnosis not present

## 2016-02-15 DIAGNOSIS — M25562 Pain in left knee: Secondary | ICD-10-CM | POA: Diagnosis not present

## 2016-02-17 DIAGNOSIS — M25562 Pain in left knee: Secondary | ICD-10-CM | POA: Diagnosis not present

## 2016-02-20 DIAGNOSIS — M25562 Pain in left knee: Secondary | ICD-10-CM | POA: Diagnosis not present

## 2016-02-21 DIAGNOSIS — Z96652 Presence of left artificial knee joint: Secondary | ICD-10-CM | POA: Diagnosis not present

## 2016-02-21 DIAGNOSIS — Z471 Aftercare following joint replacement surgery: Secondary | ICD-10-CM | POA: Diagnosis not present

## 2016-02-22 DIAGNOSIS — M25562 Pain in left knee: Secondary | ICD-10-CM | POA: Diagnosis not present

## 2016-02-24 DIAGNOSIS — M25562 Pain in left knee: Secondary | ICD-10-CM | POA: Diagnosis not present

## 2016-02-27 DIAGNOSIS — M25562 Pain in left knee: Secondary | ICD-10-CM | POA: Diagnosis not present

## 2016-03-02 DIAGNOSIS — M25562 Pain in left knee: Secondary | ICD-10-CM | POA: Diagnosis not present

## 2016-03-12 ENCOUNTER — Telehealth: Payer: Self-pay | Admitting: Adult Health

## 2016-03-12 NOTE — Telephone Encounter (Signed)
Last seen on 12/26/2015. Okay for order?

## 2016-03-12 NOTE — Telephone Encounter (Signed)
Pt would like to come in for a hep c test. Is that OK?

## 2016-03-13 NOTE — Telephone Encounter (Signed)
I called and notified patient - verbalized understanding. Patient states that she is going to wait and schedule a CPE in September and just wait until then to have Hep C Screening. Thanks!

## 2016-03-13 NOTE — Telephone Encounter (Signed)
That is fine or she can wait till her next blood draw

## 2016-03-15 ENCOUNTER — Ambulatory Visit (INDEPENDENT_AMBULATORY_CARE_PROVIDER_SITE_OTHER): Payer: BLUE CROSS/BLUE SHIELD | Admitting: Adult Health

## 2016-03-15 ENCOUNTER — Encounter: Payer: Self-pay | Admitting: Adult Health

## 2016-03-15 VITALS — BP 128/78 | Temp 98.0°F | Ht 60.5 in | Wt 176.2 lb

## 2016-03-15 DIAGNOSIS — H6691 Otitis media, unspecified, right ear: Secondary | ICD-10-CM | POA: Diagnosis not present

## 2016-03-15 NOTE — Progress Notes (Signed)
   Subjective:    Patient ID: Rose Rose, female    DOB: 1956/05/17, 60 y.o.   MRN: [id number]  HPI  60 year old female who presents to the office today for the complaint of right ear discomfort. She has had this discomfort for about one week. Per patient " it feels like I have fluid behind my ear." She denies any fevers, ear drainage, hedaches, sinus pain or pressure.    Review of Systems  Constitutional: Negative.   HENT: Positive for ear pain. Negative for ear discharge, hearing loss, nosebleeds, postnasal drip, rhinorrhea, sinus pressure and sore throat.   Respiratory: Negative.   Cardiovascular: Negative.   Gastrointestinal: Negative.   Genitourinary: Negative.   Musculoskeletal: Negative.   Neurological: Negative.   All other systems reviewed and are negative.      Objective:   Physical Exam  Constitutional: She is oriented to person, place, and time. She appears well-developed and well-nourished. No distress.  HENT:  Head: Normocephalic and atraumatic.  Right Ear: External ear normal.  Left Ear: External ear normal.  Nose: Nose normal.  Mouth/Throat: Oropharynx is clear and moist. No oropharyngeal exudate.  Trace fluid behind right TM. No signs of infection in either ear canal   Eyes: Conjunctivae and EOM are normal. Pupils are equal, round, and reactive to light. Right eye exhibits no discharge. Left eye exhibits no discharge. No scleral icterus.  Neck: Normal range of motion. Neck supple.  Lymphadenopathy:    She has no cervical adenopathy.  Neurological: She is alert and oriented to person, place, and time.  Skin: Skin is warm and dry. No rash noted. She is not diaphoretic. No erythema. No pallor.  Psychiatric: She has a normal mood and affect. Her behavior is normal. Judgment and thought content normal.  Nursing note and vitals reviewed.      Assessment & Plan:  1. Otitis media with coexisting condition requiring alternate treatment, right - No signs of  infection. I do not think she needs a antibiotic at this time - Flonase, sudafed, or OTC allergy medication.  - Follow up if no improvement in the next 2-3 days  Dorothyann Peng, NP

## 2016-04-18 ENCOUNTER — Other Ambulatory Visit: Payer: Self-pay | Admitting: Family

## 2016-04-19 ENCOUNTER — Other Ambulatory Visit: Payer: Self-pay | Admitting: Adult Health

## 2016-04-19 MED ORDER — CITALOPRAM HYDROBROMIDE 20 MG PO TABS: 20.0000 mg | ORAL_TABLET | Freq: Two times a day (BID) | ORAL | Status: DC

## 2016-04-19 NOTE — Telephone Encounter (Signed)
Can we refill this? 

## 2016-04-23 ENCOUNTER — Other Ambulatory Visit: Payer: Self-pay | Admitting: Adult Health

## 2016-04-23 ENCOUNTER — Other Ambulatory Visit: Payer: Self-pay | Admitting: Family

## 2016-04-26 ENCOUNTER — Telehealth: Payer: Self-pay | Admitting: *Deleted

## 2016-04-26 ENCOUNTER — Other Ambulatory Visit: Payer: Self-pay

## 2016-04-26 MED ORDER — TRAMADOL HCL 50 MG PO TABS: 50.0000 mg | ORAL_TABLET | Freq: Three times a day (TID) | ORAL | Status: DC | PRN

## 2016-04-26 NOTE — Telephone Encounter (Signed)
[facility] sent request for Tramadol HCL Tab 50mg . Take q 8hrs prn. #180 with 1 refill.

## 2016-04-26 NOTE — Telephone Encounter (Signed)
Ok to refill, 90 pills, 0 refills

## 2016-04-26 NOTE — Telephone Encounter (Signed)
Ok to refill 

## 2016-04-26 NOTE — Telephone Encounter (Signed)
Printed, signed and faxed.  

## 2016-04-27 ENCOUNTER — Telehealth: Payer: Self-pay | Admitting: *Deleted

## 2016-04-27 DIAGNOSIS — Z760 Encounter for issue of repeat prescription: Secondary | ICD-10-CM

## 2016-04-27 MED ORDER — DIAZEPAM 2 MG PO TABS: 2.0000 mg | ORAL_TABLET | Freq: Two times a day (BID) | ORAL | Status: DC | PRN

## 2016-04-27 NOTE — Addendum Note (Signed)
Addended by: Elio Forget on: 04/27/2016 01:04 PM   Modules accepted: Orders

## 2016-04-27 NOTE — Telephone Encounter (Signed)
Can you print this out under dr. Elease Hashimoto

## 2016-04-27 NOTE — Telephone Encounter (Signed)
Printed, signed and gave to Alda.

## 2016-04-27 NOTE — Telephone Encounter (Signed)
Ok to refill 

## 2016-04-27 NOTE — Telephone Encounter (Signed)
Rx faxed in to pharmacy.

## 2016-04-27 NOTE — Telephone Encounter (Signed)
[facility] requesting Valium Tab 2 mg. Take 1 q12 hrs prn anxiety #180 Last ordered on 12-09-15 for #180 Patient last seen o 03-15-16

## 2016-05-01 ENCOUNTER — Telehealth: Payer: Self-pay | Admitting: Adult Health

## 2016-05-01 NOTE — Telephone Encounter (Signed)
[place] would like directions for Rx tramadol (Ultram)  Reference [id number]

## 2016-05-01 NOTE — Telephone Encounter (Signed)
Gave pharmacist directions for Tramadol. Thanks.

## 2016-05-03 DIAGNOSIS — Z471 Aftercare following joint replacement surgery: Secondary | ICD-10-CM | POA: Diagnosis not present

## 2016-05-03 DIAGNOSIS — Z96652 Presence of left artificial knee joint: Secondary | ICD-10-CM | POA: Diagnosis not present

## 2016-05-14 ENCOUNTER — Telehealth: Payer: Self-pay | Admitting: Adult Health

## 2016-05-14 MED ORDER — HYDROCHLOROTHIAZIDE 25 MG PO TABS: ORAL_TABLET | ORAL | 1 refills | Status: DC

## 2016-05-14 NOTE — Telephone Encounter (Signed)
Pt needs refill hctz 25 mg #90 w/refills send to [facility]

## 2016-05-31 NOTE — Telephone Encounter (Signed)
Rose Rose please call this encounter for me

## 2016-06-05 ENCOUNTER — Ambulatory Visit (INDEPENDENT_AMBULATORY_CARE_PROVIDER_SITE_OTHER): Payer: BLUE CROSS/BLUE SHIELD | Admitting: Adult Health

## 2016-06-05 ENCOUNTER — Encounter: Payer: Self-pay | Admitting: Adult Health

## 2016-06-05 VITALS — BP 126/80 | Temp 97.9°F | Ht 60.5 in | Wt 174.4 lb

## 2016-06-05 DIAGNOSIS — B359 Dermatophytosis, unspecified: Secondary | ICD-10-CM | POA: Diagnosis not present

## 2016-06-05 NOTE — Patient Instructions (Addendum)
It was great seeing you again   The wound appears as it may be ring worm. Try hydrocortisone cream and lamisil.   Follow up if no improvement or if it gets worse   Body Ringworm Ringworm (tinea corporis) is a fungal infection of the skin on the body. This infection is not caused by worms, but is actually caused by a fungus. Fungus normally lives on the top of your skin and can be useful. However, in the case of ringworms, the fungus grows out of control and causes a skin infection. It can involve any area of skin on the body and can spread easily from one person to another (contagious). Ringworm is a common problem for children, but it can affect adults as well. Ringworm is also often found in athletes, especially wrestlers who share equipment and mats.  CAUSES  Ringworm of the body is caused by a fungus called dermatophyte. It can spread by:  Touchingother people who are infected.  Touchinginfected pets.  Touching or sharingobjects that have been in contact with the infected person or pet (hats, combs, towels, clothing, sports equipment). SYMPTOMS   Itchy, raised red spots and bumps on the skin.  Ring-shaped rash.  Redness near the border of the rash with a clear center.  Dry and scaly skin on or around the rash. Not every person develops a ring-shaped rash. Some develop only the red, scaly patches. DIAGNOSIS  Most often, ringworm can be diagnosed by performing a skin exam. Your caregiver may choose to take a skin scraping from the affected area. The sample will be examined under the microscope to see if the fungus is present.  TREATMENT  Body ringworm may be treated with a topical antifungal cream or ointment. Sometimes, an antifungal shampoo that can be used on your body is prescribed. You may be prescribed antifungal medicines to take by mouth if your ringworm is severe, keeps coming back, or lasts a long time.  HOME CARE INSTRUCTIONS   Only take over-the-counter or  prescription medicines as directed by your caregiver.  Wash the infected area and dry it completely before applying yourcream or ointment.  When using antifungal shampoo to treat the ringworm, leave the shampoo on the body for 3-5 minutes before rinsing.   Wear loose clothing to stop clothes from rubbing and irritating the rash.  Wash or change your bed sheets every night while you have the rash.  Have your pet treated by your veterinarian if it has the same infection. To prevent ringworm:   Practice good hygiene.  Wear sandals or shoes in public places and showers.  Do not share personal items with others.  Avoid touching red patches of skin on other people.  Avoid touching pets that have bald spots or wash your hands after doing so. SEEK MEDICAL CARE IF:   Your rash continues to spread after 7 days of treatment.  Your rash is not gone in 4 weeks.  The area around your rash becomes red, warm, tender, and swollen.   This information is not intended to replace advice given to you by your health care provider. Make sure you discuss any questions you have with your health care provider.   Document Released: 10/05/2000 Document Revised: 07/02/2012 Document Reviewed: 04/21/2012 Elsevier Interactive Patient Education [employer].

## 2016-06-05 NOTE — Progress Notes (Signed)
Subjective:    Patient ID: Rose Rose, female    DOB: 1955-11-18, 60 y.o.   MRN: [id number]  HPI  60 year old female who presents to the clinic today for a " bump on my right breast". She reports that 4 days ago she first noticed what she thought was a " mosquito bite" and it has grown in size over the past 24 hours.   She denies any drainage but does have itching.   Has not noticed any masses   Review of Systems  Constitutional: Negative.   HENT: Negative.   Respiratory: Negative.   Cardiovascular: Negative.   Skin: Positive for color change and wound. Negative for pallor and rash.  All other systems reviewed and are negative.  Past Medical History:  Diagnosis Date  . Arthritis   . Complication of anesthesia   . Depression   . Hyperlipidemia   . Hypertension   . Insomnia   . Pneumonia   . PONV (postoperative nausea and vomiting)     Social History   Social History  . Marital status: Married    Spouse name: N/A  . Number of children: N/A  . Years of education: N/A   Occupational History  . Not on file.   Social History Main Topics  . Smoking status: Former Smoker    Quit date: 10/22/2002  . Smokeless tobacco: Never Used  . Alcohol use No  . Drug use:     Types: Marijuana  . Sexual activity: Not on file   Other Topics Concern  . Not on file   Social History Narrative   Works at [employer] - works with [employer].    Married for 21 years   No children        Past Surgical History:  Procedure Laterality Date  . MYRINGOTOMY     Right ear  . TONSILLECTOMY    . 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 Maureen Ralphs  . TOTAL KNEE ARTHROPLASTY Left 01/16/2016   Procedure: TOTAL LEFT KNEE ARTHROPLASTY;  Surgeon: Gaynelle Arabian, MD;  Location: [facility];  Service: Orthopedics;  Laterality: Left;    Family History  Problem Relation Age of Onset  . Alzheimer's disease Maternal Aunt   . Arthritis Mother   . Vision loss Mother   . Dementia Father     . Parkinsonism Father     Allergies  Allergen Reactions  . Amoxicillin-Pot Clavulanate     REACTION: swelling  Has patient had a PCN reaction causing immediate rash, facial/tongue/throat swelling, SOB or lightheadedness with hypotension: unk Has patient had a PCN reaction causing severe rash involving mucus membranes or skin necrosis: no Has patient had a PCN reaction that required hospitalization: unk Has patient had a PCN reaction occurring within the last 10 years: yes If all of the above answers are "NO", then may proceed with Cephalosporin use.   . Augmentin [Amoxicillin-Pot Clavulanate] Itching and Swelling  . Oxycodone Hcl Nausea And Vomiting    Current Outpatient Prescriptions on File Prior to Visit  Medication Sig Dispense Refill  . citalopram (CELEXA) 20 MG tablet Take 1 tablet (20 mg total) by mouth 2 (two) times daily. 180 tablet 1  . diazepam (VALIUM) 2 MG tablet Take 1 tablet (2 mg total) by mouth every 12 (twelve) hours as needed for anxiety. 180 tablet 0  . fluticasone (FLONASE) 50 MCG/ACT nasal spray Place 2 sprays into both nostrils daily as needed for allergies or rhinitis.     Marland Kitchen  hydrochlorothiazide (HYDRODIURIL) 25 MG tablet TAKE 1/2 TO 1 TABLET (=12.5TO 25MG ) DAILY 90 tablet 1  . olopatadine (PATANOL) 0.1 % ophthalmic solution Place 1 drop into both eyes daily as needed for allergies.     . simvastatin (ZOCOR) 20 MG tablet TAKE 1 TABLET DAILY (Patient taking differently: TAKE one half TABLET (10 mg) daily at bedtime) 90 tablet 0  . Tapentadol HCl (NUCYNTA) 75 MG TABS Take 1 tablet (75 mg total) by mouth 2 (two) times daily. (Patient taking differently: Take 75 mg by mouth 2 (two) times daily as needed. ) 60 tablet 0  . traMADol (ULTRAM) 50 MG tablet Take 1 tablet (50 mg total) by mouth every 8 (eight) hours as needed. TAKE 1 TABLET EVERY 6 HOURSAS NEEDED FOR PAIN 90 tablet 0  . valACYclovir (VALTREX) 500 MG tablet TAKE 1 TABLET DAILY AS     NEEDED (Patient taking  differently: TAKE 1 TABLET DAILY AS  NEEDED for fever blisters) 90 tablet 0   No current facility-administered medications on file prior to visit.     BP 126/80   Temp 97.9 F (36.6 C) (Oral)   Ht 5' 0.5" (1.537 m)   Wt 174 lb 6.4 oz (79.1 kg)   BMI 33.50 kg/m       Objective:   Physical Exam  Constitutional: She is oriented to person, place, and time. She appears well-developed and well-nourished. No distress.  Genitourinary:  Genitourinary Comments: No masses, dimpling or discharge   Musculoskeletal: Normal range of motion.  Neurological: She is alert and oriented to person, place, and time.  Skin: Skin is warm and dry. No rash noted. She is not diaphoretic. No erythema. No pallor.  What appears as ring worm. About the size of a dime on apex of right breast.     Psychiatric: She has a normal mood and affect. Her behavior is normal. Judgment and thought content normal.  Nursing note and vitals reviewed.     Assessment & Plan:  1. Tinea - Advised hydrocortisone cream and Lamisil  - Follow up if no resolution in two weeks or if symptoms worsen   Dorothyann Peng, NP

## 2016-06-28 DIAGNOSIS — Z471 Aftercare following joint replacement surgery: Secondary | ICD-10-CM | POA: Diagnosis not present

## 2016-06-28 DIAGNOSIS — Z96652 Presence of left artificial knee joint: Secondary | ICD-10-CM | POA: Diagnosis not present

## 2016-07-06 ENCOUNTER — Other Ambulatory Visit: Payer: Self-pay | Admitting: Adult Health

## 2016-07-18 ENCOUNTER — Other Ambulatory Visit: Payer: Self-pay | Admitting: Adult Health

## 2016-07-18 ENCOUNTER — Ambulatory Visit (INDEPENDENT_AMBULATORY_CARE_PROVIDER_SITE_OTHER): Payer: BLUE CROSS/BLUE SHIELD | Admitting: Adult Health

## 2016-07-18 ENCOUNTER — Encounter: Payer: Self-pay | Admitting: Adult Health

## 2016-07-18 ENCOUNTER — Telehealth: Payer: Self-pay | Admitting: Adult Health

## 2016-07-18 VITALS — BP 140/62

## 2016-07-18 DIAGNOSIS — R42 Dizziness and giddiness: Secondary | ICD-10-CM | POA: Diagnosis not present

## 2016-07-18 MED ORDER — ONDANSETRON HCL 4 MG PO TABS: 4.0000 mg | ORAL_TABLET | Freq: Three times a day (TID) | ORAL | 1 refills | Status: DC | PRN

## 2016-07-18 MED ORDER — ONDANSETRON HCL 4 MG/2ML IJ SOLN
4.0000 mg | Freq: Once | INTRAMUSCULAR | Status: AC
  Administered 2016-07-18: 4 mg via INTRAMUSCULAR

## 2016-07-18 MED ORDER — MECLIZINE HCL 25 MG PO TABS: 25.0000 mg | ORAL_TABLET | Freq: Three times a day (TID) | ORAL | 0 refills | Status: DC | PRN

## 2016-07-18 MED ORDER — MECLIZINE HCL 32 MG PO TABS: 32.0000 mg | ORAL_TABLET | Freq: Three times a day (TID) | ORAL | 1 refills | Status: DC | PRN

## 2016-07-18 NOTE — Patient Instructions (Addendum)
It was great seeing you today and I am sorry you are feeling so horrible.   I believe that your nausea and vomiting is coming from vertigo.   I have sent in a prescription for Zofran and Meclizine. The zofran is for nausea and meclizine is for dizziness.   Take as prescribed and as needed  Start with clear liquids and advance diet as tolerated  Vertigo Vertigo means you feel like you or your surroundings are moving when they are not. Vertigo can be dangerous if it occurs when you are at work, driving, or performing difficult activities.  CAUSES  Vertigo occurs when there is a conflict of signals sent to your brain from the visual and sensory systems in your body. There are many different causes of vertigo, including:  Infections, especially in the inner ear.  A bad reaction to a drug or misuse of alcohol and medicines.  Withdrawal from drugs or alcohol.  Rapidly changing positions, such as lying down or rolling over in bed.  A migraine headache.  Decreased blood flow to the brain.  Increased pressure in the brain from a head injury, infection, tumor, or bleeding. SYMPTOMS  You may feel as though the world is spinning around or you are falling to the ground. Because your balance is upset, vertigo can cause nausea and vomiting. You may have involuntary eye movements (nystagmus). DIAGNOSIS  Vertigo is usually diagnosed by physical exam. If the cause of your vertigo is unknown, your caregiver may perform imaging tests, such as an MRI scan (magnetic resonance imaging). TREATMENT  Most cases of vertigo resolve on their own, without treatment. Depending on the cause, your caregiver may prescribe certain medicines. If your vertigo is related to body position issues, your caregiver may recommend movements or procedures to correct the problem. In rare cases, if your vertigo is caused by certain inner ear problems, you may need surgery. HOME CARE INSTRUCTIONS   Follow your caregiver's  instructions.  Avoid driving.  Avoid operating heavy machinery.  Avoid performing any tasks that would be dangerous to you or others during a vertigo episode.  Tell your caregiver if you notice that certain medicines seem to be causing your vertigo. Some of the medicines used to treat vertigo episodes can actually make them worse in some people. SEEK IMMEDIATE MEDICAL CARE IF:   Your medicines do not relieve your vertigo or are making it worse.  You develop problems with talking, walking, weakness, or using your arms, hands, or legs.  You develop severe headaches.  Your nausea or vomiting continues or gets worse.  You develop visual changes.  A family member notices behavioral changes.  Your condition gets worse. MAKE SURE YOU:  Understand these instructions.  Will watch your condition.  Will get help right away if you are not doing well or get worse.   This information is not intended to replace advice given to you by your health care provider. Make sure you discuss any questions you have with your health care provider.   Document Released: 07/18/2005 Document Revised: 12/31/2011 Document Reviewed: 01/31/2015 Elsevier Interactive Patient Education [employer].

## 2016-07-18 NOTE — Progress Notes (Signed)
Subjective:    Patient ID: Rose Rose, female    DOB: 1956/01/26, 60 y.o.   MRN: [id number]  HPI  60 year old female who  has a past medical history of Arthritis; Complication of anesthesia; Depression; Hyperlipidemia; Hypertension; Insomnia; Pneumonia; and PONV (postoperative nausea and vomiting).  presents to the office today for the acute complaint of dizziness, nausea and vomiting. She reports that for the last 3 days she has had the constant sensation of " the room is spinning", which is worse when changing positions. This causes her to become nauseated and then start vomiting. She has been unable to keep any food or liquids down for the last two days.   She does endorse photophobia.   Denies any fevers, headache, or feeling acutely ill. She does not feel as though the N/V is stemming from anything else besides the dizziness.   Review of Systems  Constitutional: Positive for activity change, appetite change and fatigue. Negative for chills, diaphoresis, fever and unexpected weight change.  HENT: Negative.   Eyes: Positive for photophobia. Negative for pain, redness and visual disturbance.  Respiratory: Negative.   Cardiovascular: Negative.   Gastrointestinal: Positive for nausea and vomiting. Negative for abdominal distention, abdominal pain, constipation and diarrhea.  Genitourinary: Negative.   Neurological: Positive for dizziness.  Hematological: Negative.   All other systems reviewed and are negative.  Past Medical History:  Diagnosis Date  . Arthritis   . Complication of anesthesia   . Depression   . Hyperlipidemia   . Hypertension   . Insomnia   . Pneumonia   . PONV (postoperative nausea and vomiting)     Social History   Social History  . Marital status: Married    Spouse name: N/A  . Number of children: N/A  . Years of education: N/A   Occupational History  . Not on file.   Social History Main Topics  . Smoking status: Former Smoker    Quit date:  10/22/2002  . Smokeless tobacco: Never Used  . Alcohol use No  . Drug use:     Types: Marijuana  . Sexual activity: Not on file   Other Topics Concern  . Not on file   Social History Narrative   Works at [employer] - works with [employer].    Married for 21 years   No children        Past Surgical History:  Procedure Laterality Date  . MYRINGOTOMY     Right ear  . TONSILLECTOMY    . 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 Maureen Ralphs  . TOTAL KNEE ARTHROPLASTY Left 01/16/2016   Procedure: TOTAL LEFT KNEE ARTHROPLASTY;  Surgeon: Gaynelle Arabian, MD;  Location: [facility];  Service: Orthopedics;  Laterality: Left;    Family History  Problem Relation Age of Onset  . Alzheimer's disease Maternal Aunt   . Arthritis Mother   . Vision loss Mother   . Dementia Father   . Parkinsonism Father     Allergies  Allergen Reactions  . Amoxicillin-Pot Clavulanate     REACTION: swelling  Has patient had a PCN reaction causing immediate rash, facial/tongue/throat swelling, SOB or lightheadedness with hypotension: unk Has patient had a PCN reaction causing severe rash involving mucus membranes or skin necrosis: no Has patient had a PCN reaction that required hospitalization: unk Has patient had a PCN reaction occurring within the last 10 years: yes If all of the above answers are "NO", then may proceed with Cephalosporin  use.   . Augmentin [Amoxicillin-Pot Clavulanate] Itching and Swelling  . Oxycodone Hcl Nausea And Vomiting    Current Outpatient Prescriptions on File Prior to Visit  Medication Sig Dispense Refill  . citalopram (CELEXA) 20 MG tablet Take 1 tablet (20 mg total) by mouth 2 (two) times daily. 180 tablet 1  . diazepam (VALIUM) 2 MG tablet Take 1 tablet (2 mg total) by mouth every 12 (twelve) hours as needed for anxiety. 180 tablet 0  . fluticasone (FLONASE) 50 MCG/ACT nasal spray Place 2 sprays into both nostrils daily as needed for allergies or rhinitis.     .  hydrochlorothiazide (HYDRODIURIL) 25 MG tablet TAKE 1/2 TO 1 TABLET (=12.5TO 25MG ) DAILY 90 tablet 1  . olopatadine (PATANOL) 0.1 % ophthalmic solution Place 1 drop into both eyes daily as needed for allergies.     . simvastatin (ZOCOR) 20 MG tablet TAKE 1 TABLET DAILY (Patient taking differently: TAKE one half TABLET (10 mg) daily at bedtime) 90 tablet 0  . Tapentadol HCl (NUCYNTA) 75 MG TABS Take 1 tablet (75 mg total) by mouth 2 (two) times daily. (Patient taking differently: Take 75 mg by mouth 2 (two) times daily as needed. ) 60 tablet 0  . traMADol (ULTRAM) 50 MG tablet Take 1 tablet (50 mg total) by mouth every 8 (eight) hours as needed. TAKE 1 TABLET EVERY 6 HOURSAS NEEDED FOR PAIN 90 tablet 0  . valACYclovir (VALTREX) 500 MG tablet TAKE 1 TABLET DAILY AS     NEEDED (Patient taking differently: TAKE 1 TABLET DAILY AS  NEEDED for fever blisters) 90 tablet 0   No current facility-administered medications on file prior to visit.     BP 140/62       Objective:   Physical Exam  Constitutional: She is oriented to person, place, and time. She appears well-developed and well-nourished. No distress.  HENT:  Head: Normocephalic and atraumatic.  Right Ear: External ear normal.  Left Ear: External ear normal.  Nose: Nose normal.  Mouth/Throat: Oropharynx is clear and moist. No oropharyngeal exudate.  No signs of otitis media  Eyes: Conjunctivae are normal. Right eye exhibits no discharge. Right eye exhibits nystagmus. Left eye exhibits nystagmus.  Nystagmus L>R  Neck: Normal range of motion.  Cardiovascular: Normal rate, regular rhythm, normal heart sounds and intact distal pulses.  Exam reveals no gallop and no friction rub.   No murmur heard. Pulmonary/Chest: Effort normal and breath sounds normal. No respiratory distress. She has no wheezes. She has no rales. She exhibits no tenderness.  Abdominal: Soft. Bowel sounds are normal. She exhibits no distension and no mass. There is no  tenderness. There is no rebound and no guarding.  Lymphadenopathy:    She has no cervical adenopathy.  Neurological: She is alert and oriented to person, place, and time.  Skin: Skin is warm and dry. No rash noted. She is not diaphoretic. No erythema. No pallor.  Psychiatric: She has a normal mood and affect. Her behavior is normal. Judgment and thought content normal.  Nursing note and vitals reviewed.     Assessment & Plan:  1. Vertigo - patient vomiting in waiting room. Unable to tilt her back on exam table due to symptoms.  - Appears as though her symptoms are stemming from vertigo.  - ondansetron (ZOFRAN) injection 4 mg; Inject 2 mLs (4 mg total) into the muscle once. - ondansetron (ZOFRAN) 4 MG tablet; Take 1 tablet (4 mg total) by mouth every 8 (eight) hours as  needed for nausea or vomiting.  Dispense: 20 tablet; Refill: 1 - meclizine (ANTIVERT) 32 MG tablet; Take 1 tablet (32 mg total) by mouth 3 (three) times daily as needed.  Dispense: 30 tablet; Refill: 1 - Clear liquid diet and advance as tolerated.  - Follow up if no improvement in the next 24 hours.   Dorothyann Peng, NP

## 2016-07-18 NOTE — Telephone Encounter (Signed)
Pt called to let you know that the pharmacy state that meclizine does not come in a 32mg  strength and the pharmacy would like to know what you would like to do.

## 2016-07-18 NOTE — Telephone Encounter (Signed)
Please advise 

## 2016-07-24 ENCOUNTER — Ambulatory Visit (INDEPENDENT_AMBULATORY_CARE_PROVIDER_SITE_OTHER): Payer: BLUE CROSS/BLUE SHIELD | Admitting: Adult Health

## 2016-07-24 VITALS — BP 156/90 | HR 63 | Temp 98.4°F | Resp 20 | Ht 60.5 in | Wt 172.5 lb

## 2016-07-24 DIAGNOSIS — Z760 Encounter for issue of repeat prescription: Secondary | ICD-10-CM | POA: Diagnosis not present

## 2016-07-24 DIAGNOSIS — H8113 Benign paroxysmal vertigo, bilateral: Secondary | ICD-10-CM | POA: Diagnosis not present

## 2016-07-24 MED ORDER — TRAMADOL HCL 50 MG PO TABS: 50.0000 mg | ORAL_TABLET | Freq: Three times a day (TID) | ORAL | 0 refills | Status: DC | PRN

## 2016-07-24 NOTE — Progress Notes (Signed)
Pre visit review using our clinic review tool, if applicable. No additional management support is needed unless otherwise documented below in the visit note. 

## 2016-07-24 NOTE — Progress Notes (Signed)
Subjective:    Patient ID: Rose Rose, female    DOB: 1956/01/14, 60 y.o.   MRN: [id number]  HPI  60 year old female who presents for the follow up regarding vertigo. She was seen in the office last week for vertigo. She reports today that she continues to feel " off balance" and is having to walk with a cane. She does states " I feel 900% better than when I last saw you, and every day I am feeling a little bit better."   She is taking Meclinze and has noticed that it works well for her  Review of Systems  Constitutional: Negative.   HENT: Negative.   Eyes: Negative.   Respiratory: Negative.   Cardiovascular: Negative.   Musculoskeletal: Negative.   Neurological: Negative.   Hematological: Negative.   All other systems reviewed and are negative.  Past Medical History:  Diagnosis Date  . Arthritis   . Complication of anesthesia   . Depression   . Hyperlipidemia   . Hypertension   . Insomnia   . Pneumonia   . PONV (postoperative nausea and vomiting)     Social History   Social History  . Marital status: Married    Spouse name: N/A  . Number of children: N/A  . Years of education: N/A   Occupational History  . Not on file.   Social History Main Topics  . Smoking status: Former Smoker    Quit date: 10/22/2002  . Smokeless tobacco: Never Used  . Alcohol use No  . Drug use:     Types: Marijuana  . Sexual activity: Not on file   Other Topics Concern  . Not on file   Social History Narrative   Works at [employer] - works with [employer].    Married for 21 years   No children        Past Surgical History:  Procedure Laterality Date  . MYRINGOTOMY     Right ear  . TONSILLECTOMY    . 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 Maureen Ralphs  . TOTAL KNEE ARTHROPLASTY Left 01/16/2016   Procedure: TOTAL LEFT KNEE ARTHROPLASTY;  Surgeon: Gaynelle Arabian, MD;  Location: [facility];  Service: Orthopedics;  Laterality: Left;    Family History  Problem Relation  Age of Onset  . Alzheimer's disease Maternal Aunt   . Arthritis Mother   . Vision loss Mother   . Dementia Father   . Parkinsonism Father     Allergies  Allergen Reactions  . Amoxicillin-Pot Clavulanate     REACTION: swelling  Has patient had a PCN reaction causing immediate rash, facial/tongue/throat swelling, SOB or lightheadedness with hypotension: unk Has patient had a PCN reaction causing severe rash involving mucus membranes or skin necrosis: no Has patient had a PCN reaction that required hospitalization: unk Has patient had a PCN reaction occurring within the last 10 years: yes If all of the above answers are "NO", then may proceed with Cephalosporin use.   . Augmentin [Amoxicillin-Pot Clavulanate] Itching and Swelling  . Oxycodone Hcl Nausea And Vomiting    Current Outpatient Prescriptions on File Prior to Visit  Medication Sig Dispense Refill  . citalopram (CELEXA) 20 MG tablet Take 1 tablet (20 mg total) by mouth 2 (two) times daily. 180 tablet 1  . diazepam (VALIUM) 2 MG tablet Take 1 tablet (2 mg total) by mouth every 12 (twelve) hours as needed for anxiety. 180 tablet 0  . fluticasone (FLONASE) 50 MCG/ACT  nasal spray Place 2 sprays into both nostrils daily as needed for allergies or rhinitis.     . hydrochlorothiazide (HYDRODIURIL) 25 MG tablet TAKE 1/2 TO 1 TABLET (=12.5TO 25MG ) DAILY 90 tablet 1  . meclizine (ANTIVERT) 25 MG tablet Take 1 tablet (25 mg total) by mouth 3 (three) times daily as needed for dizziness. 30 tablet 0  . olopatadine (PATANOL) 0.1 % ophthalmic solution Place 1 drop into both eyes daily as needed for allergies.     Marland Kitchen ondansetron (ZOFRAN) 4 MG tablet Take 1 tablet (4 mg total) by mouth every 8 (eight) hours as needed for nausea or vomiting. 20 tablet 1  . simvastatin (ZOCOR) 20 MG tablet TAKE 1 TABLET DAILY (Patient taking differently: TAKE one half TABLET (10 mg) daily at bedtime) 90 tablet 0  . Tapentadol HCl (NUCYNTA) 75 MG TABS Take 1 tablet  (75 mg total) by mouth 2 (two) times daily. (Patient taking differently: Take 75 mg by mouth 2 (two) times daily as needed. ) 60 tablet 0  . valACYclovir (VALTREX) 500 MG tablet TAKE 1 TABLET DAILY AS     NEEDED (Patient taking differently: TAKE 1 TABLET DAILY AS  NEEDED for fever blisters) 90 tablet 0   No current facility-administered medications on file prior to visit.     BP (!) 156/90 (BP Location: Left Arm, Patient Position: Sitting, Cuff Size: Normal)   Pulse 63   Temp 98.4 F (36.9 C) (Oral)   Resp 20   Ht 5' 0.5" (1.537 m)   Wt 172 lb 8 oz (78.2 kg)   SpO2 98%   BMI 33.13 kg/m       Objective:   Physical Exam  Constitutional: She is oriented to person, place, and time. She appears well-developed and well-nourished. No distress.  Eyes: Conjunctivae are normal. Pupils are equal, round, and reactive to light. Right eye exhibits no discharge. Left eye exhibits no discharge. No scleral icterus.  Nystagmus L >R  Cardiovascular: Normal rate, regular rhythm, normal heart sounds and intact distal pulses.  Exam reveals no gallop and no friction rub.   No murmur heard. Pulmonary/Chest: Effort normal and breath sounds normal. No respiratory distress. She has no wheezes. She has no rales. She exhibits no tenderness.  Neurological: She is alert and oriented to person, place, and time.  Skin: Skin is warm and dry. No rash noted. She is not diaphoretic. No erythema. No pallor.  Psychiatric: She has a normal mood and affect. Her behavior is normal. Judgment and thought content normal.  Nursing note and vitals reviewed.      Assessment & Plan:  1. Benign paroxysmal positional vertigo due to bilateral vestibular disorder - Increase Meclizine to 50 mg - PT vestibular rehab; Future - Home [facility] maneuver paperwork given  - Follow up if no improvement   2. Medication refill  - traMADol (ULTRAM) 50 MG tablet; Take 1 tablet (50 mg total) by mouth every 8 (eight) hours as needed. TAKE 1  TABLET EVERY 6 HOURSAS NEEDED FOR PAIN  Dispense: 30 tablet; Refill: 0  Dorothyann Peng, NP

## 2016-07-27 ENCOUNTER — Other Ambulatory Visit: Payer: Self-pay | Admitting: Adult Health

## 2016-07-27 MED ORDER — MECLIZINE HCL 25 MG PO TABS: 25.0000 mg | ORAL_TABLET | Freq: Four times a day (QID) | ORAL | 0 refills | Status: DC | PRN

## 2016-07-27 NOTE — Telephone Encounter (Signed)
Sig changed and sent to the pharmacy by e-scribe.

## 2016-07-27 NOTE — Telephone Encounter (Signed)
Assessment & Plan:  1. Benign paroxysmal positional vertigo due to bilateral vestibular disorder - Increase Meclizine to 50 mg - PT vestibular rehab; Future - Home Eply maneuver paperwork given  - Follow up if no improvement  Dr. Mamie Nick, please help refill this for [facility]. Last Filled on 07/18/16 #30

## 2016-07-27 NOTE — Telephone Encounter (Signed)
This patient is on  Cns depressants  Caution with these meds  Can rx  antivert   25 mg    Maximum 4 per day... 1 po q 6 hours  If needed .for vertigo  Can rx 30 until CN gets back to office.

## 2016-07-27 NOTE — Telephone Encounter (Signed)
Pt need new Rx for meclizine   Pharm:  [facility]

## 2016-08-01 ENCOUNTER — Other Ambulatory Visit: Payer: Self-pay

## 2016-08-01 ENCOUNTER — Telehealth: Payer: Self-pay | Admitting: Adult Health

## 2016-08-01 DIAGNOSIS — Z760 Encounter for issue of repeat prescription: Secondary | ICD-10-CM

## 2016-08-01 MED ORDER — TRAMADOL HCL 50 MG PO TABS: ORAL_TABLET | ORAL | 0 refills | Status: DC

## 2016-08-01 NOTE — Telephone Encounter (Signed)
Rx has been sent with new instructions. Thanks!

## 2016-08-01 NOTE — Telephone Encounter (Signed)
[facility] needs clarification on the instructions for pt's  traMADol (ULTRAM) 50 MG tablet  Take 1 tablet (50 mg total) by mouth every 8 (eight) hours as needed. TAKE 1 TABLET EVERY 6 HOURSAS NEEDED FOR PAIN        Please call;  [phone]  Ref # [id number]

## 2016-09-06 ENCOUNTER — Other Ambulatory Visit: Payer: Self-pay | Admitting: Adult Health

## 2016-09-06 NOTE — Telephone Encounter (Signed)
Ok to refill 

## 2016-09-18 ENCOUNTER — Encounter: Payer: Self-pay | Admitting: Adult Health

## 2016-09-18 ENCOUNTER — Ambulatory Visit (INDEPENDENT_AMBULATORY_CARE_PROVIDER_SITE_OTHER): Payer: BLUE CROSS/BLUE SHIELD | Admitting: Adult Health

## 2016-09-18 VITALS — BP 138/62 | Temp 98.0°F | Ht 60.5 in | Wt 178.9 lb

## 2016-09-18 DIAGNOSIS — M79605 Pain in left leg: Secondary | ICD-10-CM | POA: Diagnosis not present

## 2016-09-18 MED ORDER — METHYLPREDNISOLONE 4 MG PO TBPK: ORAL_TABLET | ORAL | 0 refills | Status: DC

## 2016-09-18 NOTE — Progress Notes (Signed)
Subjective:    Patient ID: Rose Rose, female    DOB: 1955/12/24, 60 y.o.   MRN: [id number]  HPI  60 year old female who  has a past medical history of Arthritis; Complication of anesthesia; Depression; Hyperlipidemia; Hypertension; Insomnia; Pneumonia; and PONV (postoperative nausea and vomiting). She presents to the office today left leg pain x 6 months. She reports that the pain is worse when laying down in bed. She will also have pain when exercising. The pain is located on the lateral aspect of her left leg, above the knee. Painful with palpation. The pain is described as " aching" and "spasming"  There is no pain in the buttocks or hip.   Her other prescribed pain medications help.   There was no apparent trauma.    Review of Systems  Constitutional: Negative.   Respiratory: Negative.   Cardiovascular: Negative.   Musculoskeletal: Positive for myalgias. Negative for arthralgias and gait problem.  Skin: Negative.   Neurological: Negative.   All other systems reviewed and are negative.  Past Medical History:  Diagnosis Date  . Arthritis   . Complication of anesthesia   . Depression   . Hyperlipidemia   . Hypertension   . Insomnia   . Pneumonia   . PONV (postoperative nausea and vomiting)     Social History   Social History  . Marital status: Married    Spouse name: N/A  . Number of children: N/A  . Years of education: N/A   Occupational History  . Not on file.   Social History Main Topics  . Smoking status: Former Smoker    Quit date: 10/22/2002  . Smokeless tobacco: Never Used  . Alcohol use No  . Drug use:     Types: Marijuana  . Sexual activity: Not on file   Other Topics Concern  . Not on file   Social History Narrative   Works at [employer] - works with [employer].    Married for 21 years   No children        Past Surgical History:  Procedure Laterality Date  . MYRINGOTOMY     Right ear  . TONSILLECTOMY    . TOTAL KNEE  ARTHROPLASTY Right 05/2011   Dr. Maureen Ralphs  . TOTAL KNEE ARTHROPLASTY Left 01/16/2016   Procedure: TOTAL LEFT KNEE ARTHROPLASTY;  Surgeon: Gaynelle Arabian, MD;  Location: [facility];  Service: Orthopedics;  Laterality: Left;    Family History  Problem Relation Age of Onset  . Alzheimer's disease Maternal Aunt   . Arthritis Mother   . Vision loss Mother   . Dementia Father   . Parkinsonism Father     Allergies  Allergen Reactions  . Amoxicillin-Pot Clavulanate     REACTION: swelling  Has patient had a PCN reaction causing immediate rash, facial/tongue/throat swelling, SOB or lightheadedness with hypotension: unk Has patient had a PCN reaction causing severe rash involving mucus membranes or skin necrosis: no Has patient had a PCN reaction that required hospitalization: unk Has patient had a PCN reaction occurring within the last 10 years: yes If all of the above answers are "NO", then may proceed with Cephalosporin use.   . Augmentin [Amoxicillin-Pot Clavulanate] Itching and Swelling  . Oxycodone Hcl Nausea And Vomiting    Current Outpatient Prescriptions on File Prior to Visit  Medication Sig Dispense Refill  . citalopram (CELEXA) 20 MG tablet Take 1 tablet (20 mg total) by mouth 2 (two) times daily. 180 tablet 1  .  diazepam (VALIUM) 2 MG tablet Take 1 tablet (2 mg total) by mouth every 12 (twelve) hours as needed for anxiety. 180 tablet 0  . fluticasone (FLONASE) 50 MCG/ACT nasal spray Place 2 sprays into both nostrils daily as needed for allergies or rhinitis.     . hydrochlorothiazide (HYDRODIURIL) 25 MG tablet TAKE 1/2 TO 1 TABLET (=12.5TO 25MG ) DAILY 90 tablet 1  . meclizine (ANTIVERT) 25 MG tablet Take 1 tablet (25 mg total) by mouth 4 (four) times daily as needed for dizziness. 30 tablet 0  . olopatadine (PATANOL) 0.1 % ophthalmic solution Place 1 drop into both eyes daily as needed for allergies.     Marland Kitchen ondansetron (ZOFRAN) 4 MG tablet Take 1 tablet (4 mg total) by mouth every 8  (eight) hours as needed for nausea or vomiting. 20 tablet 1  . simvastatin (ZOCOR) 20 MG tablet TAKE 1 TABLET DAILY (Patient taking differently: TAKE one half TABLET (10 mg) daily at bedtime) 90 tablet 0  . Tapentadol HCl (NUCYNTA) 75 MG TABS Take 1 tablet (75 mg total) by mouth 2 (two) times daily. (Patient taking differently: Take 75 mg by mouth 2 (two) times daily as needed. ) 60 tablet 0  . tiZANidine (ZANAFLEX) 4 MG tablet TAKE 1 TABLET EVERY 8 HOURSAS NEEDED 270 tablet 1  . traMADol (ULTRAM) 50 MG tablet TAKE 1 TABLET EVERY 8 HOURS AS NEEDED FOR PAIN 30 tablet 0  . valACYclovir (VALTREX) 500 MG tablet TAKE 1 TABLET DAILY AS     NEEDED (Patient taking differently: TAKE 1 TABLET DAILY AS  NEEDED for fever blisters) 90 tablet 0   No current facility-administered medications on file prior to visit.     BP 138/62   Temp 98 F (36.7 C) (Oral)   Ht 5' 0.5" (1.537 m)   Wt 178 lb 14.4 oz (81.1 kg)   BMI 34.36 kg/m       Objective:   Physical Exam  Constitutional: She is oriented to person, place, and time. She appears well-developed and well-nourished.  Musculoskeletal: Normal range of motion. She exhibits tenderness. She exhibits no edema or deformity.  Neurological: She is alert and oriented to person, place, and time.  Skin: Skin is warm and dry. No rash noted. She is not diaphoretic. No erythema. No pallor.  Psychiatric: She has a normal mood and affect. Her behavior is normal. Thought content normal.  Vitals reviewed.     Assessment & Plan:  1. Left leg pain - Likely muscle strain.  - Will try short course of prednisone. Advised against wearing high heels.  - Stretching exercise - methylPREDNISolone (MEDROL DOSEPAK) 4 MG TBPK tablet; Take as directed  Dispense: 21 tablet; Refill: 0 - Consider referral to PT   * Advised that if she would like to continue to her pain medications that she should follow up with pain management. She does not want to follow up with pain  management  Dorothyann Peng, NP

## 2016-10-09 ENCOUNTER — Ambulatory Visit (INDEPENDENT_AMBULATORY_CARE_PROVIDER_SITE_OTHER): Payer: BLUE CROSS/BLUE SHIELD | Admitting: Adult Health

## 2016-10-09 ENCOUNTER — Encounter: Payer: Self-pay | Admitting: Adult Health

## 2016-10-09 VITALS — BP 116/66 | HR 75 | Temp 98.3°F | Ht 60.5 in | Wt 179.0 lb

## 2016-10-09 DIAGNOSIS — J069 Acute upper respiratory infection, unspecified: Secondary | ICD-10-CM

## 2016-10-09 MED ORDER — PREDNISONE 10 MG PO TABS: ORAL_TABLET | ORAL | 0 refills | Status: DC

## 2016-10-09 MED ORDER — DOXYCYCLINE HYCLATE 100 MG PO CAPS: 100.0000 mg | ORAL_CAPSULE | Freq: Two times a day (BID) | ORAL | 0 refills | Status: DC

## 2016-10-09 NOTE — Progress Notes (Signed)
Subjective:    Patient ID: Rose Rose, female    DOB: 12-Apr-1956, 60 y.o.   MRN: [id number]  URI   This is a new problem. The current episode started in the past 7 days. The problem has been gradually worsening. The maximum temperature recorded prior to her arrival was 100.4 - 100.9 F. The fever has been present for 1 to 2 days. Associated symptoms include congestion, coughing (dry ), ear pain, headaches, rhinorrhea, sinus pain and swollen glands. Pertinent negatives include no diarrhea, nausea, plugged ear sensation, sore throat or wheezing. She has tried NSAIDs for the symptoms. The treatment provided no relief.      Review of Systems  HENT: Positive for congestion, ear pain, rhinorrhea, sinus pain and voice change. Negative for postnasal drip, sinus pressure, sore throat, tinnitus and trouble swallowing.   Respiratory: Positive for cough (dry ) and chest tightness. Negative for wheezing.   Cardiovascular: Negative.   Gastrointestinal: Negative.  Negative for diarrhea and nausea.  Neurological: Positive for headaches.   Past Medical History:  Diagnosis Date  . Arthritis   . Complication of anesthesia   . Depression   . Hyperlipidemia   . Hypertension   . Insomnia   . Pneumonia   . PONV (postoperative nausea and vomiting)     Social History   Social History  . Marital status: Married    Spouse name: N/A  . Number of children: N/A  . Years of education: N/A   Occupational History  . Not on file.   Social History Main Topics  . Smoking status: Current Some Day Smoker    Last attempt to quit: 10/22/2002  . Smokeless tobacco: Never Used  . Alcohol use No  . Drug use:     Types: Marijuana  . Sexual activity: Not on file   Other Topics Concern  . Not on file   Social History Narrative   Works at [employer] - works with [employer].    Married for 21 years   No children        Past Surgical History:  Procedure Laterality Date  . MYRINGOTOMY     Right  ear  . TONSILLECTOMY    . 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 Maureen Ralphs  . TOTAL KNEE ARTHROPLASTY Left 01/16/2016   Procedure: TOTAL LEFT KNEE ARTHROPLASTY;  Surgeon: Gaynelle Arabian, MD;  Location: [facility];  Service: Orthopedics;  Laterality: Left;    Family History  Problem Relation Age of Onset  . Alzheimer's disease Maternal Aunt   . Arthritis Mother   . Vision loss Mother   . Dementia Father   . Parkinsonism Father     Allergies  Allergen Reactions  . Amoxicillin-Pot Clavulanate     REACTION: swelling  Has patient had a PCN reaction causing immediate rash, facial/tongue/throat swelling, SOB or lightheadedness with hypotension: unk Has patient had a PCN reaction causing severe rash involving mucus membranes or skin necrosis: no Has patient had a PCN reaction that required hospitalization: unk Has patient had a PCN reaction occurring within the last 10 years: yes If all of the above answers are "NO", then may proceed with Cephalosporin use.   . Augmentin [Amoxicillin-Pot Clavulanate] Itching and Swelling  . Oxycodone Hcl Nausea And Vomiting    Current Outpatient Prescriptions on File Prior to Visit  Medication Sig Dispense Refill  . citalopram (CELEXA) 20 MG tablet Take 1 tablet (20 mg total) by mouth 2 (two) times daily. 180 tablet 1  .  diazepam (VALIUM) 2 MG tablet Take 1 tablet (2 mg total) by mouth every 12 (twelve) hours as needed for anxiety. 180 tablet 0  . fluticasone (FLONASE) 50 MCG/ACT nasal spray Place 2 sprays into both nostrils daily as needed for allergies or rhinitis.     . hydrochlorothiazide (HYDRODIURIL) 25 MG tablet TAKE 1/2 TO 1 TABLET (=12.5TO 25MG ) DAILY 90 tablet 1  . meclizine (ANTIVERT) 25 MG tablet Take 1 tablet (25 mg total) by mouth 4 (four) times daily as needed for dizziness. 30 tablet 0  . methylPREDNISolone (MEDROL DOSEPAK) 4 MG TBPK tablet Take as directed 21 tablet 0  . olopatadine (PATANOL) 0.1 % ophthalmic solution Place 1 drop into  both eyes daily as needed for allergies.     Marland Kitchen ondansetron (ZOFRAN) 4 MG tablet Take 1 tablet (4 mg total) by mouth every 8 (eight) hours as needed for nausea or vomiting. 20 tablet 1  . simvastatin (ZOCOR) 20 MG tablet TAKE 1 TABLET DAILY (Patient taking differently: TAKE one half TABLET (10 mg) daily at bedtime) 90 tablet 0  . Tapentadol HCl (NUCYNTA) 75 MG TABS Take 1 tablet (75 mg total) by mouth 2 (two) times daily. (Patient taking differently: Take 75 mg by mouth 2 (two) times daily as needed. ) 60 tablet 0  . tiZANidine (ZANAFLEX) 4 MG tablet TAKE 1 TABLET EVERY 8 HOURSAS NEEDED 270 tablet 1  . traMADol (ULTRAM) 50 MG tablet TAKE 1 TABLET EVERY 8 HOURS AS NEEDED FOR PAIN 30 tablet 0  . valACYclovir (VALTREX) 500 MG tablet TAKE 1 TABLET DAILY AS     NEEDED (Patient taking differently: TAKE 1 TABLET DAILY AS  NEEDED for fever blisters) 90 tablet 0   No current facility-administered medications on file prior to visit.     BP 116/66 (BP Location: Left Arm, Patient Position: Sitting, Cuff Size: Normal)   Pulse 75   Temp 98.3 F (36.8 C) (Oral)   Ht 5' 0.5" (1.537 m)   Wt 179 lb (81.2 kg)   SpO2 94%   BMI 34.38 kg/m       Objective:   Physical Exam  Constitutional: She is oriented to person, place, and time. She appears well-developed and well-nourished. No distress.  HENT:  Head: Normocephalic and atraumatic.  Right Ear: Hearing, tympanic membrane, external ear and ear canal normal.  Left Ear: Hearing, tympanic membrane, external ear and ear canal normal.  Nose: Mucosal edema and rhinorrhea present. Left sinus exhibits frontal sinus tenderness.  Mouth/Throat: Uvula is midline and oropharynx is clear and moist. No oropharyngeal exudate, posterior oropharyngeal edema, posterior oropharyngeal erythema or tonsillar abscesses.  Eyes: Conjunctivae and EOM are normal. Pupils are equal, round, and reactive to light. Right eye exhibits no discharge. Left eye exhibits no discharge. No scleral  icterus.  Neck: Normal range of motion. Neck supple.  Cardiovascular: Normal rate, regular rhythm, normal heart sounds and intact distal pulses.  Exam reveals no gallop and no friction rub.   No murmur heard. Pulmonary/Chest: Effort normal. No respiratory distress. She has wheezes. She has no rales. She exhibits no tenderness.  Tight sounding lung sounds throughout   Musculoskeletal: Normal range of motion. She exhibits no edema, tenderness or deformity.  Lymphadenopathy:    She has cervical adenopathy.  Neurological: She is alert and oriented to person, place, and time. She has normal reflexes. She displays normal reflexes. No cranial nerve deficit. She exhibits normal muscle tone. Coordination normal.  Skin: Skin is warm and dry. No rash noted.  She is not diaphoretic. No erythema.  Psychiatric: She has a normal mood and affect. Her behavior is normal. Judgment and thought content normal.  Nursing note and vitals reviewed.     Assessment & Plan:  1. Acute upper respiratory infection - predniSONE (DELTASONE) 10 MG tablet; 40 mg x 3 days, 20 mg x 3 days, 10 mg x 3 days  Dispense: 21 tablet; Refill: 0 - doxycycline (VIBRAMYCIN) 100 MG capsule; Take 1 capsule (100 mg total) by mouth 2 (two) times daily.  Dispense: 14 capsule; Refill: 0 - Follow up if no improvement in the next 2-3 days   Dorothyann Peng, NP

## 2016-10-09 NOTE — Progress Notes (Signed)
Pre visit review using our clinic review tool, if applicable. No additional management support is needed unless otherwise documented below in the visit note. 

## 2016-10-19 ENCOUNTER — Other Ambulatory Visit: Payer: Self-pay | Admitting: Adult Health

## 2016-11-06 ENCOUNTER — Ambulatory Visit (INDEPENDENT_AMBULATORY_CARE_PROVIDER_SITE_OTHER): Payer: BLUE CROSS/BLUE SHIELD | Admitting: Adult Health

## 2016-11-06 VITALS — BP 132/64 | Temp 98.2°F | Ht 60.5 in | Wt 176.6 lb

## 2016-11-06 DIAGNOSIS — D492 Neoplasm of unspecified behavior of bone, soft tissue, and skin: Secondary | ICD-10-CM | POA: Diagnosis not present

## 2016-11-06 DIAGNOSIS — D0462 Carcinoma in situ of skin of left upper limb, including shoulder: Secondary | ICD-10-CM | POA: Diagnosis not present

## 2016-11-06 NOTE — Progress Notes (Signed)
Subjective:    Patient ID: Rose Rose, female    DOB: 07-15-1956, 61 y.o.   MRN: [id number]  HPI  T77-year-old female who  has a past medical history of Arthritis; Complication of anesthesia; Depression; Hyperlipidemia; Hypertension; Insomnia; Pneumonia; and PONV (postoperative nausea and vomiting).Presents to the office today for concern of a small area on herarm arm that she's noticed a new growth and endorses pain.he denies any drainage from this area but does endorse localized redness around the growth.   No history of skin cancer in the past.  She is unsure of when she first noticed this area  Review of Systems  Constitutional: Negative.   Skin: Positive for color change and wound. Negative for pallor and rash.  All other systems reviewed and are negative.  Past Medical History:  Diagnosis Date  . Arthritis   . Complication of anesthesia   . Depression   . Hyperlipidemia   . Hypertension   . Insomnia   . Pneumonia   . PONV (postoperative nausea and vomiting)     Social History   Social History  . Marital status: Married    Spouse name: N/A  . Number of children: N/A  . Years of education: N/A   Occupational History  . Not on file.   Social History Main Topics  . Smoking status: Current Some Day Smoker    Last attempt to quit: 10/22/2002  . Smokeless tobacco: Never Used  . Alcohol use No  . Drug use:     Types: Marijuana  . Sexual activity: Not on file   Other Topics Concern  . Not on file   Social History Narrative   Works at [employer] - works with [employer].    Married for 21 years   No children        Past Surgical History:  Procedure Laterality Date  . MYRINGOTOMY     Right ear  . TONSILLECTOMY    . 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 Maureen Ralphs  . TOTAL KNEE ARTHROPLASTY Left 01/16/2016   Procedure: TOTAL LEFT KNEE ARTHROPLASTY;  Surgeon: Gaynelle Arabian, MD;  Location: [facility];  Service: Orthopedics;  Laterality: Left;     Family History  Problem Relation Age of Onset  . Alzheimer's disease Maternal Aunt   . Arthritis Mother   . Vision loss Mother   . Dementia Father   . Parkinsonism Father     Allergies  Allergen Reactions  . Amoxicillin-Pot Clavulanate     REACTION: swelling  Has patient had a PCN reaction causing immediate rash, facial/tongue/throat swelling, SOB or lightheadedness with hypotension: unk Has patient had a PCN reaction causing severe rash involving mucus membranes or skin necrosis: no Has patient had a PCN reaction that required hospitalization: unk Has patient had a PCN reaction occurring within the last 10 years: yes If all of the above answers are "NO", then may proceed with Cephalosporin use.   . Augmentin [Amoxicillin-Pot Clavulanate] Itching and Swelling  . Oxycodone Hcl Nausea And Vomiting    Current Outpatient Prescriptions on File Prior to Visit  Medication Sig Dispense Refill  . citalopram (CELEXA) 20 MG tablet Take 1 tablet (20 mg total) by mouth 2 (two) times daily. 180 tablet 1  . diazepam (VALIUM) 2 MG tablet Take 1 tablet (2 mg total) by mouth every 12 (twelve) hours as needed for anxiety. 180 tablet 0  . doxycycline (VIBRAMYCIN) 100 MG capsule Take 1 capsule (100 mg total) by mouth  2 (two) times daily. 14 capsule 0  . fluticasone (FLONASE) 50 MCG/ACT nasal spray Place 2 sprays into both nostrils daily as needed for allergies or rhinitis.     . hydrochlorothiazide (HYDRODIURIL) 25 MG tablet TAKE 1/2 TO 1 TABLET (=12.5TO 25MG ) DAILY 90 tablet 1  . meclizine (ANTIVERT) 25 MG tablet Take 1 tablet (25 mg total) by mouth 4 (four) times daily as needed for dizziness. 30 tablet 0  . methylPREDNISolone (MEDROL DOSEPAK) 4 MG TBPK tablet Take as directed 21 tablet 0  . olopatadine (PATANOL) 0.1 % ophthalmic solution Place 1 drop into both eyes daily as needed for allergies.     Marland Kitchen ondansetron (ZOFRAN) 4 MG tablet Take 1 tablet (4 mg total) by mouth every 8 (eight) hours as  needed for nausea or vomiting. 20 tablet 1  . predniSONE (DELTASONE) 10 MG tablet 40 mg x 3 days, 20 mg x 3 days, 10 mg x 3 days 21 tablet 0  . simvastatin (ZOCOR) 20 MG tablet TAKE 1 TABLET DAILY (Patient taking differently: TAKE one half TABLET (10 mg) daily at bedtime) 90 tablet 0  . simvastatin (ZOCOR) 20 MG tablet TAKE 1 TABLET DAILY 90 tablet 1  . Tapentadol HCl (NUCYNTA) 75 MG TABS Take 1 tablet (75 mg total) by mouth 2 (two) times daily. (Patient taking differently: Take 75 mg by mouth 2 (two) times daily as needed. ) 60 tablet 0  . tiZANidine (ZANAFLEX) 4 MG tablet TAKE 1 TABLET EVERY 8 HOURSAS NEEDED 270 tablet 1  . traMADol (ULTRAM) 50 MG tablet TAKE 1 TABLET EVERY 8 HOURS AS NEEDED FOR PAIN 30 tablet 0  . valACYclovir (VALTREX) 500 MG tablet TAKE 1 TABLET DAILY AS     NEEDED (Patient taking differently: TAKE 1 TABLET DAILY AS  NEEDED for fever blisters) 90 tablet 0   No current facility-administered medications on file prior to visit.     BP 132/64   Temp 98.2 F (36.8 C) (Oral)   Ht 5' 0.5" (1.537 m)   Wt 176 lb 9.6 oz (80.1 kg)   BMI 33.92 kg/m       Objective:   Physical Exam  Constitutional: She appears well-developed and well-nourished. No distress.  Neurological: She is alert.  Skin: Skin is warm, dry and intact. Lesion ( 0.3 mm x 0.3 mm neoplasm noted on left forearm. There is localized redness and she does endorse pain with palpation.) noted. She is not diaphoretic.  Psychiatric: She has a normal mood and affect. Her behavior is normal. Judgment and thought content normal.  Nursing note and vitals reviewed.     Assessment & Plan:  1. Skin neoplasm - Concern for [facility] Procedure including risks/benefits explained to patient.  Questions were answered. After informed consent was obtained and a time out completed, site was cleansed with betadine and then alcohol. 1% Lidocaine with epinephrine was injected under lesion and then shave biopsy was performed. Area was  cauterized to obtain hemostasis.  Pt tolerated procedure well.  Specimen sent for pathology review.  Pt instructed to keep the area dry for 24 hours and to contact us if he develops redness, drainage or swelling at the site.  Pt may use tylenol as needed for discomfort today.  - Dermatology pathology - Consider referral to dermatology   Dorothyann Peng, NP

## 2016-11-13 ENCOUNTER — Telehealth: Payer: Self-pay | Admitting: Adult Health

## 2016-11-13 DIAGNOSIS — IMO0002 Reserved for concepts with insufficient information to code with codable children: Secondary | ICD-10-CM

## 2016-11-13 NOTE — Telephone Encounter (Signed)
Updated patient on pathology reports. Will send to skin surgery center for further treatment of SCC

## 2016-11-22 ENCOUNTER — Other Ambulatory Visit: Payer: Self-pay | Admitting: Adult Health

## 2016-11-22 DIAGNOSIS — D0462 Carcinoma in situ of skin of left upper limb, including shoulder: Secondary | ICD-10-CM | POA: Diagnosis not present

## 2016-11-22 DIAGNOSIS — L900 Lichen sclerosus et atrophicus: Secondary | ICD-10-CM | POA: Diagnosis not present

## 2016-11-22 NOTE — Telephone Encounter (Signed)
Okay to refill for 1 year. 

## 2016-12-18 ENCOUNTER — Other Ambulatory Visit: Payer: Self-pay

## 2016-12-18 MED ORDER — CITALOPRAM HYDROBROMIDE 20 MG PO TABS: 20.0000 mg | ORAL_TABLET | Freq: Two times a day (BID) | ORAL | 1 refills | Status: DC

## 2016-12-18 NOTE — Telephone Encounter (Signed)
Ok to refill 

## 2016-12-18 NOTE — Telephone Encounter (Signed)
Okay to refill? 

## 2016-12-20 DIAGNOSIS — Z471 Aftercare following joint replacement surgery: Secondary | ICD-10-CM | POA: Diagnosis not present

## 2016-12-20 DIAGNOSIS — Z96652 Presence of left artificial knee joint: Secondary | ICD-10-CM | POA: Diagnosis not present

## 2017-01-25 ENCOUNTER — Encounter: Payer: Self-pay | Admitting: Adult Health

## 2017-01-25 ENCOUNTER — Ambulatory Visit (INDEPENDENT_AMBULATORY_CARE_PROVIDER_SITE_OTHER): Payer: BLUE CROSS/BLUE SHIELD | Admitting: Adult Health

## 2017-01-25 ENCOUNTER — Other Ambulatory Visit: Payer: Self-pay

## 2017-01-25 VITALS — BP 136/78 | Temp 98.3°F | Ht 60.5 in | Wt 174.4 lb

## 2017-01-25 DIAGNOSIS — R05 Cough: Secondary | ICD-10-CM | POA: Diagnosis not present

## 2017-01-25 DIAGNOSIS — Z760 Encounter for issue of repeat prescription: Secondary | ICD-10-CM | POA: Diagnosis not present

## 2017-01-25 DIAGNOSIS — R059 Cough, unspecified: Secondary | ICD-10-CM

## 2017-01-25 MED ORDER — HYDROCODONE-HOMATROPINE 5-1.5 MG/5ML PO SYRP: 5.0000 mL | ORAL_SOLUTION | Freq: Three times a day (TID) | ORAL | 0 refills | Status: DC | PRN

## 2017-01-25 MED ORDER — DIAZEPAM 2 MG PO TABS: 2.0000 mg | ORAL_TABLET | Freq: Two times a day (BID) | ORAL | 0 refills | Status: DC | PRN

## 2017-01-25 MED ORDER — VALACYCLOVIR HCL 500 MG PO TABS: ORAL_TABLET | ORAL | 0 refills | Status: DC

## 2017-01-25 NOTE — Progress Notes (Signed)
Subjective:    Patient ID: Rose Rose, female    DOB: Sep 23, 1956, 61 y.o.   MRN: [id number]  Cough  This is a new problem. The current episode started in the past 7 days. The problem has been unchanged. The cough is non-productive. Associated symptoms include chest pain. Pertinent negatives include no fever, myalgias, nasal congestion, postnasal drip, rhinorrhea, shortness of breath or wheezing. Risk factors for lung disease include smoking/tobacco exposure.     Review of Systems  Constitutional: Negative.  Negative for fever.  HENT: Negative for congestion, postnasal drip, rhinorrhea, sinus pain and sinus pressure.   Respiratory: Positive for cough and chest tightness. Negative for shortness of breath and wheezing.   Cardiovascular: Positive for chest pain.  Musculoskeletal: Negative for myalgias.   Past Medical History:  Diagnosis Date  . Arthritis   . Complication of anesthesia   . Depression   . Hyperlipidemia   . Hypertension   . Insomnia   . Pneumonia   . PONV (postoperative nausea and vomiting)     Social History   Social History  . Marital status: Married    Spouse name: N/A  . Number of children: N/A  . Years of education: N/A   Occupational History  . Not on file.   Social History Main Topics  . Smoking status: Current Some Day Smoker    Last attempt to quit: 10/22/2002  . Smokeless tobacco: Never Used  . Alcohol use No  . Drug use: Yes    Types: Marijuana  . Sexual activity: Not on file   Other Topics Concern  . Not on file   Social History Narrative   Works at [employer] - works with [employer].    Married for 21 years   No children        Past Surgical History:  Procedure Laterality Date  . MYRINGOTOMY     Right ear  . TONSILLECTOMY    . 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 Maureen Ralphs  . TOTAL KNEE ARTHROPLASTY Left 01/16/2016   Procedure: TOTAL LEFT KNEE ARTHROPLASTY;  Surgeon: Gaynelle Arabian, MD;  Location: [facility];  Service:  Orthopedics;  Laterality: Left;    Family History  Problem Relation Age of Onset  . Arthritis Mother   . Vision loss Mother   . Dementia Father   . Parkinsonism Father   . Alzheimer's disease Maternal Aunt     Allergies  Allergen Reactions  . Amoxicillin-Pot Clavulanate     REACTION: swelling  Has patient had a PCN reaction causing immediate rash, facial/tongue/throat swelling, SOB or lightheadedness with hypotension: unk Has patient had a PCN reaction causing severe rash involving mucus membranes or skin necrosis: no Has patient had a PCN reaction that required hospitalization: unk Has patient had a PCN reaction occurring within the last 10 years: yes If all of the above answers are "NO", then may proceed with Cephalosporin use.   . Augmentin [Amoxicillin-Pot Clavulanate] Itching and Swelling  . Oxycodone Hcl Nausea And Vomiting    Current Outpatient Prescriptions on File Prior to Visit  Medication Sig Dispense Refill  . citalopram (CELEXA) 20 MG tablet Take 1 tablet (20 mg total) by mouth 2 (two) times daily. 180 tablet 1  . fluticasone (FLONASE) 50 MCG/ACT nasal spray Place 2 sprays into both nostrils daily as needed for allergies or rhinitis.     . hydrochlorothiazide (HYDRODIURIL) 25 MG tablet TAKE 1/2 TO 1 TABLET       (12.5MG  -  25MG ) DAILY 90 tablet 3  . meclizine (ANTIVERT) 25 MG tablet Take 1 tablet (25 mg total) by mouth 4 (four) times daily as needed for dizziness. 30 tablet 0  . olopatadine (PATANOL) 0.1 % ophthalmic solution Place 1 drop into both eyes daily as needed for allergies.     Marland Kitchen ondansetron (ZOFRAN) 4 MG tablet Take 1 tablet (4 mg total) by mouth every 8 (eight) hours as needed for nausea or vomiting. 20 tablet 1  . simvastatin (ZOCOR) 20 MG tablet TAKE 1 TABLET DAILY (Patient taking differently: TAKE one half TABLET (10 mg) daily at bedtime) 90 tablet 0  . simvastatin (ZOCOR) 20 MG tablet TAKE 1 TABLET DAILY 90 tablet 1  . Tapentadol HCl (NUCYNTA) 75 MG  TABS Take 1 tablet (75 mg total) by mouth 2 (two) times daily. (Patient taking differently: Take 75 mg by mouth 2 (two) times daily as needed. ) 60 tablet 0  . tiZANidine (ZANAFLEX) 4 MG tablet TAKE 1 TABLET EVERY 8 HOURSAS NEEDED 270 tablet 1  . traMADol (ULTRAM) 50 MG tablet TAKE 1 TABLET EVERY 8 HOURS AS NEEDED FOR PAIN 30 tablet 0   No current facility-administered medications on file prior to visit.     BP 136/78 (BP Location: Left Arm, Patient Position: Sitting, Cuff Size: Normal)   Temp 98.3 F (36.8 C) (Oral)   Ht 5' 0.5" (1.537 m)   Wt 174 lb 6.4 oz (79.1 kg)   BMI 33.50 kg/m       Objective:   Physical Exam  Constitutional: She is oriented to person, place, and time. She appears well-developed and well-nourished. No distress.  HENT:  Head: Normocephalic and atraumatic.  Right Ear: External ear normal.  Left Ear: External ear normal.  Nose: Nose normal.  Mouth/Throat: Oropharynx is clear and moist. No oropharyngeal exudate.  Neck: Normal range of motion. Neck supple.  Cardiovascular: Normal rate, regular rhythm, normal heart sounds and intact distal pulses.  Exam reveals no gallop and no friction rub.   No murmur heard. Pulmonary/Chest: Effort normal and breath sounds normal. No respiratory distress. She has no wheezes. She has no rales. She exhibits no tenderness.  Lymphadenopathy:    She has no cervical adenopathy.  Neurological: She is alert and oriented to person, place, and time.  Skin: Skin is warm and dry. No rash noted. She is not diaphoretic. No erythema. No pallor.  Psychiatric: She has a normal mood and affect. Her behavior is normal. Judgment and thought content normal.  Nursing note and vitals reviewed.     Assessment & Plan:  1. Cough - seems viral in nature.  - Add mucinex.  - Follow up if no improvement or sooner if needed - HYDROcodone-homatropine (HYCODAN) 5-1.5 MG/5ML syrup; Take 5 mLs by mouth every 8 (eight) hours as needed for cough.   Dispense: 120 mL; Refill: 0  2. Medication refill  - diazepam (VALIUM) 2 MG tablet; Take 1 tablet (2 mg total) by mouth every 12 (twelve) hours as needed for anxiety.  Dispense: 180 tablet; Refill: 0   Dorothyann Peng, NP

## 2017-01-31 DIAGNOSIS — L821 Other seborrheic keratosis: Secondary | ICD-10-CM | POA: Diagnosis not present

## 2017-01-31 DIAGNOSIS — T07XXXA Unspecified multiple injuries, initial encounter: Secondary | ICD-10-CM | POA: Diagnosis not present

## 2017-01-31 DIAGNOSIS — L814 Other melanin hyperpigmentation: Secondary | ICD-10-CM | POA: Diagnosis not present

## 2017-01-31 DIAGNOSIS — D1801 Hemangioma of skin and subcutaneous tissue: Secondary | ICD-10-CM | POA: Diagnosis not present

## 2017-01-31 DIAGNOSIS — L570 Actinic keratosis: Secondary | ICD-10-CM | POA: Diagnosis not present

## 2017-02-19 DIAGNOSIS — M25562 Pain in left knee: Secondary | ICD-10-CM | POA: Diagnosis not present

## 2017-02-21 DIAGNOSIS — M7632 Iliotibial band syndrome, left leg: Secondary | ICD-10-CM | POA: Diagnosis not present

## 2017-02-22 DIAGNOSIS — M25562 Pain in left knee: Secondary | ICD-10-CM | POA: Diagnosis not present

## 2017-02-25 DIAGNOSIS — M25562 Pain in left knee: Secondary | ICD-10-CM | POA: Diagnosis not present

## 2017-02-27 DIAGNOSIS — M25562 Pain in left knee: Secondary | ICD-10-CM | POA: Diagnosis not present

## 2017-03-01 DIAGNOSIS — M25562 Pain in left knee: Secondary | ICD-10-CM | POA: Diagnosis not present

## 2017-03-04 DIAGNOSIS — M25562 Pain in left knee: Secondary | ICD-10-CM | POA: Diagnosis not present

## 2017-03-06 DIAGNOSIS — M25562 Pain in left knee: Secondary | ICD-10-CM | POA: Diagnosis not present

## 2017-03-08 DIAGNOSIS — M25562 Pain in left knee: Secondary | ICD-10-CM | POA: Diagnosis not present

## 2017-03-13 DIAGNOSIS — M25562 Pain in left knee: Secondary | ICD-10-CM | POA: Diagnosis not present

## 2017-03-14 DIAGNOSIS — M79674 Pain in right toe(s): Secondary | ICD-10-CM | POA: Diagnosis not present

## 2017-03-14 DIAGNOSIS — M7989 Other specified soft tissue disorders: Secondary | ICD-10-CM | POA: Diagnosis not present

## 2017-03-14 DIAGNOSIS — S90121A Contusion of right lesser toe(s) without damage to nail, initial encounter: Secondary | ICD-10-CM | POA: Diagnosis not present

## 2017-03-15 DIAGNOSIS — M25562 Pain in left knee: Secondary | ICD-10-CM | POA: Diagnosis not present

## 2017-03-26 DIAGNOSIS — M25562 Pain in left knee: Secondary | ICD-10-CM | POA: Diagnosis not present

## 2017-03-28 DIAGNOSIS — M25562 Pain in left knee: Secondary | ICD-10-CM | POA: Diagnosis not present

## 2017-04-11 ENCOUNTER — Other Ambulatory Visit: Payer: Self-pay | Admitting: Adult Health

## 2017-06-25 ENCOUNTER — Encounter: Payer: Self-pay | Admitting: Family Medicine

## 2017-06-25 ENCOUNTER — Ambulatory Visit (INDEPENDENT_AMBULATORY_CARE_PROVIDER_SITE_OTHER): Payer: BLUE CROSS/BLUE SHIELD | Admitting: Family Medicine

## 2017-06-25 VITALS — BP 122/80 | HR 58 | Temp 98.5°F | Ht 60.5 in | Wt 179.9 lb

## 2017-06-25 DIAGNOSIS — T63461A Toxic effect of venom of wasps, accidental (unintentional), initial encounter: Secondary | ICD-10-CM | POA: Diagnosis not present

## 2017-06-25 MED ORDER — CETIRIZINE HCL 10 MG PO TABS: 10.0000 mg | ORAL_TABLET | Freq: Every day | ORAL | 0 refills | Status: DC

## 2017-06-25 MED ORDER — ONDANSETRON HCL 4 MG PO TABS: 4.0000 mg | ORAL_TABLET | Freq: Three times a day (TID) | ORAL | 0 refills | Status: DC | PRN

## 2017-06-25 NOTE — Progress Notes (Signed)
Subjective:    Patient ID: Rose Rose, female    DOB: 04/07/1956, 61 y.o.   MRN: [id number]  Chief Complaint  Patient presents with  . Insect Bite    patient was stung by yellow jackets on the right lower leg x1 day ago  . Nausea    last night; however questioned if due to bee stings  . Dizziness    HPI Patient was seen today for acute complaint.  Insect sting: -Yellow jacket stung pt last night on R lower leg -some edema, itching, pain at site, lightheadedness, "clamy". -This am, pt with HA, N, decreased appetite -Took 2 tylenol  -Denies V, SOB, abd pain -no h/o anaphylaxis.  Does endorse increased response (erythema) to mosquito or gnat bites.   Past Medical History:  Diagnosis Date  . Arthritis   . Complication of anesthesia   . Depression   . Hyperlipidemia   . Hypertension   . Insomnia   . Pneumonia   . PONV (postoperative nausea and vomiting)     Past Surgical History:  Procedure Laterality Date  . MYRINGOTOMY     Right ear  . TONSILLECTOMY    . 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 Maureen Ralphs  . TOTAL KNEE ARTHROPLASTY Left 01/16/2016   Procedure: TOTAL LEFT KNEE ARTHROPLASTY;  Surgeon: Gaynelle Arabian, MD;  Location: [facility];  Service: Orthopedics;  Laterality: Left;    Family History  Problem Relation Age of Onset  . Arthritis Mother   . Vision loss Mother   . Dementia Father   . Parkinsonism Father   . Alzheimer's disease Maternal Aunt     Social History   Social History  . Marital status: Married    Spouse name: N/A  . Number of children: N/A  . Years of education: N/A   Occupational History  . Not on file.   Social History Main Topics  . Smoking status: Current Some Day Smoker    Last attempt to quit: 10/22/2002  . Smokeless tobacco: Never Used  . Alcohol use No  . Drug use: Yes    Types: Marijuana  . Sexual activity: Not on file   Other Topics Concern  . Not on file   Social History Narrative   Works at [employer] - works  with [employer].    Married for 21 years   No children        Outpatient Medications Prior to Visit  Medication Sig Dispense Refill  . citalopram (CELEXA) 20 MG tablet Take 1 tablet (20 mg total) by mouth 2 (two) times daily. 180 tablet 1  . diazepam (VALIUM) 2 MG tablet Take 1 tablet (2 mg total) by mouth every 12 (twelve) hours as needed for anxiety. 180 tablet 0  . fluticasone (FLONASE) 50 MCG/ACT nasal spray Place 2 sprays into both nostrils daily as needed for allergies or rhinitis.     . hydrochlorothiazide (HYDRODIURIL) 25 MG tablet TAKE 1/2 TO 1 TABLET       (12.5MG  - 25MG ) DAILY 90 tablet 3  . HYDROcodone-homatropine (HYCODAN) 5-1.5 MG/5ML syrup Take 5 mLs by mouth every 8 (eight) hours as needed for cough. 120 mL 0  . meclizine (ANTIVERT) 25 MG tablet Take 1 tablet (25 mg total) by mouth 4 (four) times daily as needed for dizziness. 30 tablet 0  . olopatadine (PATANOL) 0.1 % ophthalmic solution Place 1 drop into both eyes daily as needed for allergies.     . simvastatin (ZOCOR) 20 MG tablet  TAKE 1 TABLET DAILY (Patient taking differently: TAKE one half TABLET (10 mg) daily at bedtime) 90 tablet 0  . simvastatin (ZOCOR) 20 MG tablet TAKE 1 TABLET DAILY 90 tablet 1  . Tapentadol HCl (NUCYNTA) 75 MG TABS Take 1 tablet (75 mg total) by mouth 2 (two) times daily. (Patient taking differently: Take 75 mg by mouth 2 (two) times daily as needed. ) 60 tablet 0  . tiZANidine (ZANAFLEX) 4 MG tablet TAKE 1 TABLET EVERY 8 HOURSAS NEEDED 270 tablet 1  . valACYclovir (VALTREX) 500 MG tablet TAKE 1 TABLET DAILY AS  NEEDED for fever blisters 90 tablet 0  . ondansetron (ZOFRAN) 4 MG tablet Take 1 tablet (4 mg total) by mouth every 8 (eight) hours as needed for nausea or vomiting. 20 tablet 1  . simvastatin (ZOCOR) 20 MG tablet TAKE 1 TABLET DAILY 90 tablet 1  . traMADol (ULTRAM) 50 MG tablet TAKE 1 TABLET EVERY 8 HOURS AS NEEDED FOR PAIN 30 tablet 0   No facility-administered medications prior  to visit.     Allergies  Allergen Reactions  . Amoxicillin-Pot Clavulanate     REACTION: swelling  Has patient had a PCN reaction causing immediate rash, facial/tongue/throat swelling, SOB or lightheadedness with hypotension: unk Has patient had a PCN reaction causing severe rash involving mucus membranes or skin necrosis: no Has patient had a PCN reaction that required hospitalization: unk Has patient had a PCN reaction occurring within the last 10 years: yes If all of the above answers are "NO", then may proceed with Cephalosporin use.   . Augmentin [Amoxicillin-Pot Clavulanate] Itching and Swelling  . Oxycodone Hcl Nausea And Vomiting    ROS  General: Denies fever, chills, night sweats, changes in weight    +changes in appetite HEENT: Denies ear pain, changes in vision, rhinorrhea, sore throat   +lightheadedness CV: Denies CP, palpitations, SOB, orthopnea Pulm: Denies SOB, cough, wheezing GI: Denies abdominal pain, vomiting, diarrhea, constipation   +nausea GU: Denies dysuria, hematuria, frequency, vaginal discharge Msk: Denies muscle cramps, joint pains Neuro: Denies weakness, numbness, tingling Skin: Denies rashes, bruising    +insect bite Psych: Denies depression, anxiety, hallucinations      Objective:    Blood pressure 122/80, pulse (!) 58, temperature 98.5 F (36.9 C), temperature source Oral, height 5' 0.5" (1.537 m), weight 179 lb 14.4 oz (81.6 kg), SpO2 98 %.   Gen. Pleasant, well-nourished, in no distress, normal affect   HEENT: [place]/AT, face symmetric, no scleral icterus, PERRLA, nares patent without drainage Lungs: no accessory muscle use, CTAB, no wheezes or rales Cardiovascular: RR, heart sounds  normal, no m/r/g, no peripheral edema Abdomen: soft and non-tender, no hepatosplenomegaly, BS normal. Neuro:  A&Ox3, CN II-XII intact, normal gait Skin:  Warm, dry, intact.  R anterior medial lower leg with puncture and surrounding erythema, mildly warm compared to  remaining leg, no edema   Wt Readings from Last 3 Encounters:  06/25/17 179 lb 14.4 oz (81.6 kg)  01/25/17 174 lb 6.4 oz (79.1 kg)  11/06/16 176 lb 9.6 oz (80.1 kg)    Diabetic Foot Exam - Simple   No data filed     Lab Results  Component Value Date   WBC 12.2 (H) 01/17/2016   HGB 13.7 01/17/2016   HCT 41.3 01/17/2016   PLT 272 01/17/2016   GLUCOSE 113 (H) 01/17/2016   CHOL 170 07/01/2015   TRIG 229.0 (H) 07/01/2015   HDL 50.60 07/01/2015   LDLDIRECT 99.0 07/01/2015   LDLCALC  91 06/08/2009   ALT 21 01/06/2016   AST 25 01/06/2016   NA 139 01/17/2016   K 3.8 01/17/2016   CL 105 01/17/2016   CREATININE 0.54 01/17/2016   BUN 13 01/17/2016   CO2 25 01/17/2016   TSH 1.58 07/01/2015   INR 1.01 01/06/2016   HGBA1C 6.0 07/01/2015    Assessment/Plan:  Yellow jacket sting, accidental or unintentional, initial encounter  - Plan: ondansetron (ZOFRAN) 4 MG tablet, cetirizine (ZYRTEC) 10 MG tablet -Ice, supportive care -Given handout and RTC precautions.

## 2017-06-25 NOTE — Patient Instructions (Addendum)
Bee, Wasp, or [employer], Adult Bees, wasps, and hornets are part of a family of insects that can sting people. These stings can cause pain and inflammation, but they are usually not serious. However, some people may have an allergic reaction to a sting. This can cause the symptoms to be more severe. What increases the risk? You may be at a greater risk of getting stung if you:  Provoke a stinging insect by swatting or disturbing it.  Wear strong-smelling soaps, deodorants, or body sprays.  Spend time outdoors near gardens with flowers or fruit trees or in clothes that expose skin.  Eat or drink outside.  What are the signs or symptoms? Common symptoms of this condition include:  A red lump in the skin that sometimes has a tiny hole in the center. In some cases, a stinger may be in the center of the wound.  Pain and itching at the sting site.  Redness and swelling around the sting site. If you have an allergic reaction (localized allergic reaction), the swelling and redness may spread out from the sting site. In some cases, this reaction can continue to develop over the next 24-48 hours.  In rare cases, a person may have a severe allergic reaction (anaphylactic reaction) to a sting. Symptoms of an anaphylactic reaction may include:  Wheezing or difficulty breathing.  Raised, itchy, red patches on the skin (hives).  Nausea or vomiting.  Abdominal cramping.  Diarrhea.  Tightness in the chest or chest pain.  Dizziness or fainting.  Redness of the face (flushing).  Hoarse voice.  Swollen tongue, lips, or face.  How is this diagnosed? This condition is usually diagnosed based on your symptoms and medical history as well as a physical exam. You may have an allergy test to determine if you are allergic to the substance that the insect injected during the sting (venom). How is this treated? If you were stung by a bee, the stinger and a small sac of venom may be in the wound.  It is important to remove the stinger as soon as possible. You can do this by brushing across the wound with gauze, a fingernail, or a flat card such as a credit card. Removing the stinger can help reduce the severity of your body's reaction to the sting. Most stings can be treated with:  Icing to reduce swelling in the area.  Medicines (antihistamines) to treat itching or an allergic reaction.  Medicines to help reduce pain. These may be medicines that you take by mouth, or medicated creams or lotions that you apply to your skin.  Pay close attention to your symptoms after you have been stung. If possible, have someone stay with you to make sure you do not have an allergic reaction. If you have any signs of an allergic reaction, call your health care provider. If you have ever had a severe allergic reaction, your health care provider may give you an inhaler or injectable medicine (epinephrine auto-injector) to use if necessary. Follow these instructions at home:  Wash the sting site 2-3 times each day with soap and water as told by your health care provider.  Apply or take over-the-counter and prescription medicines only as told by your health care provider.  If directed, apply ice to the sting area. ? Put ice in a plastic bag. ? Place a towel between your skin and the bag. ? Leave the ice on for 20 minutes, 2-3 times a day.  Do not scratch the sting  area.  If you had a severe allergic reaction to a sting, you may need: ? To wear a medical bracelet or necklace that lists the allergy. ? To learn when and how to use an anaphylaxis kit or epinephrine injection. Your family members and coworkers may also need to learn this. ? To carry an anaphylaxis kit or epinephrine injection with you at all times. How is this prevented?  Avoid swatting at stinging insects and disturbing insect nests.  Do not use fragrant soaps or lotions.  Wear shoes, pants, and long sleeves when spending time  outdoors, especially in grassy areas where stinging insects are common.  Keep outdoor areas free from nests or hives.  Keep food and drink containers covered when eating outdoors.  Avoid working or sitting near [employer], if possible.  Wear gloves if you are gardening or working outdoors.  If an attack by a stinging insect or a swarm seems likely in the moment, move away from the area or find a barrier between you and the insect(s), such as a door. Contact a health care provider if:  Your symptoms do not get better in 2-3 days.  You have redness, swelling, or pain that spreads beyond the area of the sting.  You have a fever. Get help right away if: You have symptoms of a severe allergic reaction. These include:  Wheezing or difficulty breathing.  Tightness in the chest or chest pain.  Light-headedness or fainting.  Itchy, raised, red patches on the skin.  Nausea or vomiting.  Abdominal cramping.  Diarrhea.  A swollen tongue or lips, or trouble swallowing.  Dizziness or fainting.  Summary  Stings from bees, wasps, and hornets can cause pain and inflammation, but they are usually not serious. However, some people may have an allergic reaction to a sting. This can cause the symptoms to be more severe.  Pay close attention to your symptoms after you have been stung. If possible, have someone stay with you to make sure you do not have an allergic reaction.  Call your health care provider if you have any signs of an allergic reaction. This information is not intended to replace advice given to you by your health care provider. Make sure you discuss any questions you have with your health care provider. Document Released: 10/08/2005 Document Revised: 12/13/2016 Document Reviewed: 12/13/2016 Elsevier Interactive Patient Education  2018 [employer].    A prescription for Zyrtec  was also sent to your pharmacy.  This is an anti-histamine (or an allergy medicine)  It  will help with your itching.  You can find this over the counter at the drug store if you do not want to get the prescription.  If you are feeling worse (fever, N/V, chills, etc) please don't hesitate to contact the clinic or proceed to the ED.

## 2017-07-01 ENCOUNTER — Other Ambulatory Visit: Payer: Self-pay | Admitting: Adult Health

## 2017-07-11 ENCOUNTER — Ambulatory Visit (INDEPENDENT_AMBULATORY_CARE_PROVIDER_SITE_OTHER): Payer: BLUE CROSS/BLUE SHIELD | Admitting: Adult Health

## 2017-07-11 ENCOUNTER — Encounter: Payer: Self-pay | Admitting: Adult Health

## 2017-07-11 VITALS — BP 142/80 | Temp 98.7°F | Wt 182.0 lb

## 2017-07-11 DIAGNOSIS — R42 Dizziness and giddiness: Secondary | ICD-10-CM | POA: Diagnosis not present

## 2017-07-11 MED ORDER — MECLIZINE HCL 25 MG PO TABS: 25.0000 mg | ORAL_TABLET | Freq: Three times a day (TID) | ORAL | 1 refills | Status: DC | PRN

## 2017-07-11 NOTE — Progress Notes (Signed)
Subjective:    Patient ID: Rose Rose, female    DOB: [birth year], 61 y.o.   MRN: [id number]  HPI  61 year old female who  has a past medical history of Arthritis; Complication of anesthesia; Depression; Hyperlipidemia; Hypertension; Insomnia; Pneumonia; and PONV (postoperative nausea and vomiting). She presents to the office today for dizziness and left ear pain. She reports that this has been present for 2 days. Feels as though it is her typical episode of vertigo.   Dizziness is worse with head movement and position changes.   She denies any nausea, vomiting, diarrhea   Review of Systems See HPI   Past Medical History:  Diagnosis Date  . Arthritis   . Complication of anesthesia   . Depression   . Hyperlipidemia   . Hypertension   . Insomnia   . Pneumonia   . PONV (postoperative nausea and vomiting)     Social History   Social History  . Marital status: Married    Spouse name: N/A  . Number of children: N/A  . Years of education: N/A   Occupational History  . Not on file.   Social History Main Topics  . Smoking status: Current Some Day Smoker    Last attempt to quit: 10/22/2002  . Smokeless tobacco: Never Used  . Alcohol use No  . Drug use: Yes    Types: Marijuana  . Sexual activity: Not on file   Other Topics Concern  . Not on file   Social History Narrative   Works at [employer] - works with [employer].    Married for 21 years   No children        Past Surgical History:  Procedure Laterality Date  . MYRINGOTOMY     Right ear  . TONSILLECTOMY    . 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 Maureen Ralphs  . TOTAL KNEE ARTHROPLASTY Left 01/16/2016   Procedure: TOTAL LEFT KNEE ARTHROPLASTY;  Surgeon: Gaynelle Arabian, MD;  Location: [facility];  Service: Orthopedics;  Laterality: Left;    Family History  Problem Relation Age of Onset  . Arthritis Mother   . Vision loss Mother   . Dementia Father   . Parkinsonism Father   . Alzheimer's disease  Maternal Aunt     Allergies  Allergen Reactions  . Amoxicillin-Pot Clavulanate     REACTION: swelling  Has patient had a PCN reaction causing immediate rash, facial/tongue/throat swelling, SOB or lightheadedness with hypotension: unk Has patient had a PCN reaction causing severe rash involving mucus membranes or skin necrosis: no Has patient had a PCN reaction that required hospitalization: unk Has patient had a PCN reaction occurring within the last 10 years: yes If all of the above answers are "NO", then may proceed with Cephalosporin use.   . Augmentin [Amoxicillin-Pot Clavulanate] Itching and Swelling  . Oxycodone Hcl Nausea And Vomiting    Current Outpatient Prescriptions on File Prior to Visit  Medication Sig Dispense Refill  . cetirizine (ZYRTEC) 10 MG tablet Take 1 tablet (10 mg total) by mouth daily. 30 tablet 0  . citalopram (CELEXA) 20 MG tablet TAKE 1 TABLET TWICE A DAY 180 tablet 1  . diazepam (VALIUM) 2 MG tablet Take 1 tablet (2 mg total) by mouth every 12 (twelve) hours as needed for anxiety. 180 tablet 0  . fluticasone (FLONASE) 50 MCG/ACT nasal spray Place 2 sprays into both nostrils daily as needed for allergies or rhinitis.     . hydrochlorothiazide (  HYDRODIURIL) 25 MG tablet TAKE 1/2 TO 1 TABLET       (12.5MG  - 25MG ) DAILY 90 tablet 3  . HYDROcodone-homatropine (HYCODAN) 5-1.5 MG/5ML syrup Take 5 mLs by mouth every 8 (eight) hours as needed for cough. 120 mL 0  . olopatadine (PATANOL) 0.1 % ophthalmic solution Place 1 drop into both eyes daily as needed for allergies.     Marland Kitchen ondansetron (ZOFRAN) 4 MG tablet Take 1 tablet (4 mg total) by mouth every 8 (eight) hours as needed for nausea or vomiting. 20 tablet 0  . simvastatin (ZOCOR) 20 MG tablet TAKE 1 TABLET DAILY (Patient taking differently: TAKE one half TABLET (10 mg) daily at bedtime) 90 tablet 0  . simvastatin (ZOCOR) 20 MG tablet TAKE 1 TABLET DAILY 90 tablet 1  . Tapentadol HCl (NUCYNTA) 75 MG TABS Take 1  tablet (75 mg total) by mouth 2 (two) times daily. (Patient taking differently: Take 75 mg by mouth 2 (two) times daily as needed. ) 60 tablet 0  . tiZANidine (ZANAFLEX) 4 MG tablet TAKE 1 TABLET EVERY 8 HOURSAS NEEDED 270 tablet 1  . valACYclovir (VALTREX) 500 MG tablet TAKE 1 TABLET DAILY AS  NEEDED for fever blisters 90 tablet 0   No current facility-administered medications on file prior to visit.     BP (!) 142/80 (BP Location: Left Arm)   Temp 98.7 F (37.1 C) (Oral)   Wt 182 lb (82.6 kg)   BMI 34.96 kg/m       Objective:   Physical Exam  Constitutional: She is oriented to person, place, and time. She appears well-developed and well-nourished. No distress.  HENT:  Head: Normocephalic and atraumatic.  Right Ear: External ear normal.  Left Ear: External ear normal.  Nose: Nose normal.  Mouth/Throat: Oropharynx is clear and moist. No oropharyngeal exudate.  Eyes: Pupils are equal, round, and reactive to light. Conjunctivae are normal. Right eye exhibits no discharge. Left eye exhibits no discharge. Right eye exhibits nystagmus (horizontal ). Left eye exhibits nystagmus (horizontal ).  Cardiovascular: Normal rate, regular rhythm, normal heart sounds and intact distal pulses.  Exam reveals no gallop and no friction rub.   No murmur heard. Pulmonary/Chest: Effort normal and breath sounds normal. No respiratory distress. She has no wheezes. She has no rales. She exhibits no tenderness.  Neurological: She is alert and oriented to person, place, and time.  Skin: Skin is warm and dry. No rash noted. She is not diaphoretic. No erythema. No pallor.  Psychiatric: She has a normal mood and affect. Her behavior is normal. Judgment and thought content normal.  Nursing note and vitals reviewed.     Assessment & Plan:  1. Vertigo - exam consistent with vertigo. Will prescribe meclizine as she has responded well to this.  - meclizine (ANTIVERT) 25 MG tablet; Take 1 tablet (25 mg total) by  mouth 3 (three) times daily as needed for dizziness.  Dispense: 30 tablet; Refill: 1 - Consider vestibular PT  - Follow up as needed  Dorothyann Peng, NP

## 2017-07-12 ENCOUNTER — Encounter: Payer: Self-pay | Admitting: Adult Health

## 2017-08-30 ENCOUNTER — Telehealth: Payer: Self-pay | Admitting: Adult Health

## 2017-08-30 DIAGNOSIS — M7632 Iliotibial band syndrome, left leg: Secondary | ICD-10-CM | POA: Diagnosis not present

## 2017-08-30 DIAGNOSIS — M1712 Unilateral primary osteoarthritis, left knee: Secondary | ICD-10-CM | POA: Diagnosis not present

## 2017-08-30 DIAGNOSIS — Z760 Encounter for issue of repeat prescription: Secondary | ICD-10-CM

## 2017-08-30 MED ORDER — DIAZEPAM 2 MG PO TABS: 2.0000 mg | ORAL_TABLET | Freq: Two times a day (BID) | ORAL | 0 refills | Status: DC | PRN

## 2017-08-30 NOTE — Telephone Encounter (Signed)
Ok to refill for 30 days  

## 2017-08-30 NOTE — Telephone Encounter (Signed)
Request is to be sent to 30 day supply company.  Ok to send for 90 days?

## 2017-08-30 NOTE — Telephone Encounter (Signed)
ok 

## 2017-08-30 NOTE — Telephone Encounter (Signed)
Pt request refill  diazepam (VALIUM) 2 MG tablet   [facility] [place] [place] [facility]   Pt states she is having leg/knee spasms at night and she has to take a valium at night to rest

## 2017-08-30 NOTE — Telephone Encounter (Signed)
Faxed to the pharmacy.

## 2017-09-03 ENCOUNTER — Encounter: Payer: Self-pay | Admitting: Adult Health

## 2017-09-03 ENCOUNTER — Ambulatory Visit: Payer: BLUE CROSS/BLUE SHIELD | Admitting: Adult Health

## 2017-09-03 VITALS — BP 124/80 | Temp 98.3°F | Wt 185.0 lb

## 2017-09-03 DIAGNOSIS — L84 Corns and callosities: Secondary | ICD-10-CM | POA: Diagnosis not present

## 2017-09-03 NOTE — Progress Notes (Signed)
Subjective:    Patient ID: Rose Rose, female    DOB: 1956/10/08, 61 y.o.   MRN: [id number]  HPI  61 year old female who  has a past medical history of Arthritis, Complication of anesthesia, Depression, Hyperlipidemia, Hypertension, Insomnia, Pneumonia, and PONV (postoperative nausea and vomiting). She presents to the office today for an acute complaint of possible corn on left small toe. She has been placing corn removal pads on the area. She reports continues pain that is worse with walking and pressure  Review of Systems See HPI   Past Medical History:  Diagnosis Date  . Arthritis   . Complication of anesthesia   . Depression   . Hyperlipidemia   . Hypertension   . Insomnia   . Pneumonia   . PONV (postoperative nausea and vomiting)     Social History   Socioeconomic History  . Marital status: Married    Spouse name: Not on file  . Number of children: Not on file  . Years of education: Not on file  . Highest education level: Not on file  Social Needs  . Financial resource strain: Not on file  . Food insecurity - worry: Not on file  . Food insecurity - inability: Not on file  . Transportation needs - medical: Not on file  . Transportation needs - non-medical: Not on file  Occupational History  . Not on file  Tobacco Use  . Smoking status: Current Some Day Smoker    Last attempt to quit: 10/22/2002    Years since quitting: 14.8  . Smokeless tobacco: Never Used  Substance and Sexual Activity  . Alcohol use: No  . Drug use: Yes    Types: Marijuana  . Sexual activity: Not on file  Other Topics Concern  . Not on file  Social History Narrative   Works at [employer] - works with [employer].    Married for 21 years   No children     Past Surgical History:  Procedure Laterality Date  . MYRINGOTOMY     Right ear  . TONSILLECTOMY    . 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 Maureen Ralphs    Family History  Problem Relation Age of Onset  . Arthritis  Mother   . Vision loss Mother   . Dementia Father   . Parkinsonism Father   . Alzheimer's disease Maternal Aunt     Allergies  Allergen Reactions  . Amoxicillin-Pot Clavulanate     REACTION: swelling  Has patient had a PCN reaction causing immediate rash, facial/tongue/throat swelling, SOB or lightheadedness with hypotension: unk Has patient had a PCN reaction causing severe rash involving mucus membranes or skin necrosis: no Has patient had a PCN reaction that required hospitalization: unk Has patient had a PCN reaction occurring within the last 10 years: yes If all of the above answers are "NO", then may proceed with Cephalosporin use.   . Augmentin [Amoxicillin-Pot Clavulanate] Itching and Swelling  . Oxycodone Hcl Nausea And Vomiting    Current Outpatient Medications on File Prior to Visit  Medication Sig Dispense Refill  . cetirizine (ZYRTEC) 10 MG tablet Take 1 tablet (10 mg total) by mouth daily. 30 tablet 0  . citalopram (CELEXA) 20 MG tablet TAKE 1 TABLET TWICE A DAY 180 tablet 1  . diazepam (VALIUM) 2 MG tablet Take 1 tablet (2 mg total) every 12 (twelve) hours as needed by mouth for anxiety. 180 tablet 0  . fluticasone (FLONASE) 50 MCG/ACT  nasal spray Place 2 sprays into both nostrils daily as needed for allergies or rhinitis.     . hydrochlorothiazide (HYDRODIURIL) 25 MG tablet TAKE 1/2 TO 1 TABLET       (12.5MG  - 25MG ) DAILY 90 tablet 3  . meclizine (ANTIVERT) 25 MG tablet Take 1 tablet (25 mg total) by mouth 3 (three) times daily as needed for dizziness. 30 tablet 1  . olopatadine (PATANOL) 0.1 % ophthalmic solution Place 1 drop into both eyes daily as needed for allergies.     . simvastatin (ZOCOR) 20 MG tablet TAKE 1 TABLET DAILY (Patient taking differently: TAKE one half TABLET (10 mg) daily at bedtime) 90 tablet 0  . simvastatin (ZOCOR) 20 MG tablet TAKE 1 TABLET DAILY 90 tablet 1  . Tapentadol HCl (NUCYNTA) 75 MG TABS Take 1 tablet (75 mg total) by mouth 2 (two)  times daily. (Patient taking differently: Take 75 mg by mouth 2 (two) times daily as needed. ) 60 tablet 0  . tiZANidine (ZANAFLEX) 4 MG tablet TAKE 1 TABLET EVERY 8 HOURSAS NEEDED 270 tablet 1  . valACYclovir (VALTREX) 500 MG tablet TAKE 1 TABLET DAILY AS  NEEDED for fever blisters 90 tablet 0  . ondansetron (ZOFRAN) 4 MG tablet Take 1 tablet (4 mg total) by mouth every 8 (eight) hours as needed for nausea or vomiting. (Patient not taking: Reported on 09/03/2017) 20 tablet 0   No current facility-administered medications on file prior to visit.     BP 124/80   Temp 98.3 F (36.8 C) (Oral)   Wt 185 lb (83.9 kg)   BMI 35.54 kg/m       Objective:   Physical Exam  Constitutional: She is oriented to person, place, and time. She appears well-developed and well-nourished. No distress.  Cardiovascular: Normal rate, regular rhythm, normal heart sounds and intact distal pulses. Exam reveals no gallop and no friction rub.  No murmur heard. Neurological: She is alert and oriented to person, place, and time.  Skin: Skin is warm and dry. She is not diaphoretic.  Possible corn on left little toe.White slothing tissue due to corn removal pad   Psychiatric: She has a normal mood and affect. Her behavior is normal. Judgment and thought content normal.  Nursing note and vitals reviewed.     Assessment & Plan:  1. Corn or callus - Ambulatory referral to Podiatry   Dorothyann Peng, NP

## 2017-09-22 ENCOUNTER — Other Ambulatory Visit: Payer: Self-pay | Admitting: Adult Health

## 2017-09-24 NOTE — Telephone Encounter (Signed)
No cpx since 2016.  Please advise.

## 2017-09-24 NOTE — Telephone Encounter (Signed)
Ok to refill for 30 days. Can we get her scheduled for a CPE

## 2017-09-26 ENCOUNTER — Telehealth: Payer: Self-pay

## 2017-09-26 NOTE — Telephone Encounter (Signed)
Pt Rx has been sent to pharmacy for refills, Pt has been scheduled for a physical/ fasting 10/08/2017 at 8 am.

## 2017-10-03 ENCOUNTER — Encounter: Payer: Self-pay | Admitting: Podiatry

## 2017-10-03 ENCOUNTER — Ambulatory Visit: Payer: BLUE CROSS/BLUE SHIELD | Admitting: Podiatry

## 2017-10-03 ENCOUNTER — Other Ambulatory Visit: Payer: Self-pay | Admitting: Adult Health

## 2017-10-03 DIAGNOSIS — M205X9 Other deformities of toe(s) (acquired), unspecified foot: Secondary | ICD-10-CM

## 2017-10-03 DIAGNOSIS — L84 Corns and callosities: Secondary | ICD-10-CM | POA: Diagnosis not present

## 2017-10-03 NOTE — Telephone Encounter (Signed)
Sent to the pharmacy by e-scribe for 90 days.  Pt has upcoming cpx 09/2017.

## 2017-10-04 DIAGNOSIS — M7632 Iliotibial band syndrome, left leg: Secondary | ICD-10-CM | POA: Diagnosis not present

## 2017-10-08 ENCOUNTER — Encounter: Payer: Self-pay | Admitting: Adult Health

## 2017-10-08 ENCOUNTER — Other Ambulatory Visit (HOSPITAL_COMMUNITY): Payer: Self-pay | Admitting: Orthopedic Surgery

## 2017-10-08 ENCOUNTER — Other Ambulatory Visit (HOSPITAL_COMMUNITY)
Admission: RE | Admit: 2017-10-08 | Discharge: 2017-10-08 | Disposition: A | Discharge status: Home / Self Care | Payer: BLUE CROSS/BLUE SHIELD | Source: Ambulatory Visit | Attending: Adult Health | Admitting: Adult Health

## 2017-10-08 ENCOUNTER — Ambulatory Visit (INDEPENDENT_AMBULATORY_CARE_PROVIDER_SITE_OTHER): Payer: BLUE CROSS/BLUE SHIELD | Admitting: Adult Health

## 2017-10-08 VITALS — BP 146/88 | Temp 98.8°F | Ht 62.0 in | Wt 187.0 lb

## 2017-10-08 DIAGNOSIS — Z0000 Encounter for general adult medical examination without abnormal findings: Secondary | ICD-10-CM

## 2017-10-08 DIAGNOSIS — Z96652 Presence of left artificial knee joint: Secondary | ICD-10-CM

## 2017-10-08 DIAGNOSIS — Z01419 Encounter for gynecological examination (general) (routine) without abnormal findings: Secondary | ICD-10-CM

## 2017-10-08 DIAGNOSIS — F4322 Adjustment disorder with anxiety: Secondary | ICD-10-CM

## 2017-10-08 DIAGNOSIS — Z1159 Encounter for screening for other viral diseases: Secondary | ICD-10-CM | POA: Diagnosis not present

## 2017-10-08 DIAGNOSIS — I10 Essential (primary) hypertension: Secondary | ICD-10-CM | POA: Diagnosis not present

## 2017-10-08 DIAGNOSIS — Z1211 Encounter for screening for malignant neoplasm of colon: Secondary | ICD-10-CM

## 2017-10-08 DIAGNOSIS — E782 Mixed hyperlipidemia: Secondary | ICD-10-CM | POA: Diagnosis not present

## 2017-10-08 DIAGNOSIS — Z471 Aftercare following joint replacement surgery: Secondary | ICD-10-CM

## 2017-10-08 DIAGNOSIS — Z760 Encounter for issue of repeat prescription: Secondary | ICD-10-CM

## 2017-10-08 LAB — CBC WITH DIFFERENTIAL/PLATELET
BASOS PCT: 0.7 % (ref 0.0–3.0)
Basophils Absolute: 0 10*3/uL (ref 0.0–0.1)
EOS ABS: 0 10*3/uL (ref 0.0–0.7)
Eosinophils Relative: 0.5 % (ref 0.0–5.0)
HCT: 43.8 % (ref 36.0–46.0)
Hemoglobin: 14.7 g/dL (ref 12.0–15.0)
LYMPHS ABS: 1.9 10*3/uL (ref 0.7–4.0)
Lymphocytes Relative: 27.1 % (ref 12.0–46.0)
MCHC: 33.5 g/dL (ref 30.0–36.0)
MCV: 90.6 fl (ref 78.0–100.0)
MONO ABS: 0.4 10*3/uL (ref 0.1–1.0)
Monocytes Relative: 5 % (ref 3.0–12.0)
NEUTROS ABS: 4.7 10*3/uL (ref 1.4–7.7)
NEUTROS PCT: 66.7 % (ref 43.0–77.0)
PLATELETS: 268 10*3/uL (ref 150.0–400.0)
RBC: 4.84 Mil/uL (ref 3.87–5.11)
RDW: 14 % (ref 11.5–15.5)
WBC: 7 10*3/uL (ref 4.0–10.5)

## 2017-10-08 LAB — POC URINALSYSI DIPSTICK (AUTOMATED)
BILIRUBIN UA: NEGATIVE
GLUCOSE UA: NEGATIVE
KETONES UA: NEGATIVE
Leukocytes, UA: NEGATIVE
Nitrite, UA: NEGATIVE
PROTEIN UA: NEGATIVE
SPEC GRAV UA: 1.015 (ref 1.010–1.025)
Urobilinogen, UA: 0.2 E.U./dL
pH, UA: 7 (ref 5.0–8.0)

## 2017-10-08 LAB — HEPATIC FUNCTION PANEL
ALT: 15 U/L (ref 0–35)
AST: 16 U/L (ref 0–37)
Albumin: 4.4 g/dL (ref 3.5–5.2)
Alkaline Phosphatase: 70 U/L (ref 39–117)
BILIRUBIN TOTAL: 0.5 mg/dL (ref 0.2–1.2)
Bilirubin, Direct: 0.1 mg/dL (ref 0.0–0.3)
Total Protein: 6.9 g/dL (ref 6.0–8.3)

## 2017-10-08 LAB — TSH: TSH: 1.67 u[IU]/mL (ref 0.35–4.50)

## 2017-10-08 LAB — LIPID PANEL
CHOLESTEROL: 168 mg/dL (ref 0–200)
HDL: 64.5 mg/dL (ref >39.00)
LDL CALC: 82 mg/dL (ref 0–99)
NonHDL: 103.88
TRIGLYCERIDES: 108 mg/dL (ref 0.0–149.0)
Total CHOL/HDL Ratio: 3
VLDL: 21.6 mg/dL (ref 0.0–40.0)

## 2017-10-08 LAB — BASIC METABOLIC PANEL
BUN: 23 mg/dL (ref 6–23)
CHLORIDE: 102 meq/L (ref 96–112)
CO2: 30 meq/L (ref 19–32)
CREATININE: 0.6 mg/dL (ref 0.40–1.20)
Calcium: 9.6 mg/dL (ref 8.4–10.5)
GFR: 108.29 mL/min (ref >60.00)
GLUCOSE: 107 mg/dL — AB (ref 70–99)
Potassium: 4.4 mEq/L (ref 3.5–5.1)
Sodium: 140 mEq/L (ref 135–145)

## 2017-10-08 LAB — HEMOGLOBIN A1C: HEMOGLOBIN A1C: 5.8 % (ref 4.6–6.5)

## 2017-10-08 MED ORDER — BENZONATATE 200 MG PO CAPS: 200.0000 mg | ORAL_CAPSULE | Freq: Two times a day (BID) | ORAL | 1 refills | Status: DC | PRN

## 2017-10-08 MED ORDER — VALACYCLOVIR HCL 500 MG PO TABS: ORAL_TABLET | ORAL | 0 refills | Status: DC

## 2017-10-08 NOTE — Patient Instructions (Signed)
It was great seeing you today   I will follow up with you regarding your labs   Please let me know if you need anything

## 2017-10-08 NOTE — Progress Notes (Signed)
Subjective:    Patient ID: Rose Rose, female    DOB: December 23, 1955, 61 y.o.   MRN: [id number]  HPI  Patient presents for yearly preventative medicine examination. She is a pleasant 61 year old female who  has a past medical history of Arthritis, Complication of anesthesia, Depression, Hyperlipidemia, Hypertension, Insomnia, Pneumonia, and PONV (postoperative nausea and vomiting).  She takes Celexa and Valium for anxiety   She takes HCTZ for hypertension and lower extremity swelling   She takes Zocor do to history of hyperlipidemia   All immunizations and health maintenance protocols were reviewed with the patient and needed orders were placed.  Appropriate screening laboratory values were ordered for the patient including screening of hyperlipidemia, renal function and hepatic function.  Medication reconciliation,  past medical history, social history, problem list and allergies were reviewed in detail with the patient  Goals were established with regard to weight loss, exercise, and  diet in compliance with medications  She is due for a colonoscopy and mammogram. She participates in routine dental and vision screens. She is due for PAP which will be done today.   She continues to smoke a 1/4 pack to 1/2 pack daily. She does not want to quit    Review of Systems  Constitutional: Negative.   HENT: Negative.   Eyes: Negative.   Respiratory: Negative.   Cardiovascular: Negative.   Gastrointestinal: Negative.   Endocrine: Negative.   Genitourinary: Negative.   Musculoskeletal: Negative.   Skin: Negative.   Allergic/Immunologic: Negative.   Neurological: Negative.   Hematological: Negative.   Psychiatric/Behavioral: Negative.    Past Medical History:  Diagnosis Date  . Arthritis   . Complication of anesthesia   . Depression   . Hyperlipidemia   . Hypertension   . Insomnia   . Pneumonia   . PONV (postoperative nausea and vomiting)     Social History   Socioeconomic  History  . Marital status: Married    Spouse name: Not on file  . Number of children: Not on file  . Years of education: Not on file  . Highest education level: Not on file  Social Needs  . Financial resource strain: Not on file  . Food insecurity - worry: Not on file  . Food insecurity - inability: Not on file  . Transportation needs - medical: Not on file  . Transportation needs - non-medical: Not on file  Occupational History  . Not on file  Tobacco Use  . Smoking status: Current Some Day Smoker    Last attempt to quit: 10/22/2002    Years since quitting: 14.9  . Smokeless tobacco: Never Used  Substance and Sexual Activity  . Alcohol use: No  . Drug use: Yes    Types: Marijuana  . Sexual activity: Not on file  Other Topics Concern  . Not on file  Social History Narrative   Works at [employer] - works with [employer].    Married for 21 years   No children     Past Surgical History:  Procedure Laterality Date  . MYRINGOTOMY     Right ear  . TONSILLECTOMY    . 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 Maureen Ralphs  . TOTAL KNEE ARTHROPLASTY Left 01/16/2016   Procedure: TOTAL LEFT KNEE ARTHROPLASTY;  Surgeon: Gaynelle Arabian, MD;  Location: [facility];  Service: Orthopedics;  Laterality: Left;    Family History  Problem Relation Age of Onset  . Arthritis Mother   . Vision  loss Mother   . Dementia Father   . Parkinsonism Father   . Alzheimer's disease Maternal Aunt     Allergies  Allergen Reactions  . Amoxicillin-Pot Clavulanate     REACTION: swelling  Has patient had a PCN reaction causing immediate rash, facial/tongue/throat swelling, SOB or lightheadedness with hypotension: unk Has patient had a PCN reaction causing severe rash involving mucus membranes or skin necrosis: no Has patient had a PCN reaction that required hospitalization: unk Has patient had a PCN reaction occurring within the last 10 years: yes If all of the above answers are "NO", then may  proceed with Cephalosporin use.   . Augmentin [Amoxicillin-Pot Clavulanate] Itching and Swelling  . Oxycodone Hcl Nausea And Vomiting    Current Outpatient Medications on File Prior to Visit  Medication Sig Dispense Refill  . cetirizine (ZYRTEC) 10 MG tablet Take 1 tablet (10 mg total) by mouth daily. 30 tablet 0  . citalopram (CELEXA) 20 MG tablet TAKE 1 TABLET TWICE A DAY 180 tablet 1  . diazepam (VALIUM) 2 MG tablet Take 1 tablet (2 mg total) every 12 (twelve) hours as needed by mouth for anxiety. 180 tablet 0  . fluticasone (FLONASE) 50 MCG/ACT nasal spray Place 2 sprays into both nostrils daily as needed for allergies or rhinitis.     . hydrochlorothiazide (HYDRODIURIL) 25 MG tablet TAKE 1/2 TO 1 TABLET       (12.5MG  - 25MG ) DAILY 90 tablet 3  . meclizine (ANTIVERT) 25 MG tablet Take 1 tablet (25 mg total) by mouth 3 (three) times daily as needed for dizziness. 30 tablet 1  . olopatadine (PATANOL) 0.1 % ophthalmic solution Place 1 drop into both eyes daily as needed for allergies.     Marland Kitchen ondansetron (ZOFRAN) 4 MG tablet Take 1 tablet (4 mg total) by mouth every 8 (eight) hours as needed for nausea or vomiting. 20 tablet 0  . simvastatin (ZOCOR) 20 MG tablet TAKE 1 TABLET DAILY (Patient taking differently: TAKE one half TABLET (10 mg) daily at bedtime) 90 tablet 0  . simvastatin (ZOCOR) 20 MG tablet TAKE 1 TABLET DAILY 90 tablet 1  . simvastatin (ZOCOR) 20 MG tablet TAKE 1 TABLET DAILY 90 tablet 0  . Tapentadol HCl (NUCYNTA) 75 MG TABS Take 1 tablet (75 mg total) by mouth 2 (two) times daily. (Patient taking differently: Take 75 mg by mouth 2 (two) times daily as needed. ) 60 tablet 0  . tiZANidine (ZANAFLEX) 4 MG tablet TAKE 1 TABLET EVERY 8 HOURSAS NEEDED 270 tablet 1  . tiZANidine (ZANAFLEX) 4 MG tablet TAKE 1 TABLET EVERY 8 HOURSAS NEEDED 90 tablet 0   No current facility-administered medications on file prior to visit.     BP (!) 146/88 (BP Location: Left Arm)   Temp 98.8 F (37.1  C) (Oral)   Ht 5\' 2"  (1.575 m)   Wt 187 lb (84.8 kg)   BMI 34.20 kg/m       Objective:   Physical Exam  Constitutional: She is oriented to person, place, and time. She appears well-developed and well-nourished. No distress.  HENT:  Head: Normocephalic and atraumatic.  Right Ear: External ear normal.  Left Ear: External ear normal.  Nose: Nose normal.  Mouth/Throat: Oropharynx is clear and moist. No oropharyngeal exudate.  Eyes: Conjunctivae and EOM are normal. Pupils are equal, round, and reactive to light. Right eye exhibits no discharge. Left eye exhibits no discharge. No scleral icterus.  Neck: Normal range of motion. Neck supple.  No JVD present. Carotid bruit is not present. No tracheal deviation present. No thyroid mass and no thyromegaly present.  Cardiovascular: Normal rate, regular rhythm, normal heart sounds and intact distal pulses. Exam reveals no gallop and no friction rub.  No murmur heard. Pulmonary/Chest: Effort normal and breath sounds normal. No stridor. No respiratory distress. She has no wheezes. She has no rales. She exhibits no tenderness.  Abdominal: Soft. Bowel sounds are normal. She exhibits no distension and no mass. There is no tenderness. There is no rebound and no guarding.  Genitourinary: Vagina normal and uterus normal. No breast swelling, tenderness, discharge or bleeding. Cervix exhibits no motion tenderness and no friability. No vaginal discharge found.  Musculoskeletal: Normal range of motion. She exhibits no edema, tenderness or deformity.  Lymphadenopathy:    She has no cervical adenopathy.  Neurological: She is alert and oriented to person, place, and time. She has normal reflexes. She displays normal reflexes. No cranial nerve deficit. She exhibits normal muscle tone. Coordination normal.  Skin: Skin is warm and dry. No rash noted. She is not diaphoretic. No erythema. No pallor.  Psychiatric: She has a normal mood and affect. Her behavior is normal.  Judgment and thought content normal.  Nursing note and vitals reviewed.     Assessment & Plan:  1. Routine general medical examination at a health care facility - Encouraged to quit smoking  - Needs to work on weight loss through diet and exercise  - Follow up in one year or sooner if needed - She will call the breast center to make her appointment  - Basic metabolic panel - CBC with Differential/Platelet - Hepatic function panel - Hemoglobin A1c - Lipid panel - TSH - Hep C Antibody - HIV antibody - POCT Urinalysis Dipstick (Automated)  2. Mixed hyperlipidemia - Consider increasing Statin  - Basic metabolic panel - CBC with Differential/Platelet - Hepatic function panel - Hemoglobin A1c - Lipid panel - TSH  3. Essential hypertension -Elevated this morning. She took her medication PTA  - Basic metabolic panel - CBC with Differential/Platelet - Hepatic function panel - Hemoglobin A1c - Lipid panel - TSH  4. Adjustment disorder with anxious mood - Continue with current medications   5. Encounter for hepatitis C screening test for low risk patient  - Hep C Antibody  6. Medication refill  - valACYclovir (VALTREX) 500 MG tablet; TAKE 1 TABLET DAILY AS  NEEDED for fever blisters  Dispense: 90 tablet; Refill: 0  7. Colon cancer screening - Cologuard  8. Encounter for routine gynecological examination with Papanicolaou smear of cervix - PAP [[facility]]  Dorothyann Peng, NP

## 2017-10-09 LAB — HEPATITIS C ANTIBODY
HEP C AB: NONREACTIVE
SIGNAL TO CUT-OFF: 0.01 (ref <1.00)

## 2017-10-09 LAB — CYTOLOGY - PAP
Adequacy: ABSENT
Diagnosis: NEGATIVE
HPV: NOT DETECTED

## 2017-10-09 LAB — HIV ANTIBODY (ROUTINE TESTING W REFLEX): HIV: NONREACTIVE

## 2017-10-10 NOTE — Progress Notes (Signed)
Subjective:   Patient ID: Rose Rose, female   DOB: 61 y.o.   MRN: [id number]   HPI Rose Rose presents the office today for concerns of a lesion to the left fifth toe which is been ongoing for quite some time.  She thought that it was a callus however she said that she follow-up with her primary care physician was concerned that the area was a mole.  The area is painful with pressure in shoes.  She denies any redness or drainage or any swelling.  She did previously try to use a corn pad from the drugstore but does not help.  She denies any recent injury or trauma.  She has no other concerns today.   Review of Systems  All other systems reviewed and are negative.  Past Medical History:  Diagnosis Date  . Arthritis   . Complication of anesthesia   . Depression   . Hyperlipidemia   . Hypertension   . Insomnia   . Pneumonia   . PONV (postoperative nausea and vomiting)     Past Surgical History:  Procedure Laterality Date  . MYRINGOTOMY     Right ear  . TONSILLECTOMY    . 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 Maureen Ralphs  . TOTAL KNEE ARTHROPLASTY Left 01/16/2016   Procedure: TOTAL LEFT KNEE ARTHROPLASTY;  Surgeon: Gaynelle Arabian, MD;  Location: [facility];  Service: Orthopedics;  Laterality: Left;     Current Outpatient Medications:  .  benzonatate (TESSALON) 200 MG capsule, Take 1 capsule (200 mg total) by mouth 2 (two) times daily as needed for cough., Disp: 20 capsule, Rfl: 1 .  cetirizine (ZYRTEC) 10 MG tablet, Take 1 tablet (10 mg total) by mouth daily., Disp: 30 tablet, Rfl: 0 .  citalopram (CELEXA) 20 MG tablet, TAKE 1 TABLET TWICE A DAY, Disp: 180 tablet, Rfl: 1 .  diazepam (VALIUM) 2 MG tablet, Take 1 tablet (2 mg total) every 12 (twelve) hours as needed by mouth for anxiety., Disp: 180 tablet, Rfl: 0 .  fluticasone (FLONASE) 50 MCG/ACT nasal spray, Place 2 sprays into both nostrils daily as needed for allergies or rhinitis. , Disp: , Rfl:  .  hydrochlorothiazide (HYDRODIURIL)  25 MG tablet, TAKE 1/2 TO 1 TABLET       (12.5MG  - 25MG ) DAILY, Disp: 90 tablet, Rfl: 3 .  meclizine (ANTIVERT) 25 MG tablet, Take 1 tablet (25 mg total) by mouth 3 (three) times daily as needed for dizziness., Disp: 30 tablet, Rfl: 1 .  olopatadine (PATANOL) 0.1 % ophthalmic solution, Place 1 drop into both eyes daily as needed for allergies. , Disp: , Rfl:  .  ondansetron (ZOFRAN) 4 MG tablet, Take 1 tablet (4 mg total) by mouth every 8 (eight) hours as needed for nausea or vomiting., Disp: 20 tablet, Rfl: 0 .  simvastatin (ZOCOR) 20 MG tablet, TAKE 1 TABLET DAILY (Patient taking differently: TAKE one half TABLET (10 mg) daily at bedtime), Disp: 90 tablet, Rfl: 0 .  simvastatin (ZOCOR) 20 MG tablet, TAKE 1 TABLET DAILY, Disp: 90 tablet, Rfl: 1 .  simvastatin (ZOCOR) 20 MG tablet, TAKE 1 TABLET DAILY, Disp: 90 tablet, Rfl: 0 .  Tapentadol HCl (NUCYNTA) 75 MG TABS, Take 1 tablet (75 mg total) by mouth 2 (two) times daily. (Patient taking differently: Take 75 mg by mouth 2 (two) times daily as needed. ), Disp: 60 tablet, Rfl: 0 .  tiZANidine (ZANAFLEX) 4 MG tablet, TAKE 1 TABLET EVERY 8 HOURSAS NEEDED, Disp: 270 tablet,  Rfl: 1 .  tiZANidine (ZANAFLEX) 4 MG tablet, TAKE 1 TABLET EVERY 8 HOURSAS NEEDED, Disp: 90 tablet, Rfl: 0 .  valACYclovir (VALTREX) 500 MG tablet, TAKE 1 TABLET DAILY AS  NEEDED for fever blisters, Disp: 90 tablet, Rfl: 0  Allergies  Allergen Reactions  . Amoxicillin-Pot Clavulanate     REACTION: swelling  Has patient had a PCN reaction causing immediate rash, facial/tongue/throat swelling, SOB or lightheadedness with hypotension: unk Has patient had a PCN reaction causing severe rash involving mucus membranes or skin necrosis: no Has patient had a PCN reaction that required hospitalization: unk Has patient had a PCN reaction occurring within the last 10 years: yes If all of the above answers are "NO", then may proceed with Cephalosporin use.   . Augmentin [Amoxicillin-Pot  Clavulanate] Itching and Swelling  . Oxycodone Hcl Nausea And Vomiting    Social History   Socioeconomic History  . Marital status: Married    Spouse name: Not on file  . Number of children: Not on file  . Years of education: Not on file  . Highest education level: Not on file  Social Needs  . Financial resource strain: Not on file  . Food insecurity - worry: Not on file  . Food insecurity - inability: Not on file  . Transportation needs - medical: Not on file  . Transportation needs - non-medical: Not on file  Occupational History  . Not on file  Tobacco Use  . Smoking status: Current Some Day Smoker    Last attempt to quit: 10/22/2002    Years since quitting: 14.9  . Smokeless tobacco: Never Used  Substance and Sexual Activity  . Alcohol use: No  . Drug use: Yes    Types: Marijuana  . Sexual activity: Not on file  Other Topics Concern  . Not on file  Social History Narrative   Works at [employer] - works with [employer].    Married for 21 years   No children         Objective:  Physical Exam  General: AAO x3, NAD  Dermatological: Hyperkeratotic lesion present left medial fifth digit.  Upon debridement there is no underlying ulceration, drainage or any signs of infection.  There is no verruca.  There is no hyperpigmentation.  This area appears to be a hyperkeratotic lesion at the level of the PIPJ due to digital deformity.  There is no other underlying ulceration, drainage or signs of infection noted today.  Vascular: Dorsalis Pedis artery and Posterior Tibial artery pedal pulses are 2/4 bilateral with immedate capillary fill time. Pedal hair growth present. No varicosities and no lower extremity edema present bilateral. There is no pain with calf compression, swelling, warmth, erythema.   Neruologic: Grossly intact via light touch bilateral. Vibratory intact via tuning fork bilateral. Protective threshold with Semmes Wienstein monofilament intact to all  pedal sites bilateral.   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  Muscular strength 5/5 in all groups tested bilateral.  Gait: Unassisted, Nonantalgic.      Assessment:   Hyperkeratotic lesion left fifth toe without signs of infection    Plan:  -Treatment options discussed including all alternatives, risks, and complications -Etiology of symptoms were discussed -I was able to sharply debride quite a bit of hyperkeratotic tissue the left fifth toe today without any complications or bleeding. -We discussed ways both conservative and surgical to help take pressure off the toe.  We will continue conservative  treatment.  I dispensed further offloading pads.  Discussed the change in shoes as well.  Discussed likely recurrence the callus. -Follow-up as needed.  Should no further questions or concerns today.  Trula Slade DPM

## 2017-10-15 ENCOUNTER — Other Ambulatory Visit: Payer: Self-pay | Admitting: Adult Health

## 2017-10-16 ENCOUNTER — Encounter (HOSPITAL_COMMUNITY)
Admission: RE | Admit: 2017-10-16 | Discharge: 2017-10-16 | Disposition: A | Discharge status: Home / Self Care | Payer: BLUE CROSS/BLUE SHIELD | Source: Ambulatory Visit | Attending: Orthopedic Surgery | Admitting: Orthopedic Surgery

## 2017-10-16 DIAGNOSIS — Z96652 Presence of left artificial knee joint: Secondary | ICD-10-CM | POA: Diagnosis not present

## 2017-10-16 DIAGNOSIS — Z471 Aftercare following joint replacement surgery: Secondary | ICD-10-CM | POA: Insufficient documentation

## 2017-10-16 DIAGNOSIS — Z96651 Presence of right artificial knee joint: Secondary | ICD-10-CM | POA: Diagnosis not present

## 2017-10-16 MED ORDER — TECHNETIUM TC 99M MEBROFENIN IV KIT
5.0000 | PACK | Freq: Once | INTRAVENOUS | Status: DC | PRN
Start: 2017-10-16 — End: 2017-10-16

## 2017-10-16 MED ORDER — TECHNETIUM TC 99M MEDRONATE IV KIT
21.2000 | PACK | Freq: Once | INTRAVENOUS | Status: AC | PRN
  Administered 2017-10-16: 21.2 via INTRAVENOUS

## 2017-10-17 NOTE — Telephone Encounter (Signed)
Sent in on 09/26/17 #90.  Spoke to the pt.  She states she takes once daily and does not need refills at this time.  Medication denied.  She will notify me when additional refills are needed.

## 2017-10-30 ENCOUNTER — Other Ambulatory Visit: Payer: Self-pay | Admitting: Family

## 2017-10-30 DIAGNOSIS — Z1231 Encounter for screening mammogram for malignant neoplasm of breast: Secondary | ICD-10-CM

## 2017-10-31 ENCOUNTER — Encounter: Payer: Self-pay | Admitting: Podiatry

## 2017-10-31 ENCOUNTER — Ambulatory Visit (INDEPENDENT_AMBULATORY_CARE_PROVIDER_SITE_OTHER): Payer: BLUE CROSS/BLUE SHIELD

## 2017-10-31 ENCOUNTER — Ambulatory Visit: Payer: BLUE CROSS/BLUE SHIELD | Admitting: Podiatry

## 2017-10-31 DIAGNOSIS — Q828 Other specified congenital malformations of skin: Secondary | ICD-10-CM | POA: Diagnosis not present

## 2017-10-31 DIAGNOSIS — L84 Corns and callosities: Secondary | ICD-10-CM | POA: Diagnosis not present

## 2017-10-31 DIAGNOSIS — M205X9 Other deformities of toe(s) (acquired), unspecified foot: Secondary | ICD-10-CM | POA: Diagnosis not present

## 2017-10-31 NOTE — Progress Notes (Signed)
  Subjective: Rose Rose presents the office today for concerns of pain to the left toe which has been ongoing since before I saw her.  She states that she got minimal relief after her last appointment and the area still painful.  She denies any redness or drainage or any swelling to the area.  She states the skin lesion on the right foot was doing much better.  She had no complications after the acid treatment.  She has no new concerns. Denies any systemic complaints such as fevers, chills, nausea, vomiting. No acute changes since last appointment, and no other complaints at this time.   Objective: AAO x3, NAD DP/PT pulses palpable bilaterally, CRT less than 3 seconds Continue small annular hyperkeratotic lesion present along the left fifth digit on the medial distal aspect.  There is tenderness to palpation.  There is no surrounding erythema, drainage or pus or any clinical signs of infection noted.  Also skin lesion present on the right foot on the first metatarsal head.  This is doing much better than last appointment.  Noted it was not noted in the last clinic note that she had this but it was present last appointment and the area was cleaned and Silvadene was applied.  She tolerated this well any complications. 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  No pain with calf compression, swelling, warmth, erythema  Assessment: Skin lesions x2; digital deformity left fifth toe  Plan: -All treatment options discussed with the patient including all alternatives, risks, complications.  -X-rays were obtained and reviewed.  There is no large bone spur present the fifth digit.  I do think that the reason why she can hyperkeratotic tissue is still due to the position of the toe due to the mild adductovarus and pressure.  We discussed further surgical as well as conservative treatment.  I did dispense further offloading pads to the area and will also do one salinocaine  treatment.  Discussed that if she continues to have symptoms surgical intervention.  We discussed exostectomy and possible DIPJ arthroplasty given that with the deformity of the toe seems to be occurring.  She will consider options. -Today the hyperkeratotic lesions to bilateral feet were sharply debrided after the area of skin with alcohol without any complications or bleeding.  Pads were placed around both the lesions followed by Salinocaine in the occlusive bandage.  Post procedure instructions were discussed. -Follow-up as scheduled or sooner if needed.  She agrees this plan has no further questions or concerns -Patient encouraged to call the office with any questions, concerns, change in symptoms.   Trula Slade DPM

## 2017-11-06 ENCOUNTER — Other Ambulatory Visit: Payer: Self-pay | Admitting: Adult Health

## 2017-11-07 NOTE — Telephone Encounter (Signed)
Sent to the pharmacy by e-scribe. 

## 2017-11-19 ENCOUNTER — Encounter: Payer: Self-pay | Admitting: Podiatry

## 2017-11-19 ENCOUNTER — Ambulatory Visit (INDEPENDENT_AMBULATORY_CARE_PROVIDER_SITE_OTHER): Payer: BLUE CROSS/BLUE SHIELD

## 2017-11-19 ENCOUNTER — Ambulatory Visit: Payer: BLUE CROSS/BLUE SHIELD | Admitting: Podiatry

## 2017-11-19 DIAGNOSIS — M79675 Pain in left toe(s): Secondary | ICD-10-CM | POA: Diagnosis not present

## 2017-11-19 DIAGNOSIS — Q828 Other specified congenital malformations of skin: Secondary | ICD-10-CM

## 2017-11-19 DIAGNOSIS — M79672 Pain in left foot: Secondary | ICD-10-CM

## 2017-11-19 DIAGNOSIS — M205X9 Other deformities of toe(s) (acquired), unspecified foot: Secondary | ICD-10-CM | POA: Diagnosis not present

## 2017-11-19 NOTE — Patient Instructions (Signed)

## 2017-11-20 NOTE — Progress Notes (Signed)
Subjective: Rose Rose presents the office today for continued pain to her left fifth toe.  After I last saw her she states that a piece of skin did come up in between her toes and this did help but she continues to get pain on a daily basis.  She is also attempted offloading as well as shoe changes without any significant improvement.  She wishes to discuss surgical intervention today.  She has no other concerns today. Denies any systemic complaints such as fevers, chills, nausea, vomiting. No acute changes since last appointment, and no other complaints at this time.   Past Medical History:  Diagnosis Date  . Arthritis   . Complication of anesthesia   . Depression   . Hyperlipidemia   . Hypertension   . Insomnia   . Pneumonia   . PONV (postoperative nausea and vomiting)     Past Surgical History:  Procedure Laterality Date  . MYRINGOTOMY     Right ear  . TONSILLECTOMY    . 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 Maureen Ralphs  . TOTAL KNEE ARTHROPLASTY Left 01/16/2016   Procedure: TOTAL LEFT KNEE ARTHROPLASTY;  Surgeon: Gaynelle Arabian, MD;  Location: [facility];  Service: Orthopedics;  Laterality: Left;     Current Outpatient Medications:  .  benzonatate (TESSALON) 200 MG capsule, Take 1 capsule (200 mg total) by mouth 2 (two) times daily as needed for cough., Disp: 20 capsule, Rfl: 1 .  cetirizine (ZYRTEC) 10 MG tablet, Take 1 tablet (10 mg total) by mouth daily., Disp: 30 tablet, Rfl: 0 .  citalopram (CELEXA) 20 MG tablet, TAKE 1 TABLET TWICE A DAY, Disp: 180 tablet, Rfl: 1 .  diazepam (VALIUM) 2 MG tablet, Take 1 tablet (2 mg total) every 12 (twelve) hours as needed by mouth for anxiety., Disp: 180 tablet, Rfl: 0 .  fluticasone (FLONASE) 50 MCG/ACT nasal spray, Place 2 sprays into both nostrils daily as needed for allergies or rhinitis. , Disp: , Rfl:  .  hydrochlorothiazide (HYDRODIURIL) 25 MG tablet, TAKE 1/2 TO 1 TABLET       (12.5MG  - 25MG ) DAILY, Disp: 90 tablet, Rfl: 3 .   hydrochlorothiazide (HYDRODIURIL) 25 MG tablet, TAKE 1/2 TO 1 TABLET       (12.5MG  - 25MG ) DAILY, Disp: 90 tablet, Rfl: 3 .  meclizine (ANTIVERT) 25 MG tablet, Take 1 tablet (25 mg total) by mouth 3 (three) times daily as needed for dizziness., Disp: 30 tablet, Rfl: 1 .  olopatadine (PATANOL) 0.1 % ophthalmic solution, Place 1 drop into both eyes daily as needed for allergies. , Disp: , Rfl:  .  ondansetron (ZOFRAN) 4 MG tablet, Take 1 tablet (4 mg total) by mouth every 8 (eight) hours as needed for nausea or vomiting., Disp: 20 tablet, Rfl: 0 .  simvastatin (ZOCOR) 20 MG tablet, TAKE 1 TABLET DAILY (Patient taking differently: TAKE one half TABLET (10 mg) daily at bedtime), Disp: 90 tablet, Rfl: 0 .  simvastatin (ZOCOR) 20 MG tablet, TAKE 1 TABLET DAILY, Disp: 90 tablet, Rfl: 1 .  simvastatin (ZOCOR) 20 MG tablet, TAKE 1 TABLET DAILY, Disp: 90 tablet, Rfl: 0 .  Tapentadol HCl (NUCYNTA) 75 MG TABS, Take 1 tablet (75 mg total) by mouth 2 (two) times daily. (Patient taking differently: Take 75 mg by mouth 2 (two) times daily as needed. ), Disp: 60 tablet, Rfl: 0 .  tiZANidine (ZANAFLEX) 4 MG tablet, TAKE 1 TABLET EVERY 8 HOURSAS NEEDED, Disp: 270 tablet, Rfl: 1 .  tiZANidine (ZANAFLEX)  4 MG tablet, TAKE 1 TABLET EVERY 8 HOURSAS NEEDED, Disp: 90 tablet, Rfl: 0 .  valACYclovir (VALTREX) 500 MG tablet, TAKE 1 TABLET DAILY AS  NEEDED for fever blisters, Disp: 90 tablet, Rfl: 0  Allergies  Allergen Reactions  . Amoxicillin-Pot Clavulanate     REACTION: swelling  Has patient had a PCN reaction causing immediate rash, facial/tongue/throat swelling, SOB or lightheadedness with hypotension: unk Has patient had a PCN reaction causing severe rash involving mucus membranes or skin necrosis: no Has patient had a PCN reaction that required hospitalization: unk Has patient had a PCN reaction occurring within the last 10 years: yes If all of the above answers are "NO", then may proceed with Cephalosporin use.    . Augmentin [Amoxicillin-Pot Clavulanate] Itching and Swelling  . Oxycodone Hcl Nausea And Vomiting    Social History   Socioeconomic History  . Marital status: Married    Spouse name: Not on file  . Number of children: Not on file  . Years of education: Not on file  . Highest education level: Not on file  Social Needs  . Financial resource strain: Not on file  . Food insecurity - worry: Not on file  . Food insecurity - inability: Not on file  . Transportation needs - medical: Not on file  . Transportation needs - non-medical: Not on file  Occupational History  . Not on file  Tobacco Use  . Smoking status: Current Some Day Smoker    Last attempt to quit: 10/22/2002    Years since quitting: 15.0  . Smokeless tobacco: Never Used  Substance and Sexual Activity  . Alcohol use: No  . Drug use: Yes    Types: Marijuana  . Sexual activity: Not on file  Other Topics Concern  . Not on file  Social History Narrative   Works at [employer] - works with [employer].    Married for 21 years   No children      Objective: AAO x3, NAD DP/PT pulses palpable bilaterally, CRT less than 3 seconds Continue small annular hyperkeratotic lesion present along the left fifth digit on the medial distal aspect.  There is tenderness to palpation.  There is no surrounding erythema, drainage or pus or any clinical signs of infection noted.  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  No pain with calf compression, swelling, warmth, erythema  Assessment: Digital deformity left fifth toe resulted in hyperkeratotic lesion  Plan: -All treatment options discussed with the patient including all alternatives, risks, complications.  -I took a new x-ray today and the skin marker was utilized to identify the area of skin lesion.  No large bony exostosis present. -We discussed both conservative as well as surgical treatment options. -At this point given her  continuation symptoms despite conservative treatment she wishes to proceed with surgical intervention.  I discussed with her exostectomy off of the medial distal phalanx.  Also given the deformity I like to do it IPJ arthroplasty at the digit to help rotate the toe to help prevent reoccurrence.  I discussed with her the surgery as well as the postoperative course and success rates.  At this point she wishes to proceed with the surgery. -The incision placement as well as the postoperative course was discussed with the patient. I discussed risks of the surgery which include, but not limited to, infection, bleeding, pain, swelling, need for further surgery, delayed or nonhealing, painful or ugly scar, numbness or  sensation changes, over/under correction, recurrence, transfer lesions, further deformity, hardware failure, DVT/PE, loss of toe/foot. Patient understands these risks and wishes to proceed with surgery. The surgical consent was reviewed with the patient all 3 pages were signed. No promises or guarantees were given to the outcome of the procedure. All questions were answered to the best of my ability. Before the surgery the patient was encouraged to call the office if there is any further questions. The surgery will be performed at the [facility] on an outpatient basis. -She will she was dispensed for postoperative course.  Trula Slade DPM

## 2017-11-21 DIAGNOSIS — Z471 Aftercare following joint replacement surgery: Secondary | ICD-10-CM | POA: Diagnosis not present

## 2017-11-21 DIAGNOSIS — Z96653 Presence of artificial knee joint, bilateral: Secondary | ICD-10-CM | POA: Insufficient documentation

## 2017-11-25 ENCOUNTER — Ambulatory Visit: Payer: BLUE CROSS/BLUE SHIELD

## 2017-11-29 ENCOUNTER — Telehealth: Payer: Self-pay | Admitting: Podiatry

## 2017-11-29 NOTE — Telephone Encounter (Signed)
I called and left the patient a message of Dr. Leigh Rose response.  I asked her to call if she has any further questions.

## 2017-11-29 NOTE — Telephone Encounter (Signed)
Pt called and said she was told by surgical schedule she could be out of work for up to 6 wks and she does not remember Dr W telling her this. She is trying to let her work know the length of time she maybe out. Please advise

## 2017-11-29 NOTE — Telephone Encounter (Signed)
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 We typically write out for longer but can go back sooner.

## 2017-12-04 ENCOUNTER — Encounter: Payer: Self-pay | Admitting: Podiatry

## 2017-12-04 ENCOUNTER — Telehealth: Payer: Self-pay | Admitting: *Deleted

## 2017-12-04 DIAGNOSIS — M216X2 Other acquired deformities of left foot: Secondary | ICD-10-CM | POA: Diagnosis not present

## 2017-12-04 DIAGNOSIS — M7732 Calcaneal spur, left foot: Secondary | ICD-10-CM | POA: Diagnosis not present

## 2017-12-04 DIAGNOSIS — E7800 Pure hypercholesterolemia, unspecified: Secondary | ICD-10-CM | POA: Diagnosis not present

## 2017-12-04 DIAGNOSIS — M2042 Other hammer toe(s) (acquired), left foot: Secondary | ICD-10-CM | POA: Diagnosis not present

## 2017-12-04 NOTE — Telephone Encounter (Signed)
I spoke with Alease - [facility], she states can fill as Vicodin 5/325mg  1 or 2 tablets every 4-6 hours max 10 tablets a day. I told her that would be fine.

## 2017-12-04 NOTE — Telephone Encounter (Signed)
Rose Rose - [facility] states pt's vicodin has been written for 85mEq of morphine and under their new rules must be 23mEq, they would like permission to change.

## 2017-12-05 ENCOUNTER — Telehealth: Payer: Self-pay | Admitting: *Deleted

## 2017-12-05 MED ORDER — HYDROMORPHONE HCL 2 MG PO TABS: ORAL_TABLET | ORAL | 0 refills | Status: DC

## 2017-12-05 NOTE — Telephone Encounter (Addendum)
Dr. Paulla Dolly ordered Dilaudid 2mg  #15 one tablet every 4-6 hours prn foot pain. I informed pt we would need her to bring the vicodin to be destroyed and pick up the rx in the [place] office to be taken to the pharmacy.

## 2017-12-05 NOTE — Telephone Encounter (Signed)
Rose Rose - [facility] states he has received a prescription very similar to the prescription filled yesterday, was there anything wrong with the 1st rx. I told Rose Rose, pt had paradoxical reaction of anxiety and our doctor changed to dilaudid which pt states she did not have a reaction to with previous use.

## 2017-12-05 NOTE — Telephone Encounter (Signed)
I told pt that with some people and some narcotics there can be a paradoxical reaction of anxiety, rather than the relaxation/sedation effect, but could still have pain relief. I told pt to take benadryl for the itching. I told pt I would ask the doctor on call what medication could be changed to, but if she could tolerate ibuprofen she could take OTC as the package directs. Pt states she doesn't take ibuprofen it has too many side effects, and can't take Oxycodone, but Dr. Wynelle Link gave her Dilaudid for her knee surgery and she did not have any problems. I told her I would check with a doctor and call again.

## 2017-12-05 NOTE — Telephone Encounter (Signed)
Pt states she thinks she may be having a reaction to the pain medication she is very anxious and itchy.

## 2017-12-05 NOTE — Telephone Encounter (Signed)
Called and spoke with patient and patient stated that she was doing fine but had a lot of itching and talked to [place] and my husband is going to come and bring the pain medicine I was taken and get another RX to take and the patch behind my ear is helping with the nausea and I have no fever,chills,nausea and to call the office if any concerns or questions and the patient stated that she was working on the short term disability as well. Lattie Haw

## 2017-12-09 ENCOUNTER — Ambulatory Visit (INDEPENDENT_AMBULATORY_CARE_PROVIDER_SITE_OTHER): Payer: BLUE CROSS/BLUE SHIELD

## 2017-12-09 ENCOUNTER — Ambulatory Visit (INDEPENDENT_AMBULATORY_CARE_PROVIDER_SITE_OTHER): Payer: BLUE CROSS/BLUE SHIELD | Admitting: Podiatry

## 2017-12-09 ENCOUNTER — Encounter: Payer: Self-pay | Admitting: Podiatry

## 2017-12-09 VITALS — BP 145/86 | HR 55 | Temp 98.3°F

## 2017-12-09 DIAGNOSIS — M205X9 Other deformities of toe(s) (acquired), unspecified foot: Secondary | ICD-10-CM

## 2017-12-09 DIAGNOSIS — Q828 Other specified congenital malformations of skin: Secondary | ICD-10-CM | POA: Diagnosis not present

## 2017-12-09 DIAGNOSIS — Z9889 Other specified postprocedural states: Secondary | ICD-10-CM

## 2017-12-10 ENCOUNTER — Other Ambulatory Visit: Payer: Self-pay | Admitting: Adult Health

## 2017-12-10 NOTE — Telephone Encounter (Signed)
Sent to the pharmacy by e-scribe. 

## 2017-12-11 ENCOUNTER — Ambulatory Visit
Admission: RE | Admit: 2017-12-11 | Discharge: 2017-12-11 | Disposition: A | Discharge status: Home / Self Care | Payer: BLUE CROSS/BLUE SHIELD | Source: Ambulatory Visit | Attending: Family | Admitting: Family

## 2017-12-11 DIAGNOSIS — Z1231 Encounter for screening mammogram for malignant neoplasm of breast: Secondary | ICD-10-CM

## 2017-12-11 NOTE — Progress Notes (Signed)
Subjective: Rose Rose is a 62 y.o. is seen today in office s/p left 5th digit hammertoe repair preformed on 12/04/2017. She states her pain is minimal and she is doing well. She has not had to take the pain medication recently. We did have to change her to dilaudid due to reaction. Denies any systemic complaints such as fevers, chills, nausea, vomiting. No calf pain, chest pain, shortness of breath.   Objective: General: No acute distress, AAOx3  DP/PT pulses palpable 2/4, CRT < 3 sec to all digits.  Protective sensation intact. Motor function intact.  LEFT foot: Incision is well coapted without any evidence of dehiscence and sutures are intact. There is no surrounding erythema, ascending cellulitis, fluctuance, crepitus, malodor, drainage/purulence. There is mild edema around the surgical site. There is no significant pain along the surgical site. The toe is in a rectus position.  No other areas of tenderness to bilateral lower extremities.  No other open lesions or pre-ulcerative lesions.  No pain with calf compression, swelling, warmth, erythema.   Assessment and Plan:  Status post left 5th toe surgery, doing well with no complications   -Treatment options discussed including all alternatives, risks, and complications -X-rays were obtained and reviewed with the patient. Status post arthroplasty of the toe without any evidence of acute fracture. -Antibiotic ointment was applied followed by a dressing. Keep clean, dry, intact.  -Remain in surgical shoe at all times. -Ice/elevation -Pain medication as needed. -Monitor for any clinical signs or symptoms of infection and DVT/PE and directed to call the office immediately should any occur or go to the ER. -Follow-up in 1 week or sooner if any problems arise. In the meantime, encouraged to call the office with any questions, concerns, change in symptoms.   Celesta Gentile, DPM

## 2017-12-12 ENCOUNTER — Ambulatory Visit: Payer: BLUE CROSS/BLUE SHIELD | Admitting: Adult Health

## 2017-12-12 ENCOUNTER — Encounter: Payer: Self-pay | Admitting: Adult Health

## 2017-12-12 VITALS — BP 118/80 | Temp 98.2°F | Wt 185.0 lb

## 2017-12-12 DIAGNOSIS — H9201 Otalgia, right ear: Secondary | ICD-10-CM | POA: Diagnosis not present

## 2017-12-12 NOTE — Progress Notes (Signed)
Subjective:    Patient ID: Rose Rose, female    DOB: 12-02-1955, 62 y.o.   MRN: [id number]  HPI  62 year old female who  has a past medical history of Arthritis, Complication of anesthesia, Depression, Hyperlipidemia, Hypertension, Insomnia, Pneumonia, and PONV (postoperative nausea and vomiting). Resents to the office with 2-3 days of right-sided ear pain that has been becoming progressively worse.  She denies any drainage, redness, or tinnitus.  He does endorse some associated nasal congestion  Review of Systems See HPI   Past Medical History:  Diagnosis Date  . Arthritis   . Complication of anesthesia   . Depression   . Hyperlipidemia   . Hypertension   . Insomnia   . Pneumonia   . PONV (postoperative nausea and vomiting)     Social History   Socioeconomic History  . Marital status: Married    Spouse name: Not on file  . Number of children: Not on file  . Years of education: Not on file  . Highest education level: Not on file  Social Needs  . Financial resource strain: Not on file  . Food insecurity - worry: Not on file  . Food insecurity - inability: Not on file  . Transportation needs - medical: Not on file  . Transportation needs - non-medical: Not on file  Occupational History  . Not on file  Tobacco Use  . Smoking status: Current Some Day Smoker    Last attempt to quit: 10/22/2002    Years since quitting: 15.1  . Smokeless tobacco: Never Used  Substance and Sexual Activity  . Alcohol use: No  . Drug use: Yes    Types: Marijuana  . Sexual activity: Not on file  Other Topics Concern  . Not on file  Social History Narrative   Works at [employer] - works with [employer].    Married for 21 years   No children     Past Surgical History:  Procedure Laterality Date  . MYRINGOTOMY     Right ear  . TONSILLECTOMY    . 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 Maureen Ralphs  . TOTAL KNEE ARTHROPLASTY Left 01/16/2016   Procedure: TOTAL LEFT KNEE  ARTHROPLASTY;  Surgeon: Gaynelle Arabian, MD;  Location: [facility];  Service: Orthopedics;  Laterality: Left;    Family History  Problem Relation Age of Onset  . Arthritis Mother   . Vision loss Mother   . Dementia Father   . Parkinsonism Father   . Alzheimer's disease Maternal Aunt     Allergies  Allergen Reactions  . Amoxicillin-Pot Clavulanate     REACTION: swelling  Has patient had a PCN reaction causing immediate rash, facial/tongue/throat swelling, SOB or lightheadedness with hypotension: unk Has patient had a PCN reaction causing severe rash involving mucus membranes or skin necrosis: no Has patient had a PCN reaction that required hospitalization: unk Has patient had a PCN reaction occurring within the last 10 years: yes If all of the above answers are "NO", then may proceed with Cephalosporin use.   . Augmentin [Amoxicillin-Pot Clavulanate] Itching and Swelling  . Other   . Oxycodone Hcl Nausea And Vomiting  . Vicodin [Hydrocodone-Acetaminophen] Anxiety    itching    Current Outpatient Medications on File Prior to Visit  Medication Sig Dispense Refill  . cetirizine (ZYRTEC) 10 MG tablet Take 1 tablet (10 mg total) by mouth daily. 30 tablet 0  . citalopram (CELEXA) 20 MG tablet TAKE 1 TABLET TWICE  A DAY 180 tablet 1  . clindamycin (CLEOCIN) 150 MG capsule     . diazepam (VALIUM) 2 MG tablet Take 1 tablet (2 mg total) every 12 (twelve) hours as needed by mouth for anxiety. 180 tablet 0  . fluticasone (FLONASE) 50 MCG/ACT nasal spray Place 2 sprays into both nostrils daily as needed for allergies or rhinitis.     . hydrochlorothiazide (HYDRODIURIL) 25 MG tablet TAKE 1/2 TO 1 TABLET       (12.5MG  - 25MG ) DAILY 90 tablet 3  . HYDROmorphone (DILAUDID) 2 MG tablet Take 1 tablet every 4 - 6 hours prn foot pain. 15 tablet 0  . meclizine (ANTIVERT) 25 MG tablet Take 1 tablet (25 mg total) by mouth 3 (three) times daily as needed for dizziness. 30 tablet 1  . olopatadine (PATANOL) 0.1  % ophthalmic solution Place 1 drop into both eyes daily as needed for allergies.     Marland Kitchen ondansetron (ZOFRAN) 4 MG tablet Take 1 tablet (4 mg total) by mouth every 8 (eight) hours as needed for nausea or vomiting. 20 tablet 0  . promethazine (PHENERGAN) 25 MG tablet     . simvastatin (ZOCOR) 20 MG tablet TAKE 1 TABLET DAILY (Patient taking differently: TAKE one half TABLET (10 mg) daily at bedtime) 90 tablet 0  . Tapentadol HCl (NUCYNTA) 75 MG TABS Take 1 tablet (75 mg total) by mouth 2 (two) times daily. (Patient taking differently: Take 75 mg by mouth 2 (two) times daily as needed. ) 60 tablet 0  . tiZANidine (ZANAFLEX) 4 MG tablet TAKE 1 TABLET EVERY 8 HOURSAS NEEDED 270 tablet 1  . valACYclovir (VALTREX) 500 MG tablet TAKE 1 TABLET DAILY AS  NEEDED for fever blisters 90 tablet 0   No current facility-administered medications on file prior to visit.     BP 118/80 (BP Location: Left Arm)   Temp 98.2 F (36.8 C) (Oral)   Wt 185 lb (83.9 kg)   BMI 33.84 kg/m       Objective:   Physical Exam  Constitutional: She is oriented to person, place, and time. She appears well-developed and well-nourished. No distress.  HENT:  Right Ear: External ear and ear canal normal. Tympanic membrane is scarred. Tympanic membrane is not erythematous.  Left Ear: Tympanic membrane is scarred.  There is tenderness to the helix, tragus, and external auditory meatus light palpation.  TM is gray but scarred from previous ear trauma.  There does not appear to be any redness in the auditory canal.  But does have what appears to be a cyst on the lower left side of the auditory canal.  Mastoid area is tender with palpation but there is no redness or apparent cellulitic event present.  Eyes: Conjunctivae and EOM are normal. Pupils are equal, round, and reactive to light. Right eye exhibits no discharge. Left eye exhibits no discharge. No scleral icterus.  Cardiovascular: Normal rate, normal heart sounds and intact distal  pulses.  Pulmonary/Chest: Effort normal and breath sounds normal. No respiratory distress. She has no wheezes. She has no rales. She exhibits no tenderness.  Neurological: She is alert and oriented to person, place, and time.  Skin: Skin is warm and dry. No rash noted. She is not diaphoretic. No erythema. No pallor.  Psychiatric: She has a normal mood and affect. Her behavior is normal. Judgment and thought content normal.  Nursing note and vitals reviewed.     Assessment & Plan:  1. Right ear pain -Does not appear  as otitis media.  Possible beginning of mastoiditis.  We can refer her to ear nose throat for further evaluation - Ambulatory referral to ENT  Dorothyann Peng, NP

## 2017-12-12 NOTE — Progress Notes (Signed)
Subjective:    Patient ID: Rose Rose, female    DOB: 27-May-1956, 61 y.o.   MRN: [id number]  HPI  Presents with 2-3 day history of right external ear pain radiating into her right jaw and neck.  She also has increased amount of nasal congestion compared to her norm.  Some coughing but not different than usual.  She denies fever but has had some chills.  She has been taking her Flonase but nothing for the pain.   Review of Systems  HENT: Positive for congestion and ear pain. Negative for ear discharge, postnasal drip, rhinorrhea, sinus pressure, sinus pain, sneezing and sore throat.   Respiratory: Positive for cough. Negative for chest tightness, shortness of breath and wheezing.   Cardiovascular: Negative for chest pain and palpitations.  Neurological: Negative for headaches.   Past Medical History:  Diagnosis Date  . Arthritis   . Complication of anesthesia   . Depression   . Hyperlipidemia   . Hypertension   . Insomnia   . Pneumonia   . PONV (postoperative nausea and vomiting)     Social History   Socioeconomic History  . Marital status: Married    Spouse name: Not on file  . Number of children: Not on file  . Years of education: Not on file  . Highest education level: Not on file  Social Needs  . Financial resource strain: Not on file  . Food insecurity - worry: Not on file  . Food insecurity - inability: Not on file  . Transportation needs - medical: Not on file  . Transportation needs - non-medical: Not on file  Occupational History  . Not on file  Tobacco Use  . Smoking status: Current Some Day Smoker    Last attempt to quit: 10/22/2002    Years since quitting: 15.1  . Smokeless tobacco: Never Used  Substance and Sexual Activity  . Alcohol use: No  . Drug use: Yes    Types: Marijuana  . Sexual activity: Not on file  Other Topics Concern  . Not on file  Social History Narrative   Works at [employer] - works with [employer].    Married for 21 years    No children     Past Surgical History:  Procedure Laterality Date  . MYRINGOTOMY     Right ear  . TONSILLECTOMY    . 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 Maureen Ralphs  . TOTAL KNEE ARTHROPLASTY Left 01/16/2016   Procedure: TOTAL LEFT KNEE ARTHROPLASTY;  Surgeon: Gaynelle Arabian, MD;  Location: [facility];  Service: Orthopedics;  Laterality: Left;    Family History  Problem Relation Age of Onset  . Arthritis Mother   . Vision loss Mother   . Dementia Father   . Parkinsonism Father   . Alzheimer's disease Maternal Aunt     Allergies  Allergen Reactions  . Amoxicillin-Pot Clavulanate     REACTION: swelling  Has patient had a PCN reaction causing immediate rash, facial/tongue/throat swelling, SOB or lightheadedness with hypotension: unk Has patient had a PCN reaction causing severe rash involving mucus membranes or skin necrosis: no Has patient had a PCN reaction that required hospitalization: unk Has patient had a PCN reaction occurring within the last 10 years: yes If all of the above answers are "NO", then may proceed with Cephalosporin use.   . Augmentin [Amoxicillin-Pot Clavulanate] Itching and Swelling  . Other   . Oxycodone Hcl Nausea And Vomiting  . Vicodin [Hydrocodone-Acetaminophen] Anxiety  itching    Current Outpatient Medications on File Prior to Visit  Medication Sig Dispense Refill  . cetirizine (ZYRTEC) 10 MG tablet Take 1 tablet (10 mg total) by mouth daily. 30 tablet 0  . citalopram (CELEXA) 20 MG tablet TAKE 1 TABLET TWICE A DAY 180 tablet 1  . clindamycin (CLEOCIN) 150 MG capsule     . diazepam (VALIUM) 2 MG tablet Take 1 tablet (2 mg total) every 12 (twelve) hours as needed by mouth for anxiety. 180 tablet 0  . fluticasone (FLONASE) 50 MCG/ACT nasal spray Place 2 sprays into both nostrils daily as needed for allergies or rhinitis.     . hydrochlorothiazide (HYDRODIURIL) 25 MG tablet TAKE 1/2 TO 1 TABLET       (12.5MG  - 25MG ) DAILY 90 tablet 3  .  HYDROmorphone (DILAUDID) 2 MG tablet Take 1 tablet every 4 - 6 hours prn foot pain. 15 tablet 0  . meclizine (ANTIVERT) 25 MG tablet Take 1 tablet (25 mg total) by mouth 3 (three) times daily as needed for dizziness. 30 tablet 1  . olopatadine (PATANOL) 0.1 % ophthalmic solution Place 1 drop into both eyes daily as needed for allergies.     Marland Kitchen ondansetron (ZOFRAN) 4 MG tablet Take 1 tablet (4 mg total) by mouth every 8 (eight) hours as needed for nausea or vomiting. 20 tablet 0  . promethazine (PHENERGAN) 25 MG tablet     . simvastatin (ZOCOR) 20 MG tablet TAKE 1 TABLET DAILY (Patient taking differently: TAKE one half TABLET (10 mg) daily at bedtime) 90 tablet 0  . Tapentadol HCl (NUCYNTA) 75 MG TABS Take 1 tablet (75 mg total) by mouth 2 (two) times daily. (Patient taking differently: Take 75 mg by mouth 2 (two) times daily as needed. ) 60 tablet 0  . tiZANidine (ZANAFLEX) 4 MG tablet TAKE 1 TABLET EVERY 8 HOURSAS NEEDED 270 tablet 1  . valACYclovir (VALTREX) 500 MG tablet TAKE 1 TABLET DAILY AS  NEEDED for fever blisters 90 tablet 0   No current facility-administered medications on file prior to visit.     BP 118/80 (BP Location: Left Arm)   Temp 98.2 F (36.8 C) (Oral)   Wt 185 lb (83.9 kg)   BMI 33.84 kg/m      Objective:   Physical Exam  Constitutional: She appears well-developed and well-nourished. No distress.  HENT:  Right Ear: There is swelling. No drainage. Tympanic membrane is scarred. Tympanic membrane is not perforated and not erythematous.  Left Ear: External ear normal. Tympanic membrane is scarred. Tympanic membrane is not perforated and not erythematous.  Ears:  Nose: Mucosal edema present. No rhinorrhea, sinus tenderness or septal deviation. Right sinus exhibits no maxillary sinus tenderness and no frontal sinus tenderness. Left sinus exhibits no maxillary sinus tenderness and no frontal sinus tenderness.  Mouth/Throat: Uvula is midline, oropharynx is clear and moist  and mucous membranes are normal. No oral lesions. No dental abscesses, uvula swelling or lacerations. No oropharyngeal exudate, posterior oropharyngeal edema, posterior oropharyngeal erythema or tonsillar abscesses.  Bilateral nasal turbinates are erythematous   Neck: Normal range of motion.  Cardiovascular: Normal rate, regular rhythm and normal heart sounds. Exam reveals no gallop and no friction rub.  No murmur heard. Pulmonary/Chest: Effort normal and breath sounds normal. No respiratory distress. She has no wheezes. She has no rales.  Lymphadenopathy:    She has cervical adenopathy.  Nursing note and vitals reviewed.     Assessment & Plan:

## 2017-12-13 DIAGNOSIS — H60333 Swimmer's ear, bilateral: Secondary | ICD-10-CM | POA: Diagnosis not present

## 2017-12-13 DIAGNOSIS — H6123 Impacted cerumen, bilateral: Secondary | ICD-10-CM | POA: Diagnosis not present

## 2017-12-16 ENCOUNTER — Ambulatory Visit (INDEPENDENT_AMBULATORY_CARE_PROVIDER_SITE_OTHER): Payer: BLUE CROSS/BLUE SHIELD | Admitting: Podiatry

## 2017-12-16 ENCOUNTER — Encounter: Payer: Self-pay | Admitting: Podiatry

## 2017-12-16 DIAGNOSIS — M205X9 Other deformities of toe(s) (acquired), unspecified foot: Secondary | ICD-10-CM

## 2017-12-16 DIAGNOSIS — M79676 Pain in unspecified toe(s): Secondary | ICD-10-CM

## 2017-12-16 DIAGNOSIS — Z9889 Other specified postprocedural states: Secondary | ICD-10-CM

## 2017-12-18 DIAGNOSIS — H6123 Impacted cerumen, bilateral: Secondary | ICD-10-CM | POA: Diagnosis not present

## 2017-12-18 DIAGNOSIS — H60333 Swimmer's ear, bilateral: Secondary | ICD-10-CM | POA: Diagnosis not present

## 2017-12-18 NOTE — Progress Notes (Signed)
Subjective: Rose Rose is a 62 y.o. is seen today in office s/p left 5th digit hammertoe repair preformed on 12/04/2017.  She states that she is doing well she has no pain.  She is remaining surgical shoe.  She did try to ice and elevate as much as possible.  She is not taking any pain medication.  So far she states that she is very happy with the surgery.  She has no other concerns today.  Denies any systemic complaints such as fevers, chills, nausea, vomiting. No calf pain, chest pain, shortness of breath.   Objective: General: No acute distress, AAOx3  DP/PT pulses palpable 2/4, CRT < 3 sec to all digits.  Protective sensation intact. Motor function intact.  LEFT foot: Incision is well coapted without any evidence of dehiscence and sutures are intact.  There is minimal motion across the incision.  There is no surrounding erythema and there is no ascending cellulitis.  There is not appear to be any areas of fluctuation or crepitation there is no clinical signs of infection.  Mild swelling to the toe.  No tenderness palpation of the toe.  The toes and rectus position. No other open lesions or pre-ulcerative lesions.  No pain with calf compression, swelling, warmth, erythema.   Assessment and Plan:  Status post left 5th toe surgery, doing well with no complications   -Treatment options discussed including all alternatives, risks, and complications -There is mild motion across the incisions therefore left the sutures intact today she also agreed not to take them out.  Antibiotic ointment was applied followed by a bandage.  Keep the dressing clean, dry, intact.  Continue ice and elevation and remain in surgical shoe.  Monitor for any signs or symptoms of infection.  I will see her back in about 1 week for suture removal sooner if any issues are to arise.  Trula Slade DPM

## 2017-12-23 ENCOUNTER — Encounter: Payer: Self-pay | Admitting: Podiatry

## 2017-12-23 ENCOUNTER — Ambulatory Visit (INDEPENDENT_AMBULATORY_CARE_PROVIDER_SITE_OTHER): Payer: BLUE CROSS/BLUE SHIELD | Admitting: Podiatry

## 2017-12-23 VITALS — BP 128/76 | HR 64 | Resp 16

## 2017-12-23 DIAGNOSIS — M205X9 Other deformities of toe(s) (acquired), unspecified foot: Secondary | ICD-10-CM

## 2017-12-23 DIAGNOSIS — Q828 Other specified congenital malformations of skin: Secondary | ICD-10-CM

## 2017-12-23 NOTE — Progress Notes (Signed)
Subjective: Rose Rose is a 62 y.o. is seen today in office s/p left 5th digit hammertoe repair preformed on 12/04/2017.  States that she has had some mild increase in pain but she states that she is also been more active on her foot this is likely what caused it.  She presents today for suture removal.  Denies any recent injury or trauma.  She has remained in the surgical shoe.  She has no other concerns.  She is scheduled for arthroscopic knee surgery tomorrow. Denies any systemic complaints such as fevers, chills, nausea, vomiting. No calf pain, chest pain, shortness of breath.   Objective: General: No acute distress, AAOx3  DP/PT pulses palpable 2/4, CRT < 3 sec to all digits.  Protective sensation intact. Motor function intact.  LEFT foot: Incision is well coapted without any evidence of dehiscence and sutures are intact.  There is no motion across the incision.  There is mild swelling to the toe.  There is no surrounding erythema, ascending cellulitis.  No drainage or pus or any clinical signs of infection noted today.  The toes is in rectus position.  There is new minimal hyperkeratotic tissue to the fourth toe along the lateral aspect of this is likely more from pressure. No other open lesions or pre-ulcerative lesions.  No pain with calf compression, swelling, warmth, erythema.   Assessment and Plan:  Status post left 5th toe surgery, doing well with no complications   -Treatment options discussed including all alternatives, risks, and complications -Today without any complications.  After removal incision remains well coapted.  Antibiotic ointment and a bandage was applied.  I did show her how to tape the toe to help with swelling.  There is a new hyperkeratotic lesion of the fourth toe that is likely from pressure from the bandage.  The toe was sitting in a rectus position to the fifth toe to help this will limit the pressure of the fourth toe to help with any further issues.  Only to keep  antibiotic ointment on the toe daily but once the scar form she can switch to moisturizer.  I will see her back in 2 weeks and there is some callus, dry skin that I will trim if needed. She states that she is very happy with the surgery. Monitor for any clinical signs or symptoms of infection and directed to call the office immediately should any occur or go to the ER.  Trula Slade DPM

## 2017-12-24 DIAGNOSIS — Z96652 Presence of left artificial knee joint: Secondary | ICD-10-CM | POA: Diagnosis not present

## 2017-12-24 DIAGNOSIS — M65862 Other synovitis and tenosynovitis, left lower leg: Secondary | ICD-10-CM | POA: Diagnosis not present

## 2017-12-24 DIAGNOSIS — M67262 Synovial hypertrophy, not elsewhere classified, left lower leg: Secondary | ICD-10-CM | POA: Diagnosis not present

## 2017-12-24 DIAGNOSIS — M25562 Pain in left knee: Secondary | ICD-10-CM | POA: Diagnosis not present

## 2017-12-24 DIAGNOSIS — M659 Synovitis and tenosynovitis, unspecified: Secondary | ICD-10-CM | POA: Diagnosis not present

## 2018-01-01 ENCOUNTER — Other Ambulatory Visit: Payer: Self-pay | Admitting: Adult Health

## 2018-01-02 NOTE — Telephone Encounter (Signed)
Sent to the pharmacy by e-scribe. 

## 2018-01-07 ENCOUNTER — Ambulatory Visit (INDEPENDENT_AMBULATORY_CARE_PROVIDER_SITE_OTHER): Payer: BLUE CROSS/BLUE SHIELD | Admitting: Podiatry

## 2018-01-07 DIAGNOSIS — M205X9 Other deformities of toe(s) (acquired), unspecified foot: Secondary | ICD-10-CM

## 2018-01-07 DIAGNOSIS — M25562 Pain in left knee: Secondary | ICD-10-CM | POA: Insufficient documentation

## 2018-01-08 NOTE — Progress Notes (Signed)
Subjective: Rose Rose is a 62 y.o. is seen today in office s/p left 5th digit hammertoe repair preformed on 12/04/2017.  She states that she is doing much better has no pain that she was having prior to surgery and the pain that she is expected now is different.  She states that she did have some pain when she was sitting on the ground and she stood back up.  She denies any recent injury or trauma.  She still having swelling to the toe.  She is able to go back into a closed in shoe incised she presented today.  She has no other concerns today. Denies any systemic complaints such as fevers, chills, nausea, vomiting. No calf pain, chest pain, shortness of breath.   Objective: General: No acute distress, AAOx3  DP/PT pulses palpable 2/4, CRT < 3 sec to all digits.  Protective sensation intact. Motor function intact.  LEFT foot: Incision is well coapted without any evidence of dehiscence and a scar has formed.  There is still mild edema on the toe but there is no significant erythema or increase in warmth.  There is no drainage or pus or any clinical signs of infection noted.  No ascending sialitis.  The toes is in rectus position.  There is point that she is having prior to surgery has resolved and there is no other hyperkeratotic lesions identified today. No other open lesions or pre-ulcerative lesions.  No pain with calf compression, swelling, warmth, erythema.   Assessment and Plan:  Status post left 5th toe surgery, doing well with no complications   -Treatment options discussed including all alternatives, risks, and complications -At this point incision is well-healed there is no corneal hyperkeratotic tissue present.  She was get a pedicure this week and discussed that she can do this.  She is back to wearing regular shoe.  I want her to wear stiffer soled shoe and gradually increase the amount of walking that time that she is wearing the shoe.  She is scheduled back to work on Monday I think this  will be feasible for her.  I did discuss that she can buddy splint the toe to help stabilize that should she need it but I do want her to continue to tape the toe to help with swelling.  Continue to ice the area as well.  Trula Slade DPM

## 2018-01-09 DIAGNOSIS — M25562 Pain in left knee: Secondary | ICD-10-CM | POA: Diagnosis not present

## 2018-01-14 DIAGNOSIS — M25562 Pain in left knee: Secondary | ICD-10-CM | POA: Diagnosis not present

## 2018-01-16 DIAGNOSIS — M25562 Pain in left knee: Secondary | ICD-10-CM | POA: Diagnosis not present

## 2018-01-21 DIAGNOSIS — M25562 Pain in left knee: Secondary | ICD-10-CM | POA: Diagnosis not present

## 2018-01-23 DIAGNOSIS — M25562 Pain in left knee: Secondary | ICD-10-CM | POA: Diagnosis not present

## 2018-01-27 DIAGNOSIS — L821 Other seborrheic keratosis: Secondary | ICD-10-CM | POA: Diagnosis not present

## 2018-01-27 DIAGNOSIS — L570 Actinic keratosis: Secondary | ICD-10-CM | POA: Diagnosis not present

## 2018-01-28 DIAGNOSIS — M25562 Pain in left knee: Secondary | ICD-10-CM | POA: Diagnosis not present

## 2018-02-04 ENCOUNTER — Ambulatory Visit (INDEPENDENT_AMBULATORY_CARE_PROVIDER_SITE_OTHER): Payer: BLUE CROSS/BLUE SHIELD | Admitting: Podiatry

## 2018-02-04 ENCOUNTER — Encounter: Payer: Self-pay | Admitting: Podiatry

## 2018-02-04 DIAGNOSIS — Q828 Other specified congenital malformations of skin: Secondary | ICD-10-CM

## 2018-02-04 DIAGNOSIS — M205X9 Other deformities of toe(s) (acquired), unspecified foot: Secondary | ICD-10-CM

## 2018-02-05 NOTE — Progress Notes (Signed)
Subjective: Rose Rose is a 62 y.o. is seen today in office s/p left 5th digit hammertoe repair preformed on 12/04/2017.  She states that she is doing very well she has no pain to the toe.  She still reports a mild swelling to the area but overall she is doing well and she is very happy with the outcome of the surgery. Denies any systemic complaints such as fevers, chills, nausea, vomiting. No calf pain, chest pain, shortness of breath.   Objective: General: No acute distress, AAOx3  DP/PT pulses palpable 2/4, CRT < 3 sec to all digits.  Protective sensation intact. Motor function intact.  LEFT foot: Incision is well coapted without any evidence of dehiscence and a scar has formed.  There is still some mild swelling to the toe but overall is improved.  The incision is well-healed there is no signs of infection.  There is no recurrence of the corn Continues to have a callus on the right foot that she may want to have excised later this year but there is no pain to the area tenderness or any redness or drainage or any signs of infection present. No other open lesions or pre-ulcerative lesions.  No pain with calf compression, swelling, warmth, erythema.   Assessment and Plan:  Status post left 5th toe surgery, doing well with no complications   -Treatment options discussed including all alternatives, risks, and complications -Surgery site is doing well.  Continue with ice if needed at the end of the day to help with swelling.  Also continue with the supportive shoes.  Continue the toe separator just to allow the toe to continue to heal in a rectus position. -She has a thick callus on the right foot.  Discussed excision of this area but she will consider doing this later this year. -At this point discharge her from the postoperative care for the left foot as she is doing well.  Encouraged to call however with any questions or concerns or any change in symptoms.  Trula Slade DPM

## 2018-03-13 ENCOUNTER — Telehealth: Payer: Self-pay | Admitting: Adult Health

## 2018-03-13 DIAGNOSIS — Z760 Encounter for issue of repeat prescription: Secondary | ICD-10-CM

## 2018-03-13 MED ORDER — DIAZEPAM 2 MG PO TABS: 2.0000 mg | ORAL_TABLET | Freq: Two times a day (BID) | ORAL | 0 refills | Status: DC | PRN

## 2018-03-13 NOTE — Telephone Encounter (Signed)
Copied from [place] [id number]. Topic: Quick Communication - Rx Refill/Question >> Mar 13, 2018  9:11 AM Scherrie Gerlach wrote: Medication: diazepam (VALIUM) 2 MG tablet [facility] [place], [place] to [facility] [employer] [facility] [phone] (Phone) [phone] (Fax)

## 2018-04-21 DIAGNOSIS — Z96653 Presence of artificial knee joint, bilateral: Secondary | ICD-10-CM | POA: Diagnosis not present

## 2018-05-08 DIAGNOSIS — Z9889 Other specified postprocedural states: Secondary | ICD-10-CM | POA: Diagnosis not present

## 2018-05-08 DIAGNOSIS — M1712 Unilateral primary osteoarthritis, left knee: Secondary | ICD-10-CM | POA: Diagnosis not present

## 2018-05-19 ENCOUNTER — Ambulatory Visit: Payer: BLUE CROSS/BLUE SHIELD | Admitting: Podiatry

## 2018-06-03 ENCOUNTER — Ambulatory Visit: Payer: BLUE CROSS/BLUE SHIELD | Admitting: Podiatry

## 2018-06-03 ENCOUNTER — Ambulatory Visit (INDEPENDENT_AMBULATORY_CARE_PROVIDER_SITE_OTHER): Payer: BLUE CROSS/BLUE SHIELD

## 2018-06-03 ENCOUNTER — Encounter: Payer: Self-pay | Admitting: Podiatry

## 2018-06-03 DIAGNOSIS — R609 Edema, unspecified: Secondary | ICD-10-CM

## 2018-06-03 DIAGNOSIS — M7752 Other enthesopathy of left foot: Secondary | ICD-10-CM | POA: Diagnosis not present

## 2018-06-03 DIAGNOSIS — M2042 Other hammer toe(s) (acquired), left foot: Secondary | ICD-10-CM | POA: Diagnosis not present

## 2018-06-03 DIAGNOSIS — M779 Enthesopathy, unspecified: Secondary | ICD-10-CM

## 2018-06-03 MED ORDER — MELOXICAM 15 MG PO TABS: 15.0000 mg | ORAL_TABLET | Freq: Every day | ORAL | 0 refills | Status: DC

## 2018-06-04 ENCOUNTER — Telehealth: Payer: Self-pay | Admitting: *Deleted

## 2018-06-04 NOTE — Telephone Encounter (Signed)
Called patient and left a message stating that per Dr Jacqualyn Posey he wanted to get some blood work done to see why you have some swelling in the toe and I stated to call me back or patient could come by and pick the paper work up. Lattie Haw

## 2018-06-04 NOTE — Progress Notes (Signed)
Subjective: 62 year old female presents the office today for concerns of pain to the left fifth toe.  She previously underwent arthroplasty of the fifth toe in February 2019 she was doing well I discharged her but she states that she is continue to get swelling and pain on a consistent basis she has difficulty wearing shoes because of the swelling.  She denies any recent injury or trauma she denies any opening to the toe or any drainage.  She states the scar in the incision looks very good but unfortunately the toe is very painful for her. Denies any systemic complaints such as fevers, chills, nausea, vomiting. No acute changes since last appointment, and no other complaints at this time.   Objective: AAO x3, NAD DP/PT pulses palpable bilaterally, CRT less than 3 seconds Incision for the left fifth toe is well-healed from the prior surgery.  There is no recurrence of the hyperkeratotic lesion.  There is mild to moderate swelling to the toe there is tenderness palpation mostly to the PIPJ.  There is some faint erythema but this is more from inflammation.  There is no increase in warmth of the toe and there is no fluctuation or crepitation or open sores.  There is no ascending sialitis.  Does not appear to be infectious. No open lesions or pre-ulcerative lesions.  No pain with calf compression, swelling, warmth, erythema  Assessment: Swelling, capsulitis left fifth  Plan: -All treatment options discussed with the patient including all alternatives, risks, complications.  -X-rays were obtained and reviewed.  No evidence of acute fracture.  Swelling is present.  Status post arthroplasty of the fifth digit. -At this time we discussed a steroid injection.  She wishes to proceed.  The skin was prepped with alcohol and then a mixture of 1/4 cc dexamethasone phosphate and 1/4 cc of Marcaine plain was infiltrated into the fifth toe on the PIPJ with any complications.  Postinjection care was discussed.  I want  her to ice it every day as well as elevated towards the end of the day and we also want to go back to taking the toe to help with swelling. -Patient encouraged to call the office with any questions, concerns, change in symptoms.    After she left the decided to get blood work including ESR, CRP, CBC.  Cranford Mon, CMA contacted her to get this time.  I doubt there is an infection but I did make sure.  Trula Slade DPM

## 2018-07-08 ENCOUNTER — Encounter: Payer: Self-pay | Admitting: Podiatry

## 2018-07-08 ENCOUNTER — Ambulatory Visit: Payer: BLUE CROSS/BLUE SHIELD | Admitting: Podiatry

## 2018-07-08 ENCOUNTER — Ambulatory Visit (INDEPENDENT_AMBULATORY_CARE_PROVIDER_SITE_OTHER): Payer: BLUE CROSS/BLUE SHIELD

## 2018-07-08 DIAGNOSIS — R609 Edema, unspecified: Secondary | ICD-10-CM

## 2018-07-08 DIAGNOSIS — M2042 Other hammer toe(s) (acquired), left foot: Secondary | ICD-10-CM

## 2018-07-08 MED ORDER — DOXYCYCLINE HYCLATE 100 MG PO TABS
100.0000 mg | ORAL_TABLET | Freq: Two times a day (BID) | ORAL | 0 refills | Status: DC
Start: 2018-07-08 — End: 2018-07-17

## 2018-07-09 LAB — CBC WITH DIFFERENTIAL/PLATELET
Basophils Absolute: 50 cells/uL (ref 0–200)
Basophils Relative: 0.5 %
EOS PCT: 1.2 %
Eosinophils Absolute: 119 cells/uL (ref 15–500)
HCT: 41.4 % (ref 35.0–45.0)
Hemoglobin: 14.2 g/dL (ref 11.7–15.5)
Lymphs Abs: 3029 cells/uL (ref 850–3900)
MCH: 29.3 pg (ref 27.0–33.0)
MCHC: 34.3 g/dL (ref 32.0–36.0)
MCV: 85.5 fL (ref 80.0–100.0)
MONOS PCT: 6.2 %
MPV: 10.2 fL (ref 7.5–12.5)
NEUTROS PCT: 61.5 %
Neutro Abs: 6089 cells/uL (ref 1500–7800)
PLATELETS: 271 10*3/uL (ref 140–400)
RBC: 4.84 10*6/uL (ref 3.80–5.10)
RDW: 13.2 % (ref 11.0–15.0)
TOTAL LYMPHOCYTE: 30.6 %
WBC: 9.9 10*3/uL (ref 3.8–10.8)
WBCMIX: 614 {cells}/uL (ref 200–950)

## 2018-07-09 LAB — BASIC METABOLIC PANEL
BUN: 23 mg/dL (ref 7–25)
CO2: 26 mmol/L (ref 20–32)
Calcium: 9.8 mg/dL (ref 8.6–10.4)
Chloride: 103 mmol/L (ref 98–110)
Creat: 0.67 mg/dL (ref 0.50–0.99)
Glucose, Bld: 80 mg/dL (ref 65–139)
POTASSIUM: 3.5 mmol/L (ref 3.5–5.3)
Sodium: 140 mmol/L (ref 135–146)

## 2018-07-09 LAB — C-REACTIVE PROTEIN: CRP: 10 mg/L — AB (ref <8.0)

## 2018-07-09 LAB — URIC ACID: URIC ACID, SERUM: 5.6 mg/dL (ref 2.5–7.0)

## 2018-07-09 LAB — SEDIMENTATION RATE: SED RATE: 9 mm/h (ref 0–30)

## 2018-07-09 NOTE — Progress Notes (Signed)
Subjective: 62 year old female presents the office today for follow-up evaluation of left fifth toe pain.  She states the injection last appointment did help but she still having swelling and pain to the area.  She states that there is one area that it does hurt more so than the others she points on the dorsal lateral aspect along the joint.  She states that she has pain with shoes mostly.  There is only a few pairs of shoes that she can wear without any pain.  She denies any significant increase in redness or warmth.  Any open sores to the toe. Denies any systemic complaints such as fevers, chills, nausea, vomiting. No acute changes since last appointment, and no other complaints at this time.   Objective: AAO x3, NAD DP/PT pulses palpable bilaterally, CRT less than 3 seconds There is edema to the left fifth toe still but overall appears to be somewhat improved compared to last appointment.  There is one area of edema and tenderness more to the dorsal lateral aspect along the PIPJ area.  Especially with majority tenderness in the most of the shoes.  There is minimal pain to palpation on direct palpation to the toe and is mostly with wearing shoes and pressure.  There is no significant erythema there is no increase in warmth to the toe and there is no ascending sialitis.  There is no areas of fluctuation or crepitation or any malodor. No open lesions or pre-ulcerative lesions.  No pain with calf compression, swelling, warmth, erythema  Assessment: Left fifth toe swelling, pain  Plan: -All treatment options discussed with the patient including all alternatives, risks, complications.  -Repeat x-rays were obtained and reviewed with her today.  There is radiolucency present in the proximal phalanx distally which has progressed since last appointment.  Given her pain and swelling I am concerned for infection to the area.  We could cause your blood work after last appointment but she did not get this done.   I will recheck her blood work today.  In case of infection we are going to go ahead and start doxycycline.  I want her to go back to the surgical shoe.  Continue to wrap the toe to help with the swelling.  Ultimately discussed further surgery including removal of more of the proximal phalanx and biopsy.  I will see her back in the next 7 to 10 days for repeat x-rays and see how she is doing.  If there is any signs or symptoms of infection we discussed today to call the office to the emergency room.  She has no pain upon palpation exam is mostly with wearing shoes given the swelling.  Her clinical symptoms do not appear to be infectious. -Patient encouraged to call the office with any questions, concerns, change in symptoms.   Trula Slade DPM

## 2018-07-14 ENCOUNTER — Telehealth: Payer: Self-pay | Admitting: *Deleted

## 2018-07-14 NOTE — Telephone Encounter (Signed)
I spoke with pt's husband, Fritz Pickerel and he said call pt on her mobile she is probably at [employer].

## 2018-07-14 NOTE — Telephone Encounter (Signed)
Left message requesting call back to discuss lab result.

## 2018-07-14 NOTE — Telephone Encounter (Signed)
-----  Message from Trula Slade, DPM sent at 07/10/2018 12:15 PM EDT ----- WBC and ESR is normal. CRP is minimally elevated. Continue antibiotics and get new x-rays next appointment. Wear surgical shoe.

## 2018-07-17 ENCOUNTER — Ambulatory Visit: Payer: BLUE CROSS/BLUE SHIELD | Admitting: Podiatry

## 2018-07-17 ENCOUNTER — Ambulatory Visit (INDEPENDENT_AMBULATORY_CARE_PROVIDER_SITE_OTHER): Payer: BLUE CROSS/BLUE SHIELD

## 2018-07-17 DIAGNOSIS — M7989 Other specified soft tissue disorders: Secondary | ICD-10-CM | POA: Diagnosis not present

## 2018-07-17 DIAGNOSIS — M2042 Other hammer toe(s) (acquired), left foot: Secondary | ICD-10-CM | POA: Diagnosis not present

## 2018-07-17 DIAGNOSIS — M2031 Hallux varus (acquired), right foot: Secondary | ICD-10-CM

## 2018-07-17 DIAGNOSIS — M2032 Hallux varus (acquired), left foot: Secondary | ICD-10-CM

## 2018-07-17 DIAGNOSIS — R52 Pain, unspecified: Secondary | ICD-10-CM

## 2018-07-17 MED ORDER — DOXYCYCLINE HYCLATE 100 MG PO TABS: 100.0000 mg | ORAL_TABLET | Freq: Two times a day (BID) | ORAL | 0 refills | Status: DC

## 2018-07-17 NOTE — Progress Notes (Signed)
Subjective: 62 year old female presents the office for follow-up evaluation of left fifth toe swelling, pain and possible infection.  She is on the Augmentin she is been wearing a surgical shoe.  She states that she still having pain to the toe although swelling has improved.  She has some random shooting pain to the toe at times.  She states that she does try to feel it every day and she has not noticed any redness or warmth from the toe. Denies any systemic complaints such as fevers, chills, nausea, vomiting. No acute changes since last appointment, and no other complaints at this time.   Objective: AAO x3, NAD DP/PT pulses palpable bilaterally, CRT less than 3 seconds There is decrease edema to the left fifth toe however still some mild subjective tenderness father was present today there is no significant tenderness palpation.  There is no erythema or increase in warmth.  The toes and rectus position there is no open sores identified.  No pain to the proximal aspect on the fifth MPJ, metatarsal other areas of the foot. No open lesions or pre-ulcerative lesions.  No pain with calf compression, swelling, warmth, erythema  Assessment: Decreased swelling but continue: Referral  Plan: -All treatment options discussed with the patient including all alternatives, risks, complications.  -Repeat x-rays were obtained today.  Radiolucency and debridement of the proximal phalanx however is unchanged compared to previous x-ray.  No soft tissue emphysema. -At this point she is improving.  We will continue with doxycycline going to treat this as it is a osteo-she is responded the doxycycline so would continue with this.  Repeat blood work Office manager.  Also discussed further surgical intervention resect more the proximal phalanx.  We are going to continue with surgical shoe antibiotics and see her back in 2 weeks in the point to do x-ray to further discuss this.  However monitors closely for any signs or symptoms of  infection call the office immediately should any occur. -Patient encouraged to call the office with any questions, concerns, change in symptoms.   Trula Slade DPM

## 2018-07-18 ENCOUNTER — Other Ambulatory Visit: Payer: Self-pay | Admitting: Podiatry

## 2018-07-18 DIAGNOSIS — M2031 Hallux varus (acquired), right foot: Secondary | ICD-10-CM

## 2018-07-22 DIAGNOSIS — Z85828 Personal history of other malignant neoplasm of skin: Secondary | ICD-10-CM | POA: Diagnosis not present

## 2018-07-22 DIAGNOSIS — L578 Other skin changes due to chronic exposure to nonionizing radiation: Secondary | ICD-10-CM | POA: Diagnosis not present

## 2018-07-22 DIAGNOSIS — D485 Neoplasm of uncertain behavior of skin: Secondary | ICD-10-CM | POA: Diagnosis not present

## 2018-07-22 DIAGNOSIS — L989 Disorder of the skin and subcutaneous tissue, unspecified: Secondary | ICD-10-CM | POA: Diagnosis not present

## 2018-08-07 ENCOUNTER — Encounter: Payer: Self-pay | Admitting: Podiatry

## 2018-08-07 ENCOUNTER — Ambulatory Visit (INDEPENDENT_AMBULATORY_CARE_PROVIDER_SITE_OTHER): Payer: BLUE CROSS/BLUE SHIELD

## 2018-08-07 ENCOUNTER — Ambulatory Visit: Payer: BLUE CROSS/BLUE SHIELD | Admitting: Podiatry

## 2018-08-07 DIAGNOSIS — M2042 Other hammer toe(s) (acquired), left foot: Secondary | ICD-10-CM | POA: Diagnosis not present

## 2018-08-07 DIAGNOSIS — M79672 Pain in left foot: Secondary | ICD-10-CM | POA: Diagnosis not present

## 2018-08-07 DIAGNOSIS — M898X9 Other specified disorders of bone, unspecified site: Secondary | ICD-10-CM | POA: Diagnosis not present

## 2018-08-07 NOTE — Patient Instructions (Signed)
Pre-Operative Instructions  Congratulations, you have decided to take an important step towards improving your quality of life.  You can be assured that the doctors and staff at [facility] will be with you every step of the way.  Here are some important things you should know:  1. Plan to be at the surgery center/hospital at least 1 (one) hour prior to your scheduled time, unless otherwise directed by the surgical center/hospital staff.  You must have a responsible adult accompany you, remain during the surgery and drive you home.  Make sure you have directions to the surgical center/hospital to ensure you arrive on time. 2. If you are having surgery at [facility] or [facility], you will need a copy of your medical history and physical form from your family physician within one month prior to the date of surgery. We will give you a form for your primary physician to complete.  3. We make every effort to accommodate the date you request for surgery.  However, there are times where surgery dates or times have to be moved.  We will contact you as soon as possible if a change in schedule is required.   4. No aspirin/ibuprofen for one week before surgery.  If you are on aspirin, any non-steroidal anti-inflammatory medications (Mobic, Aleve, Ibuprofen) should not be taken seven (7) days prior to your surgery.  You make take Tylenol for pain prior to surgery.  5. Medications - If you are taking daily heart and blood pressure medications, seizure, reflux, allergy, asthma, anxiety, pain or diabetes medications, make sure you notify the surgery center/hospital before the day of surgery so they can tell you which medications you should take or avoid the day of surgery. 6. No food or drink after midnight the night before surgery unless directed otherwise by surgical center/hospital staff. 7. No alcoholic beverages 24-hours prior to surgery.  No smoking 24-hours prior or 24-hours after  surgery. 8. Wear loose pants or shorts. They should be loose enough to fit over bandages, boots, and casts. 9. Don't wear slip-on shoes. Sneakers are preferred. 10. Bring your boot with you to the surgery center/hospital.  Also bring crutches or a walker if your physician has prescribed it for you.  If you do not have this equipment, it will be provided for you after surgery. 11. If you have not been contacted by the surgery center/hospital by the day before your surgery, call to confirm the date and time of your surgery. 12. Leave-time from work may vary depending on the type of surgery you have.  Appropriate arrangements should be made prior to surgery with your employer. 13. Prescriptions will be provided immediately following surgery by your doctor.  Fill these as soon as possible after surgery and take the medication as directed. Pain medications will not be refilled on weekends and must be approved by the doctor. 14. Remove nail polish on the operative foot and avoid getting pedicures prior to surgery. 15. Wash the night before surgery.  The night before surgery wash the foot and leg well with water and the antibacterial soap provided. Be sure to pay special attention to beneath the toenails and in between the toes.  Wash for at least three (3) minutes. Rinse thoroughly with water and dry well with a towel.  Perform this wash unless told not to do so by your physician.  Enclosed: 1 Ice pack (please put in freezer the night before surgery)   1 Hibiclens skin cleaner     Pre-op instructions  If you have any questions regarding the instructions, please do not hesitate to call our office.  [place]: [address], , [place] [address] -- [phone]  [place]: [address] [place], [place] [address] -- [phone]  [place]: [address]  [place], [place] [address] -- [phone]  [place]: [address], [address], [place],  [address] -- [phone]  Website:  [email or web address] 

## 2018-08-10 NOTE — Progress Notes (Signed)
Subjective: 62 year old female presents the office for follow-up evaluation of left fifth toe swelling, pain and possible infection. She is remained in the surgical shoe and she states that her symptoms are improving however she did try to get back into regular shoe as she could not.  There is one area of tenderness and she points to the dorsal lateral aspect of the toe and the IPJ where she is majority of tenderness and try to wear shoes.  Overall the swelling has improved.  She is told antibiotics. Denies any systemic complaints such as fevers, chills, nausea, vomiting. No acute changes since last appointment, and no other complaints at this time.   Objective: AAO x3, NAD DP/PT pulses palpable bilaterally, CRT less than 3 seconds There is mild edema to the left fifth toe however still some discomfort and this is mostly on the dorsal lateral aspect of the toe and is one specific area of tenderness and this correlates to the area of a small ossicle, exostosis off the lateral proximal phalanx.  There is no other areas of tenderness in the toe there is no erythema or warmth there is no ascending sialitis.  No open sores.  There is no fluctuation or crepitation.No open lesions or pre-ulcerative lesions.  No pain with calf compression, swelling, warmth, erythema  Assessment: Decreased swelling but continued pain to fifth toe  Plan: -All treatment options discussed with the patient including all alternatives, risks, complications.  -At this point her swelling is improved.  I do not think she has infection to monitor since her course of antibiotics.  I discussed with her resection of further bone from the proximal phalanx one to help with her pain but also for biopsy of the bone.  She will do this as soon as possible.  We discussed the surgery as well as postoperative course.  If there is infection she understands that she is at risk of amputation which she would be okay with she states but hopefully we can  avoid this. -We will plan for resection of bone from the 5th proximal phalanx.  -The incision placement as well as the postoperative course was discussed with the patient. I discussed risks of the surgery which include, but not limited to, infection, bleeding, pain, swelling, need for further surgery, delayed or nonhealing, painful or ugly scar, numbness or sensation changes, over/under correction, recurrence, transfer lesions, further deformity, hardware failure, DVT/PE, loss of toe/foot. Patient understands these risks and wishes to proceed with surgery. The surgical consent was reviewed with the patient all 3 pages were signed. No promises or guarantees were given to the outcome of the procedure. All questions were answered to the best of my ability. Before the surgery the patient was encouraged to call the office if there is any further questions. The surgery will be performed at the [facility] on an outpatient basis.  Trula Slade DPM

## 2018-08-13 ENCOUNTER — Other Ambulatory Visit: Payer: Self-pay | Admitting: Podiatry

## 2018-08-13 ENCOUNTER — Encounter: Payer: Self-pay | Admitting: Podiatry

## 2018-08-13 DIAGNOSIS — E7800 Pure hypercholesterolemia, unspecified: Secondary | ICD-10-CM | POA: Diagnosis not present

## 2018-08-13 DIAGNOSIS — M2042 Other hammer toe(s) (acquired), left foot: Secondary | ICD-10-CM | POA: Diagnosis not present

## 2018-08-13 DIAGNOSIS — M89372 Hypertrophy of bone, left ankle and foot: Secondary | ICD-10-CM | POA: Diagnosis not present

## 2018-08-13 MED ORDER — HYDROMORPHONE HCL 2 MG PO TABS: 2.0000 mg | ORAL_TABLET | ORAL | 0 refills | Status: DC | PRN

## 2018-08-13 MED ORDER — CLINDAMYCIN HCL 300 MG PO CAPS: 300.0000 mg | ORAL_CAPSULE | Freq: Three times a day (TID) | ORAL | 2 refills | Status: DC

## 2018-08-13 NOTE — Progress Notes (Signed)
Called and spoke to pharmacy. Cancelled order that I dispensed #50 tablets and resent it as #20 tablets.

## 2018-08-13 NOTE — Progress Notes (Signed)
Postop medications sent 

## 2018-08-15 ENCOUNTER — Telehealth: Payer: Self-pay | Admitting: Podiatry

## 2018-08-15 ENCOUNTER — Telehealth: Payer: Self-pay | Admitting: *Deleted

## 2018-08-15 MED ORDER — DOXYCYCLINE HYCLATE 100 MG PO CAPS: 100.0000 mg | ORAL_CAPSULE | Freq: Two times a day (BID) | ORAL | 0 refills | Status: DC

## 2018-08-15 NOTE — Telephone Encounter (Signed)
I informed pt of Dr. Leigh Aurora orders for doxycycline.

## 2018-08-15 NOTE — Telephone Encounter (Signed)
Called and left a message for the patient to call me back and I had called to see how patient was doing after surgery and to call the office at [phone]. Lattie Haw

## 2018-08-15 NOTE — Telephone Encounter (Signed)
I had surgery with Dr. Jacqualyn Posey on Wednesday. The antibiotic he prescribed is making me feel like I cannot breath, its like acid reflux burn and its killing me. If you can call me back at [phone]. Thank you.

## 2018-08-15 NOTE — Telephone Encounter (Addendum)
I called pt, she states the pill is actually causing a barrier in her throat, making it difficult to swallow, and I was stuck once and made her vomit. Pt states she is not having shortness of breath, difficulty breathing, swelling, rash or itching, the pill physically will not go down, and she is going to take the pills she has left over from before the surgery.

## 2018-08-15 NOTE — Telephone Encounter (Signed)
Yes, please do doxycycline 100mg  BID x 7 days

## 2018-08-15 NOTE — Addendum Note (Signed)
Addended by: Harriett Sine D on: 08/15/2018 03:57 PM   Modules accepted: Orders

## 2018-08-20 ENCOUNTER — Encounter: Payer: Self-pay | Admitting: Podiatry

## 2018-08-21 ENCOUNTER — Ambulatory Visit (INDEPENDENT_AMBULATORY_CARE_PROVIDER_SITE_OTHER): Payer: BLUE CROSS/BLUE SHIELD | Admitting: Podiatry

## 2018-08-21 ENCOUNTER — Ambulatory Visit (INDEPENDENT_AMBULATORY_CARE_PROVIDER_SITE_OTHER): Payer: BLUE CROSS/BLUE SHIELD

## 2018-08-21 DIAGNOSIS — M2042 Other hammer toe(s) (acquired), left foot: Secondary | ICD-10-CM

## 2018-08-21 DIAGNOSIS — M79676 Pain in unspecified toe(s): Secondary | ICD-10-CM

## 2018-08-25 NOTE — Progress Notes (Signed)
Subjective: Rose Rose is a 62 y.o. is seen today in office s/p left foot excision of bone from fifth digit proximal phalanx preformed on 08/13/2018. They state their pain is controlled.  She is been in the surgical shoe. Denies any systemic complaints such as fevers, chills, nausea, vomiting. No calf pain, chest pain, shortness of breath.   Objective: General: No acute distress, AAOx3  DP/PT pulses palpable 2/4, CRT < 3 sec to all digits.  Protective sensation intact. Motor function intact.  LEFT foot: Incision is well coapted without any evidence of dehiscence and sutures are intact.. There is no surrounding erythema, ascending cellulitis, fluctuance, crepitus, malodor, drainage/purulence. There is decreased edema around the surgical site compared to what it was prior surgery. There is minimal pain along the surgical site.  Toes and rectus position No other areas of tenderness to bilateral lower extremities.  No other open lesions or pre-ulcerative lesions.  No pain with calf compression, swelling, warmth, erythema.   Assessment and Plan:  Status post left foot surgery, doing well with no complications   -Treatment options discussed including all alternatives, risks, and complications -X-rays were obtained reviewed.  Bone resection margins are sharp with any evidence of osteomyelitis or infection. -Antibiotic ointment was applied followed by dressing.  Keep the dressing clean, dry, intact. -Continue surgical shoe -Ice/elevation -Pain medication as needed. -Monitor for any clinical signs or symptoms of infection and DVT/PE and directed to call the office immediately should any occur or go to the ER. -Follow-up in 1 week or sooner if any problems arise. In the meantime, encouraged to call the office with any questions, concerns, change in symptoms.   Celesta Gentile, DPM

## 2018-08-27 ENCOUNTER — Other Ambulatory Visit: Payer: Self-pay | Admitting: Adult Health

## 2018-08-27 DIAGNOSIS — Z760 Encounter for issue of repeat prescription: Secondary | ICD-10-CM

## 2018-08-27 NOTE — Telephone Encounter (Signed)
Copied from [place] [id number]. Topic: Quick Communication - Rx Refill/Question >> Aug 27, 2018  1:51 PM Keene Breath wrote: Medication:   diazepam (VALIUM) 2 MG tablet  Patient called to request a refill for the above medication.  CB# [phone]  Preferred Pharmacy (with phone number or street name): [facility], [place] [facility] [employer] [facility] [phone] (Phone) [phone] (Fax)

## 2018-08-27 NOTE — Telephone Encounter (Signed)
Requested medication (s) are due for refill today: Yes  Requested medication (s) are on the active medication list: Yes  Last refill:  03/13/18  Future visit scheduled: No  Notes to clinic:  See request.    Requested Prescriptions  Pending Prescriptions Disp Refills   diazepam (VALIUM) 2 MG tablet 180 tablet 0    Sig: Take 1 tablet (2 mg total) by mouth every 12 (twelve) hours as needed for anxiety.     Not Delegated - Psychiatry:  Anxiolytics/Hypnotics Failed - 08/27/2018  1:56 PM      Failed - This refill cannot be delegated      Failed - Urine Drug Screen completed in last 360 days.      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

## 2018-08-29 MED ORDER — DIAZEPAM 2 MG PO TABS: 2.0000 mg | ORAL_TABLET | Freq: Two times a day (BID) | ORAL | 0 refills | Status: DC | PRN

## 2018-09-02 ENCOUNTER — Ambulatory Visit (INDEPENDENT_AMBULATORY_CARE_PROVIDER_SITE_OTHER): Payer: BLUE CROSS/BLUE SHIELD | Admitting: Podiatry

## 2018-09-02 DIAGNOSIS — M898X9 Other specified disorders of bone, unspecified site: Secondary | ICD-10-CM

## 2018-09-02 DIAGNOSIS — Z9889 Other specified postprocedural states: Secondary | ICD-10-CM

## 2018-09-03 NOTE — Progress Notes (Signed)
Subjective: Rose Rose is a 62 y.o. is seen today in office s/p left foot excision of bone from fifth digit proximal phalanx preformed on 08/13/2018. She states that she is doing well and she is ready to get the sutures removed.  She is been in surgical shoe.  She states that this time she has been following direction.  She states that after her first surgery when she try to get her knee done encouraged walking and she thinks she may have messed up the surgery at that point.  Denies any systemic complaints such as fevers, chills, nausea, vomiting. No calf pain, chest pain, shortness of breath.   Objective: General: No acute distress, AAOx3  DP/PT pulses palpable 2/4, CRT < 3 sec to all digits.  Protective sensation intact. Motor function intact.  LEFT foot: Incision is well coapted without any evidence of dehiscence and sutures are intact.  The toes and rectus position.  Mild swelling to the toe but there is no erythema warmth.  She is very pleased with the position of the toe.  There is no significant discomfort to palpation.  No other areas of tenderness to bilateral lower extremities.  No other open lesions or pre-ulcerative lesions.  No pain with calf compression, swelling, warmth, erythema.   Assessment and Plan:  Status post left foot surgery, doing well with no complications   -Treatment options discussed including all alternatives, risks, and complications -Sutures removed without complications.  After removal incision remained well coapted.  Pressure was applied for reinforcement.  Antibiotic ointment and a bandage was applied.  Instructed to change the bandage tomorrow and start to shower tomorrow.  May need surgical shoe.  Gradually return to regular shoe starting next week.  We will plan to return to work on December 5.  Celesta Gentile, DPM

## 2018-09-04 ENCOUNTER — Encounter: Payer: Self-pay | Admitting: Podiatry

## 2018-09-13 ENCOUNTER — Other Ambulatory Visit: Payer: Self-pay | Admitting: Adult Health

## 2018-09-16 NOTE — Telephone Encounter (Signed)
Pt is coming in to discuss weaning this medication.  No refills needed at this time per pt.  Request denied.

## 2018-09-17 ENCOUNTER — Other Ambulatory Visit: Payer: Self-pay | Admitting: Adult Health

## 2018-09-23 ENCOUNTER — Encounter: Payer: Self-pay | Admitting: Adult Health

## 2018-09-23 ENCOUNTER — Ambulatory Visit: Payer: BLUE CROSS/BLUE SHIELD | Admitting: Adult Health

## 2018-09-23 VITALS — BP 120/78 | Temp 98.7°F | Wt 173.0 lb

## 2018-09-23 DIAGNOSIS — F419 Anxiety disorder, unspecified: Secondary | ICD-10-CM

## 2018-09-23 DIAGNOSIS — Z760 Encounter for issue of repeat prescription: Secondary | ICD-10-CM | POA: Diagnosis not present

## 2018-09-23 MED ORDER — TIZANIDINE HCL 4 MG PO TABS: 4.0000 mg | ORAL_TABLET | Freq: Three times a day (TID) | ORAL | 1 refills | Status: AC | PRN

## 2018-09-23 NOTE — Progress Notes (Signed)
Subjective:    Patient ID: Rose Rose, female    DOB: Feb 23, 1956, 62 y.o.   MRN: [id number]  HPI 62 year old female who  has a past medical history of Arthritis, Complication of anesthesia, Depression, Hyperlipidemia, Hypertension, Insomnia, Pneumonia, and PONV (postoperative nausea and vomiting).  She presents to the office today for follow up. She would like to taper off Celexa. She is not currently taking daily, she takes her morning dose but often forgets to take her evening dose. She does not have any withdraw symptoms   She also needs a refill of Zanaflex  Review of Systems See HPI   Past Medical History:  Diagnosis Date  . Arthritis   . Complication of anesthesia   . Depression   . Hyperlipidemia   . Hypertension   . Insomnia   . Pneumonia   . PONV (postoperative nausea and vomiting)     Social History   Socioeconomic History  . Marital status: Married    Spouse name: Not on file  . Number of children: Not on file  . Years of education: Not on file  . Highest education level: Not on file  Occupational History  . Not on file  Social Needs  . Financial resource strain: Not on file  . Food insecurity:    Worry: Not on file    Inability: Not on file  . Transportation needs:    Medical: Not on file    Non-medical: Not on file  Tobacco Use  . Smoking status: Current Some Day Smoker    Last attempt to quit: 10/22/2002    Years since quitting: 15.9  . Smokeless tobacco: Never Used  Substance and Sexual Activity  . Alcohol use: No  . Drug use: Yes    Types: Marijuana  . Sexual activity: Not on file  Lifestyle  . Physical activity:    Days per week: Not on file    Minutes per session: Not on file  . Stress: Not on file  Relationships  . Social connections:    Talks on phone: Not on file    Gets together: Not on file    Attends religious service: Not on file    Active member of club or organization: Not on file    Attends meetings of clubs or  organizations: Not on file    Relationship status: Not on file  . Intimate partner violence:    Fear of current or ex partner: Not on file    Emotionally abused: Not on file    Physically abused: Not on file    Forced sexual activity: Not on file  Other Topics Concern  . Not on file  Social History Narrative   Works at [employer] - works with [employer].    Married for 21 years   No children     Past Surgical History:  Procedure Laterality Date  . MYRINGOTOMY     Right ear  . TONSILLECTOMY    . 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 Maureen Ralphs  . TOTAL KNEE ARTHROPLASTY Left 01/16/2016   Procedure: TOTAL LEFT KNEE ARTHROPLASTY;  Surgeon: Gaynelle Arabian, MD;  Location: [facility];  Service: Orthopedics;  Laterality: Left;    Family History  Problem Relation Age of Onset  . Arthritis Mother   . Vision loss Mother   . Dementia Father   . Parkinsonism Father   . Alzheimer's disease Maternal Aunt     Allergies  Allergen Reactions  .  Amoxicillin-Pot Clavulanate     REACTION: swelling  Has patient had a PCN reaction causing immediate rash, facial/tongue/throat swelling, SOB or lightheadedness with hypotension: unk Has patient had a PCN reaction causing severe rash involving mucus membranes or skin necrosis: no Has patient had a PCN reaction that required hospitalization: unk Has patient had a PCN reaction occurring within the last 10 years: yes If all of the above answers are "NO", then may proceed with Cephalosporin use.   . Augmentin [Amoxicillin-Pot Clavulanate] Itching and Swelling  . Other   . Oxycodone Hcl Nausea And Vomiting  . Vicodin [Hydrocodone-Acetaminophen] Anxiety    itching    Current Outpatient Medications on File Prior to Visit  Medication Sig Dispense Refill  . benzonatate (TESSALON) 200 MG capsule benzonatate 200 mg capsule    . cetirizine (ZYRTEC) 10 MG tablet Take 1 tablet (10 mg total) by mouth daily. 30 tablet 0  . citalopram (CELEXA) 20  MG tablet TAKE 1 TABLET TWICE A DAY 180 tablet 1  . diazepam (VALIUM) 2 MG tablet Take 1 tablet (2 mg total) by mouth every 12 (twelve) hours as needed for anxiety. 180 tablet 0  . fluticasone (FLONASE) 50 MCG/ACT nasal spray Place 2 sprays into both nostrils daily as needed for allergies or rhinitis.     . hydrochlorothiazide (HYDRODIURIL) 25 MG tablet TAKE 1/2 TO 1 TABLET       (12.5MG  - 25MG ) DAILY 90 tablet 3  . meclizine (ANTIVERT) 25 MG tablet Take 1 tablet (25 mg total) by mouth 3 (three) times daily as needed for dizziness. 30 tablet 1  . olopatadine (PATANOL) 0.1 % ophthalmic solution Place 1 drop into both eyes daily as needed for allergies.     . simvastatin (ZOCOR) 20 MG tablet TAKE 1 TABLET DAILY (Patient taking differently: TAKE one half TABLET (10 mg) daily at bedtime) 90 tablet 0  . simvastatin (ZOCOR) 20 MG tablet TAKE 1 TABLET DAILY 90 tablet 2  . tiZANidine (ZANAFLEX) 4 MG tablet TAKE 1 TABLET EVERY 8 HOURSAS NEEDED 270 tablet 1  . valACYclovir (VALTREX) 500 MG tablet TAKE 1 TABLET DAILY AS  NEEDED for fever blisters 90 tablet 0   No current facility-administered medications on file prior to visit.     BP 120/78   Temp 98.7 F (37.1 C)   Wt 173 lb (78.5 kg)   BMI 31.64 kg/m       Objective:   Physical Exam  Constitutional: She is oriented to person, place, and time. She appears well-developed and well-nourished. No distress.  Cardiovascular: Normal rate, regular rhythm, normal heart sounds and intact distal pulses.  Pulmonary/Chest: Effort normal and breath sounds normal.  Neurological: She is alert and oriented to person, place, and time.  Skin: Skin is warm and dry. Capillary refill takes less than 2 seconds. She is not diaphoretic.  Psychiatric: She has a normal mood and affect. Her behavior is normal. Judgment and thought content normal.  Nursing note and vitals reviewed.     Assessment & Plan:  1. Anxiety - Discussed taper instructions with the patient.  -  Follow up with withdraw symptoms   2. Medication refill - tiZANidine (ZANAFLEX) 4 MG tablet; Take 1 tablet (4 mg total) by mouth every 8 (eight) hours as needed for up to 90 doses for muscle spasms.  Dispense: 90 tablet; Refill: 1   Dorothyann Peng, NP

## 2018-09-30 ENCOUNTER — Ambulatory Visit: Payer: BLUE CROSS/BLUE SHIELD | Admitting: Podiatry

## 2018-09-30 ENCOUNTER — Ambulatory Visit (INDEPENDENT_AMBULATORY_CARE_PROVIDER_SITE_OTHER): Payer: BLUE CROSS/BLUE SHIELD

## 2018-09-30 DIAGNOSIS — M2042 Other hammer toe(s) (acquired), left foot: Secondary | ICD-10-CM

## 2018-09-30 DIAGNOSIS — Z9889 Other specified postprocedural states: Secondary | ICD-10-CM

## 2018-10-01 NOTE — Progress Notes (Signed)
Subjective: Rose Rose is a 62 y.o. is seen today in office s/p left foot excision of bone from fifth digit proximal phalanx preformed on 08/13/2018.  She states that she is doing great.  She states the swelling continues to improve and she is having no discomfort at this time.  She gets some occasional discomfort towards the end of the day after wearing shoes now that she is been back at work but overall there has been minimal and much improved compared to what it was prior to surgery.  Swelling is actually improved as well since having surgery. Denies any systemic complaints such as fevers, chills, nausea, vomiting. No calf pain, chest pain, shortness of breath.   Objective: General: No acute distress, AAOx3  DP/PT pulses palpable 2/4, CRT < 3 sec to all digits.  Protective sensation intact. Motor function intact.  LEFT foot: Incision is well coapted without any evidence of dehiscence and a scar is formed.  There is mild edema to the toe but overall is clinically improved compared to what it was prior to surgery.  There is no cellulitis there is no ascending cellulitis.  There is no fluctuation.  No tenderness palpation of the surgical site.  The toes and rectus position she states that she has very pleased with the outcome of the surgery she is feeling much better.  No other areas of tenderness to bilateral lower extremities.  No other open lesions or pre-ulcerative lesions.  No pain with calf compression, swelling, warmth, erythema.   Assessment and Plan:  Status post left foot surgery, doing well with no complications   -Treatment options discussed including all alternatives, risks, and complications -X-rays were obtained reviewed.  Status post arthroplasty of the left fifth toe.  Mild heterotopic ossification is present. -Overall she is doing much better.  Did discuss with her to tape the toe to help with swelling and to prevent issues.  Continue with wearing a shoe that is now putting  pressure on the toe to help prevent recurrence.  Ice elevation.  Is currently discharged with a postoperative care and she agrees with this plan she has no further questions or concerns today.  I will see her back as needed.  I encouraged to call any questions concerns or any changes.  Trula Slade DPM

## 2018-10-21 IMAGING — NM NM BONE 3 PHASE
7 series · 17 of 17 positions shown · non-contrast
Comparison: None

Radiographic correlation:  None since surgery

CLINICAL DATA: LEFT knee pain, history of prior BILATERAL knee
replacement surgery for arthritis, on LEFT in 5208, on RIGHT in 1151

EXAM:
NUCLEAR MEDICINE 3-PHASE BONE SCAN
TECHNIQUE: Radionuclide angiographic images, immediate static blood pool
images, and 3-hour delayed static images were obtained of the knees
after intravenous injection of radiopharmaceutical.
RADIOPHARMACEUTICALS:  20.2 mCi 1c-00m MDP IV

[Series 1: flow · 2.07mm/px · 6 of 48 frames shown (1 of 2)]
[frame 5/48]
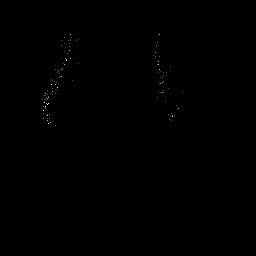
[frame 13/48  full-range]
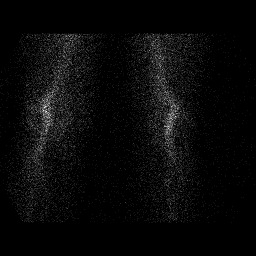
[frame 21/48  full-range]
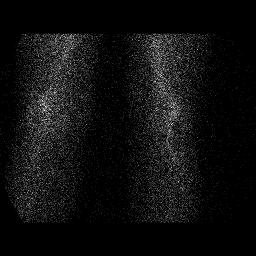
[frame 29/48  full-range]
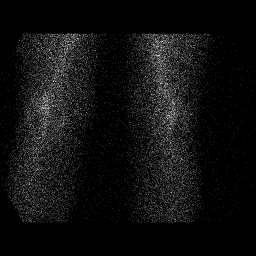
[frame 37/48  full-range]
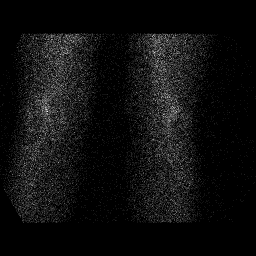
[frame 45/48  full-range]
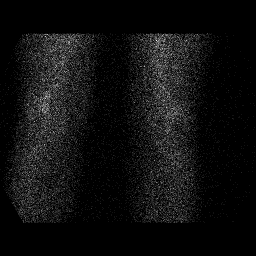

[Series 1: flow · 2.07mm/px · 6 of 48 frames shown (2 of 2)]
[frame 5/48]
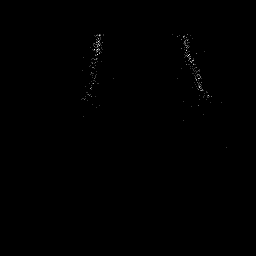
[frame 13/48  full-range]
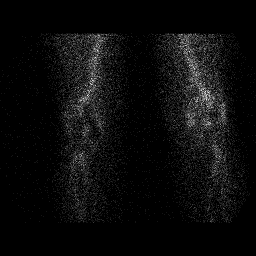
[frame 21/48  full-range]
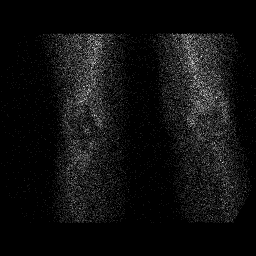
[frame 29/48  full-range]
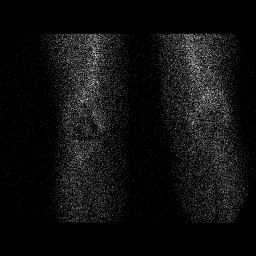
[frame 37/48  full-range]
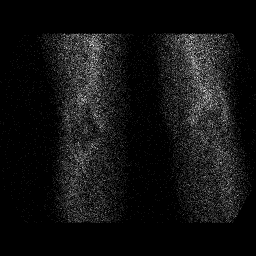
[frame 45/48  full-range]
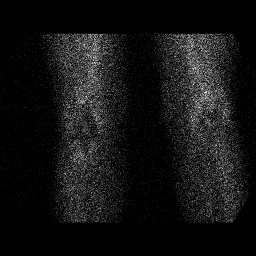

[Series 2: blood pool · 2.07mm/px · 1 of 1 slices shown (1 of 3)]
[im 1/1]
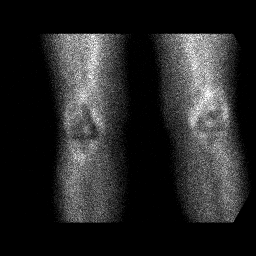

[Series 2: blood pool · 2.07mm/px · 1 of 1 slices shown (2 of 3)]
[im 1/1  full-range]
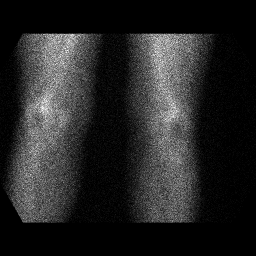

[Series 3: lat bp · 2.07mm/px · 1 of 1 slices shown (1 of 2)]
[im 1/1]
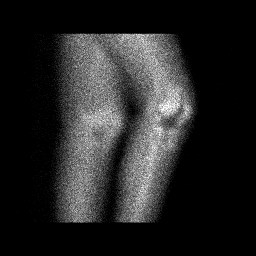

[Series 3: lat bp · 2.07mm/px · 1 of 1 slices shown (2 of 2)]
[im 1/1]
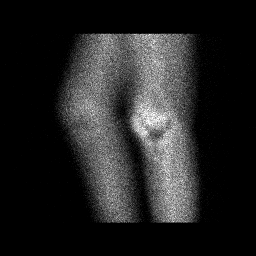

[Series 5: blood pool · 2.07mm/px · 1 of 1 slices shown (3 of 3)]
[im 1/1]
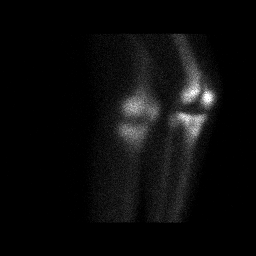

[17 of 17 positions shown; findings below may reference images not displayed]

FINDINGS: Vascular phase: Symmetric blood flow to both knees

Blood pool phase: Mild increased blood pool at the knees bilaterally
greater on LEFT

Delayed phase: Photopenic defects at both knees consistent with
prosthetic hardware. No abnormal increased delayed osseous tracer
accumulation is seen adjacent to orthopedic hardware to suggest
prosthetic loosening or infection.
IMPRESSION: Mildly increased blood pool at both knees LEFT greater than RIGHT
suggesting a hyperemic process such as synovitis.

BILATERAL knee prostheses without scintigraphic evidence of
prosthetic loosening or infection.

## 2018-10-23 ENCOUNTER — Other Ambulatory Visit: Payer: Self-pay | Admitting: Adult Health

## 2018-10-24 ENCOUNTER — Encounter: Payer: Self-pay | Admitting: Family Medicine

## 2018-10-24 NOTE — Telephone Encounter (Signed)
Left a message for a return call.

## 2018-10-24 NOTE — Telephone Encounter (Signed)
Patient is taking HCTZ 25 mg half tab a day.  Rx sent.

## 2018-10-30 ENCOUNTER — Encounter: Payer: Self-pay | Admitting: Adult Health

## 2018-10-30 ENCOUNTER — Ambulatory Visit: Payer: BLUE CROSS/BLUE SHIELD | Admitting: Adult Health

## 2018-10-30 VITALS — BP 132/82 | Temp 98.6°F | Wt 172.0 lb

## 2018-10-30 DIAGNOSIS — N3001 Acute cystitis with hematuria: Secondary | ICD-10-CM

## 2018-10-30 LAB — POC URINALSYSI DIPSTICK (AUTOMATED)
Bilirubin, UA: NEGATIVE
Blood, UA: 2
Glucose, UA: NEGATIVE
KETONES UA: NEGATIVE
Nitrite, UA: NEGATIVE
PROTEIN UA: POSITIVE — AB
SPEC GRAV UA: 1.02 (ref 1.010–1.025)
Urobilinogen, UA: 0.2 E.U./dL
pH, UA: 6 (ref 5.0–8.0)

## 2018-10-30 MED ORDER — CIPROFLOXACIN HCL 500 MG PO TABS: 500.0000 mg | ORAL_TABLET | Freq: Two times a day (BID) | ORAL | 0 refills | Status: DC

## 2018-10-30 NOTE — Progress Notes (Signed)
Subjective:    Patient ID: Rose Rose, female    DOB: [birth year], 63 y.o.   MRN: [id number]  Urinary Tract Infection   This is a new problem. The current episode started in the past 7 days. The problem occurs every urination. The quality of the pain is described as burning. There has been no fever. She is not sexually active. There is no history of pyelonephritis. Associated symptoms include frequency and urgency. Pertinent negatives include no chills, discharge, flank pain, hematuria or hesitancy. She has tried increased fluids for the symptoms. The treatment provided mild relief. There is no history of kidney stones, recurrent UTIs or a single kidney.      Review of Systems  Constitutional: Negative for activity change, appetite change and chills.  Respiratory: Negative.   Cardiovascular: Negative.   Gastrointestinal: Negative.   Genitourinary: Positive for frequency and urgency. Negative for flank pain, hematuria, hesitancy, vaginal bleeding, vaginal discharge and vaginal pain.  Musculoskeletal: Negative.   Skin: Negative.   Psychiatric/Behavioral: Negative.    Past Medical History:  Diagnosis Date  . Arthritis   . Complication of anesthesia   . Depression   . Hyperlipidemia   . Hypertension   . Insomnia   . Pneumonia   . PONV (postoperative nausea and vomiting)     Social History   Socioeconomic History  . Marital status: Married    Spouse name: Not on file  . Number of children: Not on file  . Years of education: Not on file  . Highest education level: Not on file  Occupational History  . Not on file  Social Needs  . Financial resource strain: Not on file  . Food insecurity:    Worry: Not on file    Inability: Not on file  . Transportation needs:    Medical: Not on file    Non-medical: Not on file  Tobacco Use  . Smoking status: Current Some Day Smoker    Last attempt to quit: 10/22/2002    Years since quitting: 16.0  . Smokeless tobacco: Never Used    Substance and Sexual Activity  . Alcohol use: No  . Drug use: Yes    Types: Marijuana  . Sexual activity: Not on file  Lifestyle  . Physical activity:    Days per week: Not on file    Minutes per session: Not on file  . Stress: Not on file  Relationships  . Social connections:    Talks on phone: Not on file    Gets together: Not on file    Attends religious service: Not on file    Active member of club or organization: Not on file    Attends meetings of clubs or organizations: Not on file    Relationship status: Not on file  . Intimate partner violence:    Fear of current or ex partner: Not on file    Emotionally abused: Not on file    Physically abused: Not on file    Forced sexual activity: Not on file  Other Topics Concern  . Not on file  Social History Narrative   Works at [employer] - works with [employer].    Married for 21 years   No children     Past Surgical History:  Procedure Laterality Date  . MYRINGOTOMY     Right ear  . TONSILLECTOMY    . 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 Maureen Ralphs  . TOTAL KNEE ARTHROPLASTY Left 01/16/2016  Procedure: TOTAL LEFT KNEE ARTHROPLASTY;  Surgeon: Gaynelle Arabian, MD;  Location: [facility];  Service: Orthopedics;  Laterality: Left;    Family History  Problem Relation Age of Onset  . Arthritis Mother   . Vision loss Mother   . Dementia Father   . Parkinsonism Father   . Alzheimer's disease Maternal Aunt     Allergies  Allergen Reactions  . Amoxicillin-Pot Clavulanate     REACTION: swelling  Has patient had a PCN reaction causing immediate rash, facial/tongue/throat swelling, SOB or lightheadedness with hypotension: unk Has patient had a PCN reaction causing severe rash involving mucus membranes or skin necrosis: no Has patient had a PCN reaction that required hospitalization: unk Has patient had a PCN reaction occurring within the last 10 years: yes If all of the above answers are "NO", then may proceed  with Cephalosporin use.   . Augmentin [Amoxicillin-Pot Clavulanate] Itching and Swelling  . Other   . Oxycodone Hcl Nausea And Vomiting  . Vicodin [Hydrocodone-Acetaminophen] Anxiety    itching    Current Outpatient Medications on File Prior to Visit  Medication Sig Dispense Refill  . citalopram (CELEXA) 20 MG tablet TAKE 1 TABLET TWICE A DAY 180 tablet 1  . diazepam (VALIUM) 2 MG tablet Take 1 tablet (2 mg total) by mouth every 12 (twelve) hours as needed for anxiety. 180 tablet 0  . fluticasone (FLONASE) 50 MCG/ACT nasal spray Place 2 sprays into both nostrils daily as needed for allergies or rhinitis.     . hydrochlorothiazide (HYDRODIURIL) 25 MG tablet Take half tab daily 45 tablet 0  . meclizine (ANTIVERT) 25 MG tablet Take 1 tablet (25 mg total) by mouth 3 (three) times daily as needed for dizziness. 30 tablet 1  . olopatadine (PATANOL) 0.1 % ophthalmic solution Place 1 drop into both eyes daily as needed for allergies.     . simvastatin (ZOCOR) 20 MG tablet TAKE 1 TABLET DAILY 90 tablet 2  . tiZANidine (ZANAFLEX) 4 MG tablet Take 1 tablet (4 mg total) by mouth every 8 (eight) hours as needed for up to 90 doses for muscle spasms. 90 tablet 1  . valACYclovir (VALTREX) 500 MG tablet TAKE 1 TABLET DAILY AS  NEEDED for fever blisters 90 tablet 0   No current facility-administered medications on file prior to visit.     BP 132/82   Temp 98.6 F (37 C)   Wt 172 lb (78 kg)   BMI 31.46 kg/m       Objective:   Physical Exam Vitals signs and nursing note reviewed.  Constitutional:      Appearance: Normal appearance.  Cardiovascular:     Rate and Rhythm: Normal rate and regular rhythm.     Pulses: Normal pulses.     Heart sounds: Normal heart sounds.  Pulmonary:     Effort: Pulmonary effort is normal.     Breath sounds: Normal breath sounds.  Abdominal:     General: Abdomen is flat. Bowel sounds are normal.     Palpations: Abdomen is soft. There is no mass.     Tenderness:  There is no abdominal tenderness. There is no right CVA tenderness, left CVA tenderness or guarding.  Skin:    General: Skin is warm and dry.     Capillary Refill: Capillary refill takes less than 2 seconds.  Neurological:     General: No focal deficit present.     Mental Status: She is alert.  Psychiatric:  Mood and Affect: Mood normal.        Behavior: Behavior normal.        Thought Content: Thought content normal.        Judgment: Judgment normal.       Assessment & Plan:  1. Acute cystitis with hematuria  - POCT Urinalysis Dipstick (Automated) + blood, protein, and Leuks.  - Urine Culture - ciprofloxacin (CIPRO) 500 MG tablet; Take 1 tablet (500 mg total) by mouth 2 (two) times daily.  Dispense: 6 tablet; Refill: 0  Dorothyann Peng, NP

## 2018-10-31 LAB — URINE CULTURE
MICRO NUMBER: 33796
SPECIMEN QUALITY: ADEQUATE

## 2018-11-17 ENCOUNTER — Ambulatory Visit (INDEPENDENT_AMBULATORY_CARE_PROVIDER_SITE_OTHER): Payer: BLUE CROSS/BLUE SHIELD

## 2018-11-17 ENCOUNTER — Encounter: Payer: Self-pay | Admitting: Podiatry

## 2018-11-17 ENCOUNTER — Ambulatory Visit: Payer: BLUE CROSS/BLUE SHIELD | Admitting: Podiatry

## 2018-11-17 DIAGNOSIS — M79672 Pain in left foot: Secondary | ICD-10-CM | POA: Diagnosis not present

## 2018-11-17 DIAGNOSIS — M779 Enthesopathy, unspecified: Secondary | ICD-10-CM | POA: Diagnosis not present

## 2018-11-17 NOTE — Patient Instructions (Signed)
Bunion  A bunion is a bump on the base of the big toe that forms when the bones of the big toe joint move out of position. Bunions may be small at first, but they often get larger over time. They can make walking painful. What are the causes? A bunion may be caused by:  Wearing narrow or pointed shoes that force the big toe to press against the other toes.  Abnormal foot development that causes the foot to roll inward (pronate).  Changes in the foot that are caused by certain diseases, such as rheumatoid arthritis or polio.  A foot injury. What increases the risk? The following factors may make you more likely to develop this condition:  Wearing shoes that squeeze the toes together.  Having certain diseases, such as: ? Rheumatoid arthritis. ? Polio. ? Cerebral palsy.  Having family members who have bunions.  Being born with a foot deformity, such as flat feet or low arches.  Doing activities that put a lot of pressure on the feet, such as ballet dancing. What are the signs or symptoms? The main symptom of a bunion is a noticeable bump on the big toe. Other symptoms may include:  Pain.  Swelling around the big toe.  Redness and inflammation.  Thick or hardened skin on the big toe or between the toes.  Stiffness or loss of motion in the big toe.  Trouble with walking. How is this diagnosed? A bunion may be diagnosed based on your symptoms, medical history, and activities. You may have tests, such as:  X-rays. These allow your health care provider to check the position of the bones in your foot and look for damage to your joint. They also help your health care provider determine the severity of your bunion and the best way to treat it.  Joint aspiration. In this test, a sample of fluid is removed from the toe joint. This test may be done if you are in a lot of pain. It helps rule out diseases that cause painful swelling of the joints, such as arthritis. How is this  treated? Treatment depends on the severity of your symptoms. The goal of treatment is to relieve symptoms and prevent the bunion from getting worse. Your health care provider may recommend:  Wearing shoes that have a wide toe box.  Using bunion pads to cushion the affected area.  Taping your toes together to keep them in a normal position.  Placing a device inside your shoe (orthotics) to help reduce pressure on your toe joint.  Taking medicine to ease pain, inflammation, and swelling.  Applying heat or ice to the affected area.  Doing stretching exercises.  Surgery to remove scar tissue and move the toes back into their normal position. This treatment is rare. Follow these instructions at home: Managing pain, stiffness, and swelling   If directed, put ice on the painful area: ? Put ice in a plastic bag. ? Place a towel between your skin and the bag. ? Leave the ice on for 20 minutes, 2-3 times a day. Activity   If directed, apply heat to the affected area before you exercise. Use the heat source that your health care provider recommends, such as a moist heat pack or a heating pad. ? Place a towel between your skin and the heat source. ? Leave the heat on for 20-30 minutes. ? Remove the heat if your skin turns bright red. This is especially important if you are unable to feel pain,   heat, or cold. You may have a greater risk of getting burned.  Do exercises as told by your health care provider. General instructions  Support your toe joint with proper footwear, shoe padding, or taping as told by your health care provider.  Take over-the-counter and prescription medicines only as told by your health care provider.  Keep all follow-up visits as told by your health care provider. This is important. Contact a health care provider if your symptoms:  Get worse.  Do not improve in 2 weeks. Get help right away if you have:  Severe pain and trouble with walking. Summary  A  bunion is a bump on the base of the big toe that forms when the bones of the big toe joint move out of position.  Bunions can make walking painful.  Treatment depends on the severity of your symptoms.  Support your toe joint with proper footwear, shoe padding, or taping as told by your health care provider. This information is not intended to replace advice given to you by your health care provider. Make sure you discuss any questions you have with your health care provider. Document Released: 10/08/2005 Document Revised: 02/18/2018 Document Reviewed: 02/18/2018 Elsevier Interactive Patient Education  2019 [employer]  

## 2018-11-18 ENCOUNTER — Ambulatory Visit: Payer: BLUE CROSS/BLUE SHIELD | Admitting: Adult Health

## 2018-11-18 ENCOUNTER — Encounter: Payer: Self-pay | Admitting: Adult Health

## 2018-11-18 VITALS — BP 120/68 | Temp 99.3°F | Wt 168.0 lb

## 2018-11-18 DIAGNOSIS — D4989 Neoplasm of unspecified behavior of other specified sites: Secondary | ICD-10-CM

## 2018-11-18 DIAGNOSIS — L820 Inflamed seborrheic keratosis: Secondary | ICD-10-CM | POA: Diagnosis not present

## 2018-11-18 NOTE — Progress Notes (Signed)
Subjective:    Patient ID: Rose Rose, female    DOB: Dec 01, 1955, 63 y.o.   MRN: [id number]  HPI  63 year old female who  has a past medical history of Arthritis, Complication of anesthesia, Depression, Hyperlipidemia, Hypertension, Insomnia, Pneumonia, and PONV (postoperative nausea and vomiting).  She presents to the office today for concern of a skin growth on her right upper leg. Reports first noticing this a few months ago but has become progressively larger. She denies itching or bleeding from the area.     Review of Systems See HPI   Past Medical History:  Diagnosis Date  . Arthritis   . Complication of anesthesia   . Depression   . Hyperlipidemia   . Hypertension   . Insomnia   . Pneumonia   . PONV (postoperative nausea and vomiting)     Social History   Socioeconomic History  . Marital status: Married    Spouse name: Not on file  . Number of children: Not on file  . Years of education: Not on file  . Highest education level: Not on file  Occupational History  . Not on file  Social Needs  . Financial resource strain: Not on file  . Food insecurity:    Worry: Not on file    Inability: Not on file  . Transportation needs:    Medical: Not on file    Non-medical: Not on file  Tobacco Use  . Smoking status: Current Some Day Smoker    Last attempt to quit: 10/22/2002    Years since quitting: 16.0  . Smokeless tobacco: Never Used  Substance and Sexual Activity  . Alcohol use: No  . Drug use: Yes    Types: Marijuana  . Sexual activity: Not on file  Lifestyle  . Physical activity:    Days per week: Not on file    Minutes per session: Not on file  . Stress: Not on file  Relationships  . Social connections:    Talks on phone: Not on file    Gets together: Not on file    Attends religious service: Not on file    Active member of club or organization: Not on file    Attends meetings of clubs or organizations: Not on file    Relationship status: Not on  file  . Intimate partner violence:    Fear of current or ex partner: Not on file    Emotionally abused: Not on file    Physically abused: Not on file    Forced sexual activity: Not on file  Other Topics Concern  . Not on file  Social History Narrative   Works at [employer] - works with [employer].    Married for 21 years   No children     Past Surgical History:  Procedure Laterality Date  . MYRINGOTOMY     Right ear  . TONSILLECTOMY    . 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 Maureen Ralphs  . TOTAL KNEE ARTHROPLASTY Left 01/16/2016   Procedure: TOTAL LEFT KNEE ARTHROPLASTY;  Surgeon: Gaynelle Arabian, MD;  Location: [facility];  Service: Orthopedics;  Laterality: Left;    Family History  Problem Relation Age of Onset  . Arthritis Mother   . Vision loss Mother   . Dementia Father   . Parkinsonism Father   . Alzheimer's disease Maternal Aunt     Allergies  Allergen Reactions  . Amoxicillin-Pot Clavulanate     REACTION: swelling  Has patient had a PCN reaction causing immediate rash, facial/tongue/throat swelling, SOB or lightheadedness with hypotension: unk Has patient had a PCN reaction causing severe rash involving mucus membranes or skin necrosis: no Has patient had a PCN reaction that required hospitalization: unk Has patient had a PCN reaction occurring within the last 10 years: yes If all of the above answers are "NO", then may proceed with Cephalosporin use.   . Augmentin [Amoxicillin-Pot Clavulanate] Itching and Swelling  . Other   . Oxycodone Hcl Nausea And Vomiting  . Vicodin [Hydrocodone-Acetaminophen] Anxiety    itching    Current Outpatient Medications on File Prior to Visit  Medication Sig Dispense Refill  . citalopram (CELEXA) 20 MG tablet TAKE 1 TABLET TWICE A DAY 180 tablet 1  . diazepam (VALIUM) 2 MG tablet Take 1 tablet (2 mg total) by mouth every 12 (twelve) hours as needed for anxiety. 180 tablet 0  . fluticasone (FLONASE) 50 MCG/ACT nasal  spray Place 2 sprays into both nostrils daily as needed for allergies or rhinitis.     . hydrochlorothiazide (HYDRODIURIL) 25 MG tablet Take half tab daily 45 tablet 0  . meclizine (ANTIVERT) 25 MG tablet Take 1 tablet (25 mg total) by mouth 3 (three) times daily as needed for dizziness. 30 tablet 1  . olopatadine (PATANOL) 0.1 % ophthalmic solution Place 1 drop into both eyes daily as needed for allergies.     . simvastatin (ZOCOR) 20 MG tablet TAKE 1 TABLET DAILY 90 tablet 2  . tiZANidine (ZANAFLEX) 4 MG tablet Take 1 tablet (4 mg total) by mouth every 8 (eight) hours as needed for up to 90 doses for muscle spasms. 90 tablet 1  . valACYclovir (VALTREX) 500 MG tablet TAKE 1 TABLET DAILY AS  NEEDED for fever blisters 90 tablet 0   No current facility-administered medications on file prior to visit.     BP 120/68   Temp 99.3 F (37.4 C)   Wt 168 lb (76.2 kg)   BMI 30.73 kg/m       Objective:   Physical Exam Vitals signs and nursing note reviewed.  Constitutional:      Appearance: Normal appearance.  Skin:    General: Skin is warm and dry.     Comments: 0.5 cm x 0.5 cm neoplasm on right upper lateral thigh   Neurological:     General: No focal deficit present.     Mental Status: She is alert and oriented to person, place, and time.       Assessment & Plan:  1. Neoplasm of leg Procedure including risks/benefits explained to patient.  Questions were answered. After informed consent was obtained and a time out completed, site was cleansed with betadine and then alcohol. 1% Lidocaine with epinephrine was injected under lesion and then shave biopsy was performed. Area was cauterized to obtain hemostasis.  Pt tolerated procedure well.  Specimen sent for pathology review.  Pt instructed to keep the area dry for 24 hours and to contact us if he develops redness, drainage or swelling at the site.  Pt may use tylenol as needed for discomfort today.  - Dermatology pathology  Dorothyann Peng,  NP

## 2018-11-18 NOTE — Progress Notes (Signed)
.  der

## 2018-11-19 ENCOUNTER — Other Ambulatory Visit: Payer: Self-pay | Admitting: Adult Health

## 2018-11-19 MED ORDER — PREDNISONE 20 MG PO TABS: 20.0000 mg | ORAL_TABLET | Freq: Every day | ORAL | 0 refills | Status: DC

## 2018-11-19 NOTE — Progress Notes (Signed)
Subjective: 63 year old female presents the office today for concerns of a painful bunion on the left foot.  She states that she is been wearing some different shoes that are new and this is been causing some irritation to the area.  She says it gets red when she wear shoes.  Otherwise she states that she is doing well to her foot and the surgical site the fifth toe is doing excellent.  She has no other concerns.  No recent injury. Denies any systemic complaints such as fevers, chills, nausea, vomiting. No acute changes since last appointment, and no other complaints at this time.   Objective: AAO x3, NAD DP/PT pulses palpable bilaterally, CRT less than 3 seconds Moderate bunion deformities present in the left foot with irritation along the medial first metatarsal head from shoes.  No skin breakdown or increase in warmth.  No pain or capitation at first MPJ range of motion.  No first ray hypermobility present today.  No pain of the fifth toe but very minimal swelling to the present of the fifth toe.  Adductovarus of the fourth toe.  No open lesions or pre-ulcerative lesions.  No pain with calf compression, swelling, warmth, erythema  Assessment: Left foot symptomatic bunion  Plan: -All treatment options discussed with the patient including all alternatives, risks, complications.  -X-rays were obtained reviewed.  No evidence of acute fracture or stress fracture.  Bunion deformities present. -At this time we discussed surgical as well as conservative treatment.  Continue conservative care for now.  I dispensed various offloading pads to help protect the bunion.  Also discussed shoe modifications -Patient encouraged to call the office with any questions, concerns, change in symptoms.   Trula Slade DPM

## 2018-11-20 ENCOUNTER — Other Ambulatory Visit: Payer: Self-pay | Admitting: Podiatry

## 2018-11-20 ENCOUNTER — Telehealth: Payer: Self-pay | Admitting: Adult Health

## 2018-11-20 DIAGNOSIS — M779 Enthesopathy, unspecified: Secondary | ICD-10-CM

## 2018-11-20 NOTE — Telephone Encounter (Signed)
Updated patient on pathology report

## 2018-12-01 ENCOUNTER — Other Ambulatory Visit: Payer: Self-pay | Admitting: Adult Health

## 2018-12-02 NOTE — Telephone Encounter (Signed)
Denied.

## 2018-12-22 DIAGNOSIS — L710 Perioral dermatitis: Secondary | ICD-10-CM | POA: Diagnosis not present

## 2019-01-11 ENCOUNTER — Other Ambulatory Visit: Payer: Self-pay | Admitting: Adult Health

## 2019-02-24 ENCOUNTER — Encounter: Payer: Self-pay | Admitting: Adult Health

## 2019-02-24 ENCOUNTER — Ambulatory Visit (INDEPENDENT_AMBULATORY_CARE_PROVIDER_SITE_OTHER): Payer: BLUE CROSS/BLUE SHIELD | Admitting: Adult Health

## 2019-02-24 ENCOUNTER — Other Ambulatory Visit: Payer: Self-pay

## 2019-02-24 DIAGNOSIS — F4322 Adjustment disorder with anxiety: Secondary | ICD-10-CM | POA: Diagnosis not present

## 2019-02-24 MED ORDER — CITALOPRAM HYDROBROMIDE 20 MG PO TABS: 20.0000 mg | ORAL_TABLET | Freq: Two times a day (BID) | ORAL | 1 refills | Status: DC

## 2019-02-24 MED ORDER — DIAZEPAM 2 MG PO TABS: 2.0000 mg | ORAL_TABLET | Freq: Two times a day (BID) | ORAL | 0 refills | Status: AC | PRN

## 2019-02-24 NOTE — Progress Notes (Signed)
Virtual Visit via Video Note  I connected with Rose Rose on 02/24/19 at  1:00 PM EDT by a video enabled telemedicine application and verified that I am speaking with the correct person using two identifiers.  Location patient: home Location provider:work or home office Persons participating in the virtual visit: patient, provider  I discussed the limitations of evaluation and management by telemedicine and the availability of in person appointments. The patient expressed understanding and agreed to proceed.   HPI: 63 year old female who is being evaluated today for anxiety and depression.  In the past she was on Celexa but stopped taking this when she felt better.  He reports today that she put herself back on this medication as her anxiety and depression were becoming worse, partially from being under COVID restrictions and also due to her husband's failing health.  She restarted Celexa approximately 2 to 3 weeks ago and has noticed that she is having less crying spells and feeling better overall.  She needs a refill of this medication.  She denies  suicidal ideation.  Additionally she needs a refill of valium she takes every 12 hours as needed for anxiety.  Her last refill of this medication was in November 2019  ROS: See pertinent positives and negatives per HPI.  Past Medical History:  Diagnosis Date  . Arthritis   . Complication of anesthesia   . Depression   . Hyperlipidemia   . Hypertension   . Insomnia   . Pneumonia   . PONV (postoperative nausea and vomiting)     Past Surgical History:  Procedure Laterality Date  . MYRINGOTOMY     Right ear  . TONSILLECTOMY    . 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 Maureen Ralphs  . TOTAL KNEE ARTHROPLASTY Left 01/16/2016   Procedure: TOTAL LEFT KNEE ARTHROPLASTY;  Surgeon: Gaynelle Arabian, MD;  Location: [facility];  Service: Orthopedics;  Laterality: Left;    Family History  Problem Relation Age of Onset  . Arthritis Mother   . Vision  loss Mother   . Dementia Father   . Parkinsonism Father   . Alzheimer's disease Maternal Aunt      Current Outpatient Medications:  .  citalopram (CELEXA) 20 MG tablet, Take 1 tablet (20 mg total) by mouth 2 (two) times daily., Disp: 180 tablet, Rfl: 1 .  diazepam (VALIUM) 2 MG tablet, Take 1 tablet (2 mg total) by mouth every 12 (twelve) hours as needed for anxiety., Disp: 180 tablet, Rfl: 0 .  fluticasone (FLONASE) 50 MCG/ACT nasal spray, Place 2 sprays into both nostrils daily as needed for allergies or rhinitis. , Disp: , Rfl:  .  hydrochlorothiazide (HYDRODIURIL) 25 MG tablet, TAKE 1/2 TABLET DAILY, Disp: 45 tablet, Rfl: 3 .  meclizine (ANTIVERT) 25 MG tablet, Take 1 tablet (25 mg total) by mouth 3 (three) times daily as needed for dizziness., Disp: 30 tablet, Rfl: 1 .  olopatadine (PATANOL) 0.1 % ophthalmic solution, Place 1 drop into both eyes daily as needed for allergies. , Disp: , Rfl:  .  predniSONE (DELTASONE) 20 MG tablet, Take 1 tablet (20 mg total) by mouth daily with breakfast., Disp: 7 tablet, Rfl: 0 .  simvastatin (ZOCOR) 20 MG tablet, TAKE 1 TABLET DAILY, Disp: 90 tablet, Rfl: 2 .  tiZANidine (ZANAFLEX) 4 MG tablet, Take 1 tablet (4 mg total) by mouth every 8 (eight) hours as needed for up to 90 doses for muscle spasms., Disp: 90 tablet, Rfl: 1 .  valACYclovir (VALTREX) 500  MG tablet, TAKE 1 TABLET DAILY AS  NEEDED for fever blisters, Disp: 90 tablet, Rfl: 0  EXAM:  VITALS per patient if applicable:  GENERAL: alert, oriented, appears well and in no acute distress  HEENT: atraumatic, conjunttiva clear, no obvious abnormalities on inspection of external nose and ears  NECK: normal movements of the head and neck  LUNGS: on inspection no signs of respiratory distress, breathing rate appears normal, no obvious gross SOB, gasping or wheezing  CV: no obvious cyanosis  MS: moves all visible extremities without noticeable abnormality  PSYCH/NEURO: pleasant and  cooperative, no obvious depression or anxiety, speech and thought processing grossly intact  ASSESSMENT AND PLAN:  1. Adjustment disorder with anxious mood - citalopram (CELEXA) 20 MG tablet; Take 1 tablet (20 mg total) by mouth 2 (two) times daily.  Dispense: 180 tablet; Refill: 1 - diazepam (VALIUM) 2 MG tablet; Take 1 tablet (2 mg total) by mouth every 12 (twelve) hours as needed for anxiety.  Dispense: 180 tablet; Refill: 0   I discussed the assessment and treatment plan with the patient. The patient was provided an opportunity to ask questions and all were answered. The patient agreed with the plan and demonstrated an understanding of the instructions.   The patient was advised to call back or seek an in-person evaluation if the symptoms worsen or if the condition fails to improve as anticipated.   Dorothyann Peng, NP

## 2019-09-14 ENCOUNTER — Other Ambulatory Visit: Payer: Self-pay

## 2019-09-15 ENCOUNTER — Encounter: Payer: Self-pay | Admitting: Adult Health

## 2019-09-15 ENCOUNTER — Ambulatory Visit: Payer: BC Managed Care – PPO | Admitting: Adult Health

## 2019-09-15 VITALS — BP 160/90 | Temp 96.4°F | Wt 172.0 lb

## 2019-09-15 DIAGNOSIS — Z760 Encounter for issue of repeat prescription: Secondary | ICD-10-CM

## 2019-09-15 DIAGNOSIS — Z23 Encounter for immunization: Secondary | ICD-10-CM | POA: Diagnosis not present

## 2019-09-15 DIAGNOSIS — I10 Essential (primary) hypertension: Secondary | ICD-10-CM | POA: Diagnosis not present

## 2019-09-15 DIAGNOSIS — M7631 Iliotibial band syndrome, right leg: Secondary | ICD-10-CM

## 2019-09-15 MED ORDER — VALACYCLOVIR HCL 500 MG PO TABS: ORAL_TABLET | ORAL | 0 refills | Status: DC

## 2019-09-15 MED ORDER — DIAZEPAM 2 MG PO TABS: 2.0000 mg | ORAL_TABLET | Freq: Every day | ORAL | 1 refills | Status: DC

## 2019-09-15 MED ORDER — METHYLPREDNISOLONE 4 MG PO TBPK: ORAL_TABLET | ORAL | 0 refills | Status: DC

## 2019-09-15 MED ORDER — HYDROCHLOROTHIAZIDE 12.5 MG PO CAPS: 12.5000 mg | ORAL_CAPSULE | Freq: Every day | ORAL | 1 refills | Status: DC

## 2019-09-15 NOTE — Addendum Note (Signed)
Addended by: Miles Costain T on: 09/15/2019 08:04 AM   Modules accepted: Orders

## 2019-09-15 NOTE — Progress Notes (Signed)
Subjective:    Patient ID: Rose Rose, female    DOB: 10/01/1956, 63 y.o.   MRN: [id number]  HPI   63 year old female who  has a past medical history of Arthritis, Complication of anesthesia, Depression, Hyperlipidemia, Hypertension, Insomnia, Pneumonia, and PONV (postoperative nausea and vomiting).  She presents to the office today for an acute on chronic issue for left upper leg pain. She has been seen by orthopedics for this issue and has been through two rounds of physical therapy without much improvement. Her symptoms are intermittent. Has been less since she has been working from home. Her current exacerbation started a few weeks ago. Has been trying various muscle rubs and patches. Pain is described as " aching". Pain is located along the outside of her left leg. She denies trauma or aggravating factor .   Her BP has also been running high. She has not been taking her medication on a daily basis as she finds it difficult to cut the HCTZ in half    Review of Systems See HPI   Past Medical History:  Diagnosis Date  . Arthritis   . Complication of anesthesia   . Depression   . Hyperlipidemia   . Hypertension   . Insomnia   . Pneumonia   . PONV (postoperative nausea and vomiting)     Social History   Socioeconomic History  . Marital status: Married    Spouse name: Not on file  . Number of children: Not on file  . Years of education: Not on file  . Highest education level: Not on file  Occupational History  . Not on file  Social Needs  . Financial resource strain: Not on file  . Food insecurity    Worry: Not on file    Inability: Not on file  . Transportation needs    Medical: Not on file    Non-medical: Not on file  Tobacco Use  . Smoking status: Current Some Day Smoker    Last attempt to quit: 10/22/2002    Years since quitting: 16.9  . Smokeless tobacco: Never Used  Substance and Sexual Activity  . Alcohol use: No  . Drug use: Yes    Types: Marijuana  .  Sexual activity: Not on file  Lifestyle  . Physical activity    Days per week: Not on file    Minutes per session: Not on file  . Stress: Not on file  Relationships  . Social Herbalist on phone: Not on file    Gets together: Not on file    Attends religious service: Not on file    Active member of club or organization: Not on file    Attends meetings of clubs or organizations: Not on file    Relationship status: Not on file  . Intimate partner violence    Fear of current or ex partner: Not on file    Emotionally abused: Not on file    Physically abused: Not on file    Forced sexual activity: Not on file  Other Topics Concern  . 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    Married for 21 years   No children     Past Surgical History:  Procedure Laterality Date  . MYRINGOTOMY     Right ear  . TONSILLECTOMY    . 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 Maureen Ralphs  .  TOTAL KNEE ARTHROPLASTY Left 01/16/2016   Procedure: TOTAL LEFT KNEE ARTHROPLASTY;  Surgeon: Gaynelle Arabian, MD;  Location: [facility];  Service: Orthopedics;  Laterality: Left;    Family History  Problem Relation Age of Onset  . Arthritis Mother   . Vision loss Mother   . Dementia Father   . Parkinsonism Father   . Alzheimer's disease Maternal Aunt     Allergies  Allergen Reactions  . Amoxicillin-Pot Clavulanate     REACTION: swelling  Has patient had a PCN reaction causing immediate rash, facial/tongue/throat swelling, SOB or lightheadedness with hypotension: unk Has patient had a PCN reaction causing severe rash involving mucus membranes or skin necrosis: no Has patient had a PCN reaction that required hospitalization: unk Has patient had a PCN reaction occurring within the last 10 years: yes If all of the above answers are "NO", then may proceed with Cephalosporin use.   . Augmentin [Amoxicillin-Pot Clavulanate] Itching and Swelling  .  Other   . Oxycodone Hcl Nausea And Vomiting  . Vicodin [Hydrocodone-Acetaminophen] Anxiety    itching    Current Outpatient Medications on File Prior to Visit  Medication Sig Dispense Refill  . citalopram (CELEXA) 20 MG tablet Take 1 tablet (20 mg total) by mouth 2 (two) times daily. 180 tablet 1  . diazepam (VALIUM) 2 MG tablet Take 2 mg by mouth at bedtime.    . fluticasone (FLONASE) 50 MCG/ACT nasal spray Place 2 sprays into both nostrils daily as needed for allergies or rhinitis.     Marland Kitchen olopatadine (PATANOL) 0.1 % ophthalmic solution Place 1 drop into both eyes daily as needed for allergies.     . simvastatin (ZOCOR) 20 MG tablet TAKE 1 TABLET DAILY 90 tablet 2  . tiZANidine (ZANAFLEX) 4 MG tablet Take 1 tablet (4 mg total) by mouth every 8 (eight) hours as needed for up to 90 doses for muscle spasms. 90 tablet 1  . valACYclovir (VALTREX) 500 MG tablet TAKE 1 TABLET DAILY AS  NEEDED for fever blisters 90 tablet 0  . [DISCONTINUED] hydrochlorothiazide (HYDRODIURIL) 25 MG tablet TAKE 1/2 TABLET DAILY 45 tablet 3  . meclizine (ANTIVERT) 25 MG tablet Take 1 tablet (25 mg total) by mouth 3 (three) times daily as needed for dizziness. (Patient not taking: Reported on 09/15/2019) 30 tablet 1   No current facility-administered medications on file prior to visit.     BP (!) 160/90   Temp (!) 96.4 F (35.8 C) (Temporal)   Wt 172 lb (78 kg)   BMI 31.46 kg/m       Objective:   Physical Exam Vitals signs and nursing note reviewed.  Constitutional:      Appearance: Normal appearance.  Cardiovascular:     Rate and Rhythm: Normal rate and regular rhythm.     Pulses: Normal pulses.     Heart sounds: Normal heart sounds.  Pulmonary:     Effort: Pulmonary effort is normal.     Breath sounds: Normal breath sounds.  Musculoskeletal:        General: Tenderness (along IT band at  [facility] tubercle) present.  Skin:    General: Skin is warm and dry.     Capillary Refill: Capillary refill takes  less than 2 seconds.  Neurological:     General: No focal deficit present.     Mental Status: She is alert.        Assessment & Plan:  1. It band syndrome, right - We spoke about options for treatment  including seeing Sports Medicine. She would like to try a medrol dose pack first. Recommended gentle stretching exercise - methylPREDNISolone (MEDROL DOSEPAK) 4 MG TBPK tablet; Take as directed  Dispense: 21 tablet; Refill: 0  2. Essential hypertension  - hydrochlorothiazide (MICROZIDE) 12.5 MG capsule; Take 1 capsule (12.5 mg total) by mouth daily.  Dispense: 90 capsule; Refill: 1  3. Medication refill  - hydrochlorothiazide (MICROZIDE) 12.5 MG capsule; Take 1 capsule (12.5 mg total) by mouth daily.  Dispense: 90 capsule; Refill: 1 - diazepam (VALIUM) 2 MG tablet; Take 1 tablet (2 mg total) by mouth at bedtime.  Dispense: 30 tablet; Refill: 1 - valACYclovir (VALTREX) 500 MG tablet; TAKE 1 TABLET DAILY AS  NEEDED for fever blisters  Dispense: 90 tablet; Refill: 0  Dorothyann Peng, NP

## 2019-12-01 ENCOUNTER — Other Ambulatory Visit: Payer: Self-pay

## 2019-12-02 ENCOUNTER — Other Ambulatory Visit: Payer: Self-pay

## 2019-12-02 ENCOUNTER — Other Ambulatory Visit: Payer: BC Managed Care – PPO

## 2019-12-02 ENCOUNTER — Encounter: Payer: Self-pay | Admitting: Adult Health

## 2019-12-02 ENCOUNTER — Ambulatory Visit (INDEPENDENT_AMBULATORY_CARE_PROVIDER_SITE_OTHER): Payer: BC Managed Care – PPO | Admitting: Adult Health

## 2019-12-02 VITALS — BP 130/80 | HR 71 | Temp 97.7°F | Ht 63.0 in | Wt 174.0 lb

## 2019-12-02 DIAGNOSIS — Z0000 Encounter for general adult medical examination without abnormal findings: Secondary | ICD-10-CM | POA: Diagnosis not present

## 2019-12-02 DIAGNOSIS — E782 Mixed hyperlipidemia: Secondary | ICD-10-CM

## 2019-12-02 DIAGNOSIS — I10 Essential (primary) hypertension: Secondary | ICD-10-CM

## 2019-12-02 DIAGNOSIS — F4322 Adjustment disorder with anxiety: Secondary | ICD-10-CM | POA: Diagnosis not present

## 2019-12-02 DIAGNOSIS — M79605 Pain in left leg: Secondary | ICD-10-CM

## 2019-12-02 DIAGNOSIS — R7309 Other abnormal glucose: Secondary | ICD-10-CM

## 2019-12-02 DIAGNOSIS — Z1211 Encounter for screening for malignant neoplasm of colon: Secondary | ICD-10-CM

## 2019-12-02 LAB — CBC WITH DIFFERENTIAL/PLATELET
Basophils Absolute: 0.1 10*3/uL (ref 0.0–0.1)
Basophils Relative: 0.9 % (ref 0.0–3.0)
Eosinophils Absolute: 0.2 10*3/uL (ref 0.0–0.7)
Eosinophils Relative: 2.3 % (ref 0.0–5.0)
HCT: 44 % (ref 36.0–46.0)
Hemoglobin: 14.9 g/dL (ref 12.0–15.0)
Lymphocytes Relative: 30.1 % (ref 12.0–46.0)
Lymphs Abs: 2 10*3/uL (ref 0.7–4.0)
MCHC: 33.8 g/dL (ref 30.0–36.0)
MCV: 89.9 fl (ref 78.0–100.0)
Monocytes Absolute: 0.4 10*3/uL (ref 0.1–1.0)
Monocytes Relative: 5.7 % (ref 3.0–12.0)
Neutro Abs: 4.1 10*3/uL (ref 1.4–7.7)
Neutrophils Relative %: 61 % (ref 43.0–77.0)
Platelets: 243 10*3/uL (ref 150.0–400.0)
RBC: 4.9 Mil/uL (ref 3.87–5.11)
RDW: 13.9 % (ref 11.5–15.5)
WBC: 6.8 10*3/uL (ref 4.0–10.5)

## 2019-12-02 LAB — LIPID PANEL
Cholesterol: 152 mg/dL (ref 0–200)
HDL: 58.4 mg/dL (ref >39.00)
LDL Cholesterol: 78 mg/dL (ref 0–99)
NonHDL: 94.06
Total CHOL/HDL Ratio: 3
Triglycerides: 80 mg/dL (ref 0.0–149.0)
VLDL: 16 mg/dL (ref 0.0–40.0)

## 2019-12-02 LAB — COMPREHENSIVE METABOLIC PANEL
ALT: 22 U/L (ref 0–35)
AST: 22 U/L (ref 0–37)
Albumin: 4.3 g/dL (ref 3.5–5.2)
Alkaline Phosphatase: 74 U/L (ref 39–117)
BUN: 28 mg/dL — ABNORMAL HIGH (ref 6–23)
CO2: 28 mEq/L (ref 19–32)
Calcium: 9.6 mg/dL (ref 8.4–10.5)
Chloride: 104 mEq/L (ref 96–112)
Creatinine, Ser: 0.77 mg/dL (ref 0.40–1.20)
GFR: 75.86 mL/min (ref >60.00)
Glucose, Bld: 115 mg/dL — ABNORMAL HIGH (ref 70–99)
Potassium: 4.2 mEq/L (ref 3.5–5.1)
Sodium: 141 mEq/L (ref 135–145)
Total Bilirubin: 0.5 mg/dL (ref 0.2–1.2)
Total Protein: 6.7 g/dL (ref 6.0–8.3)

## 2019-12-02 LAB — HEMOGLOBIN A1C: Hgb A1c MFr Bld: 5.7 % (ref 4.6–6.5)

## 2019-12-02 LAB — TSH: TSH: 2.22 u[IU]/mL (ref 0.35–4.50)

## 2019-12-02 MED ORDER — SIMVASTATIN 20 MG PO TABS: 20.0000 mg | ORAL_TABLET | Freq: Every day | ORAL | 3 refills | Status: DC

## 2019-12-02 MED ORDER — CITALOPRAM HYDROBROMIDE 20 MG PO TABS: 20.0000 mg | ORAL_TABLET | Freq: Two times a day (BID) | ORAL | 1 refills | Status: DC

## 2019-12-02 MED ORDER — HYDROCHLOROTHIAZIDE 12.5 MG PO CAPS: 12.5000 mg | ORAL_CAPSULE | Freq: Every day | ORAL | 3 refills | Status: DC

## 2019-12-02 MED ORDER — DIAZEPAM 2 MG PO TABS: 2.0000 mg | ORAL_TABLET | Freq: Every day | ORAL | 2 refills | Status: DC

## 2019-12-02 NOTE — Progress Notes (Signed)
Subjective:    Patient ID: Rose Rose, female    DOB: 02/08/1956, 64 y.o.   MRN: [id number]  HPI Patient presents for yearly preventative medicine examination. She is a pleasant 64 year old female who  has a past medical history of Arthritis, Complication of anesthesia, Depression, Hyperlipidemia, Hypertension, Insomnia, Pneumonia, and PONV (postoperative nausea and vomiting).  Hyperlipidemia -she is currently prescribed Zocor 20 mg daily.  She denies myalgia or fatigue Lab Results  Component Value Date   CHOL 168 10/08/2017   HDL 64.50 10/08/2017   LDLCALC 82 10/08/2017   LDLDIRECT 99.0 07/01/2015   TRIG 108.0 10/08/2017   CHOLHDL 3 10/08/2017   Anxiety and depression -currently prescribed Celexa 20 mg daily and Valium 2 mg nightly.  She feels improved but continues to have intermittent crying spells due to the failing health of her husband.  She feels as though her current regimen is at the right dose.  Essential Hypertension -takes hydrochlorothiazide 12.5 mg.  She denies dizziness, lightheadedness, chest pain, or shortness of breath BP Readings from Last 3 Encounters:  12/02/19 130/80  09/15/19 (!) 160/90  11/18/18 120/68   HSV1 -uses Valtrex as needed  Left leg pain -chronic in nature, in the past she has been seen by orthopedics as well as multiple rounds of physical therapy without much improvement.  She was last seen on 09/15/2019 for left-sided leg pain.  She was prescribed a Medrol Dosepak for suspected IT band syndrome and reports that this worked well until she is ran out of the medication.  She continues to have pain on the lateral aspect of her right upper leg, right below the knee.  Pain has improved but is not resolving.  It continues to be described as a "aching and nagging pain".  All immunizations and health maintenance protocols were reviewed with the patient and needed orders were placed. She is up to date on vaccinations   Appropriate screening laboratory values  were ordered for the patient including screening of hyperlipidemia, renal function and hepatic function.  Medication reconciliation,  past medical history, social history, problem list and allergies were reviewed in detail with the patient  Goals were established with regard to weight loss, exercise, and  diet in compliance with medications.  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  She is up-to-date on GYN exam as well as mammogram   Review of Systems  Constitutional: Negative.   HENT: Negative.   Eyes: Negative.   Respiratory: Negative.   Cardiovascular: Negative.   Gastrointestinal: Negative.   Endocrine: Negative.   Genitourinary: Negative.   Musculoskeletal: Positive for myalgias.  Skin: Negative.   Allergic/Immunologic: Negative.   Neurological: Negative.   Hematological: Negative.   Psychiatric/Behavioral: Negative.    Past Medical History:  Diagnosis Date  . Arthritis   . Complication of anesthesia   . Depression   . Hyperlipidemia   . Hypertension   . Insomnia   . Pneumonia   . PONV (postoperative nausea and vomiting)     Social History   Socioeconomic History  . Marital status: Married    Spouse name: Not on file  . Number of children: Not on file  .  Years of education: Not on file  . Highest education level: Not on file  Occupational History  . Not on file  Tobacco Use  . Smoking status: Current Some Day Smoker    Last attempt to quit: 10/22/2002    Years since quitting: 17.1  . Smokeless tobacco: Never Used  Substance and Sexual Activity  . Alcohol use: No  . Drug use: Yes    Types: Marijuana  . Sexual activity: Not on file  Other Topics Concern  . 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    Married for 21 years   No children    Social Determinants of Health   Financial Resource Strain:   . Difficulty of Paying Living Expenses: Not on file  Food Insecurity:   . Worried About Charity fundraiser in the Last Year: Not on file  . Ran Out of Food in the Last Year: Not on file  Transportation Needs:   . Lack of Transportation (Medical): Not on file  . Lack of Transportation (Non-Medical): Not on file  Physical Activity:   . Days of Exercise per Week: Not on file  . Minutes of Exercise per Session: Not on file  Stress:   . Feeling of Stress : Not on file  Social Connections:   . Frequency of Communication with Friends and Family: Not on file  . Frequency of Social Gatherings with Friends and Family: Not on file  . Attends Religious Services: Not on file  . Active Member of Clubs or Organizations: Not on file  . Attends Archivist Meetings: Not on file  . Marital Status: Not on file  Intimate Partner Violence:   . Fear of Current or Ex-Partner: Not on file  . Emotionally Abused: Not on file  . Physically Abused: Not on file  . Sexually Abused: Not on file    Past Surgical History:  Procedure Laterality Date  . MYRINGOTOMY     Right ear  . TONSILLECTOMY    . 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 Maureen Ralphs  . TOTAL KNEE ARTHROPLASTY Left 01/16/2016   Procedure: TOTAL LEFT KNEE ARTHROPLASTY;  Surgeon: Gaynelle Arabian, MD;  Location: [facility];  Service: Orthopedics;  Laterality: Left;    Family History  Problem Relation Age of Onset  . Arthritis Mother   . Vision loss Mother   . Dementia Father   . Parkinsonism Father   . Alzheimer's disease Maternal Aunt     Allergies  Allergen Reactions  . Amoxicillin-Pot Clavulanate     REACTION: swelling  Has patient had a PCN reaction causing immediate rash, facial/tongue/throat swelling, SOB or lightheadedness with hypotension: unk Has patient had a  PCN reaction causing severe rash involving mucus membranes or skin necrosis: no Has patient had a PCN reaction that required hospitalization: unk Has patient had a PCN reaction occurring within the last 10 years: yes If all of the above answers are "NO", then may proceed with Cephalosporin use.   . Augmentin [Amoxicillin-Pot Clavulanate] Itching and Swelling  . Other   . Oxycodone Hcl Nausea And Vomiting  . Vicodin [Hydrocodone-Acetaminophen] Anxiety    itching    Current Outpatient Medications on File Prior to Visit  Medication Sig Dispense Refill  . fluticasone (FLONASE) 50 MCG/ACT nasal spray Place 2 sprays into both nostrils daily as needed for allergies or rhinitis.     Marland Kitchen olopatadine (PATANOL) 0.1 % ophthalmic  solution Place 1 drop into both eyes daily as needed for allergies.     Marland Kitchen tiZANidine (ZANAFLEX) 4 MG tablet Take 1 tablet (4 mg total) by mouth every 8 (eight) hours as needed for up to 90 doses for muscle spasms. 90 tablet 1  . valACYclovir (VALTREX) 500 MG tablet TAKE 1 TABLET DAILY AS  NEEDED for fever blisters 90 tablet 0   No current facility-administered medications on file prior to visit.    BP 130/80   Pulse 71   Temp 97.7 F (36.5 C) (Other (Comment))   Ht 5\' 3"  (1.6 m)   Wt 174 lb (78.9 kg)   SpO2 96%   BMI 30.82 kg/m       Objective:   Physical Exam Vitals and nursing note reviewed.  Constitutional:      General: She is not in acute distress.    Appearance: Normal appearance. She is well-developed. She is not ill-appearing.  HENT:     Head: Normocephalic and atraumatic.     Right Ear: Tympanic membrane, ear canal and external ear normal. There is no impacted cerumen.     Left Ear: Tympanic membrane, ear canal and external ear normal. There is no impacted cerumen.     Nose: Nose normal. No congestion or rhinorrhea.     Mouth/Throat:     Mouth: Mucous membranes are moist.     Pharynx: Oropharynx is clear. No oropharyngeal exudate or posterior  oropharyngeal erythema.  Eyes:     General: No scleral icterus.       Right eye: No discharge.        Left eye: No discharge.     Extraocular Movements: Extraocular movements intact.     Conjunctiva/sclera: Conjunctivae normal.     Pupils: Pupils are equal, round, and reactive to light.  Neck:     Thyroid: No thyromegaly.     Vascular: No carotid bruit.     Trachea: No tracheal deviation.  Cardiovascular:     Rate and Rhythm: Normal rate and regular rhythm.     Pulses: Normal pulses.     Heart sounds: Normal heart sounds. No murmur. No friction rub. No gallop.   Pulmonary:     Effort: Pulmonary effort is normal. No respiratory distress.     Breath sounds: Normal breath sounds. No stridor. No wheezing, rhonchi or rales.  Chest:     Chest wall: No tenderness.  Abdominal:     General: Abdomen is flat. Bowel sounds are normal. There is no distension.     Palpations: Abdomen is soft. There is no mass.     Tenderness: There is no abdominal tenderness. There is no right CVA tenderness, left CVA tenderness, guarding or rebound.     Hernia: No hernia is present.  Musculoskeletal:        General: Tenderness present. No swelling, deformity or signs of injury. Normal range of motion.     Cervical back: Normal range of motion and neck supple.     Right lower leg: No edema.     Left lower leg: No edema.     Comments: She has tenderness with palpation to left lateral leg about 3 inch's proximal to left knee   Lymphadenopathy:     Cervical: No cervical adenopathy.  Skin:    General: Skin is warm and dry.     Coloration: Skin is not jaundiced or pale.     Findings: No bruising, erythema, lesion or rash.  Neurological:  General: No focal deficit present.     Mental Status: She is alert and oriented to person, place, and time.     Cranial Nerves: No cranial nerve deficit.     Sensory: No sensory deficit.     Motor: No weakness.     Coordination: Coordination normal.     Gait: Gait  normal.     Deep Tendon Reflexes: Reflexes normal.  Psychiatric:        Mood and Affect: Mood normal.        Behavior: Behavior normal.        Thought Content: Thought content normal.        Judgment: Judgment normal.       Assessment & Plan:  1. Routine general medical examination at a health care facility - Encouraged weight loss through diet and exercise - Follow up in one year or sooner if needed - CBC with Differential/Platelet - Comprehensive metabolic panel - Lipid panel - TSH - Hemoglobin A1c  2. Essential hypertension - No change in medication therapy - CBC with Differential/Platelet - Comprehensive metabolic panel - Lipid panel - TSH - Hemoglobin A1c - hydrochlorothiazide (MICROZIDE) 12.5 MG capsule; Take 1 capsule (12.5 mg total) by mouth daily.  Dispense: 90 capsule; Refill: 3  3. Mixed hyperlipidemia - consider change in statin  - CBC with Differential/Platelet - Comprehensive metabolic panel - Lipid panel - TSH - Hemoglobin A1c  4. Adjustment disorder with anxious mood  - CBC with Differential/Platelet - Comprehensive metabolic panel - Lipid panel - TSH - Hemoglobin A1c - diazepam (VALIUM) 2 MG tablet; Take 1 tablet (2 mg total) by mouth at bedtime.  Dispense: 30 tablet; Refill: 2 - citalopram (CELEXA) 20 MG tablet; Take 1 tablet (20 mg total) by mouth 2 (two) times daily.  Dispense: 180 tablet; Refill: 1  5. Colon cancer screening  - Cologuard  6. Left leg pain  - Ambulatory referral to Sports Medicine   Dorothyann Peng, NP

## 2019-12-11 ENCOUNTER — Encounter: Payer: BC Managed Care – PPO | Admitting: Adult Health

## 2019-12-17 ENCOUNTER — Telehealth: Payer: Self-pay | Admitting: Adult Health

## 2019-12-17 NOTE — Telephone Encounter (Signed)
Physician's Results Form to be filled out- placed in Dr's folder.  Fax to [phone] upon completion.

## 2019-12-18 NOTE — Telephone Encounter (Signed)
Pt stopped by and filled out portion of form she was responsible for. Form has been faxed with confirmation.

## 2019-12-18 NOTE — Telephone Encounter (Signed)
Form has been filled out. However, pt did not sign her portion of form. Called pt to advise. Pt will be here today to sign form. Form placed in filing cabinet in front office.

## 2020-01-04 ENCOUNTER — Other Ambulatory Visit: Payer: Self-pay | Admitting: Adult Health

## 2020-01-06 ENCOUNTER — Ambulatory Visit: Payer: Self-pay

## 2020-01-06 ENCOUNTER — Encounter: Payer: Self-pay | Admitting: Family Medicine

## 2020-01-06 ENCOUNTER — Telehealth: Payer: Self-pay | Admitting: Family Medicine

## 2020-01-06 ENCOUNTER — Ambulatory Visit: Payer: BC Managed Care – PPO | Admitting: Family Medicine

## 2020-01-06 ENCOUNTER — Other Ambulatory Visit: Payer: Self-pay

## 2020-01-06 VITALS — BP 124/82 | HR 60 | Ht 63.0 in | Wt 173.0 lb

## 2020-01-06 DIAGNOSIS — G8929 Other chronic pain: Secondary | ICD-10-CM

## 2020-01-06 DIAGNOSIS — M25561 Pain in right knee: Secondary | ICD-10-CM

## 2020-01-06 DIAGNOSIS — F4322 Adjustment disorder with anxiety: Secondary | ICD-10-CM | POA: Diagnosis not present

## 2020-01-06 DIAGNOSIS — M25562 Pain in left knee: Secondary | ICD-10-CM

## 2020-01-06 DIAGNOSIS — Z96652 Presence of left artificial knee joint: Secondary | ICD-10-CM

## 2020-01-06 MED ORDER — VITAMIN D (ERGOCALCIFEROL) 1.25 MG (50000 UNIT) PO CAPS: 50000.0000 [IU] | ORAL_CAPSULE | ORAL | 0 refills | Status: DC

## 2020-01-06 MED ORDER — HYDROXYZINE HCL 10 MG PO TABS: 10.0000 mg | ORAL_TABLET | Freq: Three times a day (TID) | ORAL | 0 refills | Status: DC | PRN

## 2020-01-06 NOTE — Assessment & Plan Note (Signed)
Significant amount of stressors.  Hydroxyzine given for breakthrough anxiety.  It is on chronic depression medications, will follow up with her primary care provider

## 2020-01-06 NOTE — Telephone Encounter (Signed)
Rx refilled with D3 ok'ed per a verbal from Dr. Tamala Julian.

## 2020-01-06 NOTE — Patient Instructions (Addendum)
Hydroxizine Vit D Once weekly Pennsaid 2x daily, fingertip sized amount See me again in 5-6 weeks

## 2020-01-06 NOTE — Progress Notes (Signed)
[place] [place] Phone: [phone] Subjective:   Rose Rose, am serving as a scribe for Dr. Hulan Saas. This visit occurred during the SARS-CoV-2 public health emergency.  Safety protocols were in place, including screening questions prior to the visit, additional usage of staff PPE, and extensive cleaning of exam room while observing appropriate contact time as indicated for disinfecting solutions.   I'm seeing this patient by the request  of:  Dorothyann Peng, NP  CC: Left knee pain  [id number]  Rose Rose is a 64 y.o. female coming in with complaint of left leg pain. Patient states that she had right knee replacement in 2012 and left in 2017 by Dr. Maureen Ralphs. Debridement in 2018 left side. Having chronic pain over IT band Has done PT which did not help. Patient has been doing lunges, sit ups. Uses Tylenol prn.  Patient states that it is always on the lateral aspect of the knee.  Sometimes severe enough that stops her from activity.  Sometimes can even wake her up at night.  Denies any associated back pain.      Past Medical History:  Diagnosis Date  . Arthritis   . Complication of anesthesia   . Depression   . Hyperlipidemia   . Hypertension   . Insomnia   . Pneumonia   . PONV (postoperative nausea and vomiting)    Past Surgical History:  Procedure Laterality Date  . MYRINGOTOMY     Right ear  . TONSILLECTOMY    . 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 Maureen Ralphs  . TOTAL KNEE ARTHROPLASTY Left 01/16/2016   Procedure: TOTAL LEFT KNEE ARTHROPLASTY;  Surgeon: Gaynelle Arabian, MD;  Location: [facility];  Service: Orthopedics;  Laterality: Left;   Social History   Socioeconomic History  . Marital status: Married    Spouse name: Not on file  . Number of children: Not on file  . Years of education: Not on file  . Highest education level: Not on file  Occupational History  . Not on file  Tobacco Use  .  Smoking status: Current Some Day Smoker    Last attempt to quit: 10/22/2002    Years since quitting: 17.2  . Smokeless tobacco: Never Used  Substance and Sexual Activity  . Alcohol use: Rose  . Drug use: Yes    Types: Marijuana  . Sexual activity: Not on file  Other Topics Concern  . 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    Married for 21 years   Rose children    Social Determinants of Radio broadcast assistant Strain:   . Difficulty of Paying Living Expenses:   Food Insecurity:   . Worried About Charity fundraiser in the Last Year:   . Arboriculturist in the Last Year:   Transportation Needs:   . Film/video editor (Medical):   Marland Kitchen Lack of Transportation (Non-Medical):   Physical Activity:   . Days of Exercise per Week:   . Minutes of Exercise per Session:   Stress:   . Feeling of Stress :   Social Connections:   . Frequency of Communication with Friends and Family:   . Frequency of Social Gatherings with Friends and Family:   . Attends Religious Services:   . Active Member of Clubs or Organizations:   . Attends Archivist Meetings:   .  Marital Status:    Allergies  Allergen Reactions  . Amoxicillin-Pot Clavulanate     REACTION: swelling  Has patient had a PCN reaction causing immediate rash, facial/tongue/throat swelling, SOB or lightheadedness with hypotension: unk Has patient had a PCN reaction causing severe rash involving mucus membranes or skin necrosis: Rose Has patient had a PCN reaction that required hospitalization: unk Has patient had a PCN reaction occurring within the last 10 years: yes If all of the above answers are "Rose", then may proceed with Cephalosporin use.   . Augmentin [Amoxicillin-Pot Clavulanate] Itching and Swelling  . Other   . Oxycodone Hcl Nausea And Vomiting  . Vicodin [Hydrocodone-Acetaminophen] Anxiety    itching   Family History  Problem Relation Age of Onset  .  Arthritis Mother   . Vision loss Mother   . Dementia Father   . Parkinsonism Father   . Alzheimer's disease Maternal Aunt      Current Outpatient Medications (Cardiovascular):  .  hydrochlorothiazide (MICROZIDE) 12.5 MG capsule, Take 1 capsule (12.5 mg total) by mouth daily. .  simvastatin (ZOCOR) 20 MG tablet, Take 1 tablet (20 mg total) by mouth daily.  Current Outpatient Medications (Respiratory):  .  fluticasone (FLONASE) 50 MCG/ACT nasal spray, Place 2 sprays into both nostrils daily as needed for allergies or rhinitis.     Current Outpatient Medications (Other):  .  citalopram (CELEXA) 20 MG tablet, Take 1 tablet (20 mg total) by mouth 2 (two) times daily. .  diazepam (VALIUM) 2 MG tablet, Take 1 tablet (2 mg total) by mouth at bedtime. Marland Kitchen  olopatadine (PATANOL) 0.1 % ophthalmic solution, Place 1 drop into both eyes daily as needed for allergies.  Marland Kitchen  tiZANidine (ZANAFLEX) 4 MG tablet, Take 1 tablet (4 mg total) by mouth every 8 (eight) hours as needed for up to 90 doses for muscle spasms. .  valACYclovir (VALTREX) 500 MG tablet, TAKE 1 TABLET DAILY AS  NEEDED for fever blisters .  hydrOXYzine (ATARAX/VISTARIL) 10 MG tablet, Take 1 tablet (10 mg total) by mouth 3 (three) times daily as needed. .  Vitamin D, Ergocalciferol, (DRISDOL) 1.25 MG (50000 UNIT) CAPS capsule, Take 1 capsule (50,000 Units total) by mouth every 7 (seven) days.   Reviewed prior external information including notes and imaging from  primary care provider As well as notes that were available from care everywhere and other healthcare systems.  Past medical history, social, surgical and family history all reviewed in electronic medical record.  Rose pertanent information unless stated regarding to the chief complaint.   Review of Systems:  Rose headache, visual changes, nausea, vomiting, diarrhea, constipation, dizziness, abdominal pain, skin rash, fevers, chills, night sweats, weight loss, swollen lymph nodes,  body aches, joint swelling, chest pain, shortness of breath, mood changes. POSITIVE muscle aches  Objective  Blood pressure 124/82, pulse 60, height 5\' 3"  (1.6 m), weight 173 lb (78.5 kg), SpO2 98 %.   General: Patient was very tearful today. HEENT: Pupils equal, extraocular movements intact  Respiratory: Patient's speak in full sentences and does not appear short of breath  Cardiovascular: Rose lower extremity edema, non tender, Rose erythema  Skin: Warm dry intact with Rose signs of infection or rash on extremities or on axial skeleton.  Abdomen: Soft nontender  Neuro: Cranial nerves II through XII are intact, neurovascularly intact in all extremities with 2+ DTRs and 2+ pulses.  Lymph: Rose lymphadenopathy of posterior or anterior cervical chain or axillae bilaterally.  Gait normal with good balance  and coordination.  MSK:  Left knee exam lacks the last 5 degrees of extension and has near full range of flexion.  Patient's incision is well-healed.  Patient has minimal discomfort over the lateral joint space.  Rose significant instability of the knee noted.  Neurovascular intact distally.  Limited musculoskeletal ultrasound was performed and interpreted by Lyndal Pulley  Limited ultrasound shows the patient does have some very mild cortical irregularity at the joint line.  Mild increase in Doppler flow.  Rose true hypoechoic changes. Impression: Nonspecific inflammation of the lateral joint line.    Impression and Recommendations:     This case required medical decision making of moderate complexity. The above documentation has been reviewed and is accurate and complete Lyndal Pulley, DO       Note: This dictation was prepared with Dragon dictation along with smaller phrase technology. Any transcriptional errors that result from this process are unintentional.

## 2020-01-06 NOTE — Telephone Encounter (Signed)
[facility] called, pt is allergic to yellow dye so she cannot take the Vit D as we rx'd as the capsules are green.  Can take D2 (coli?) 50,000 IU as these are white. Need to confirm this is [place] [phone]

## 2020-01-06 NOTE — Telephone Encounter (Signed)
PRESCRIPTION FOR WHOLE TAB SENT IN ON 12/02/2019.

## 2020-01-06 NOTE — Assessment & Plan Note (Signed)
Patient did have a replacement.  No significant instability noted today.  Patient does have good range of motion of this knee.  Pain is all over the lateral aspect of the knee.  Differential is quite broad.  Patient did have a manipulation done on the knee previously secondary to scar tissue formation in 2018.  Discussed with patient about potential compression sleeve, topical anti-inflammatories, once weekly vitamin D for strength and conditioning.  Patient does have significant amount of stress and hydroxyzine given to her as well.  Discussed with patient about the possibility of injections but I do think they are too high risk in this area for now.  Continue to have pain we will need to consider the possibility of nerve conduction test or potentially a bone scan to see if there is any loosening of the joint but patient is not having any instability.  Patient will follow-up again in 4 to 5 weeks otherwise.

## 2020-01-21 ENCOUNTER — Telehealth: Payer: Self-pay | Admitting: Adult Health

## 2020-01-21 NOTE — Telephone Encounter (Signed)
Medication Refill: Hydrochlorothiazide 25MG  or 12.5MG  (which ever one the PCP wants)   Pharmacy:[facility]  FAX: [phone] PHONE: [phone]  Reference: [id number]

## 2020-01-26 ENCOUNTER — Other Ambulatory Visit: Payer: Self-pay

## 2020-01-26 NOTE — Telephone Encounter (Signed)
A year prescription was sent to the pharmacy on 12/02/2019

## 2020-01-27 ENCOUNTER — Encounter: Payer: Self-pay | Admitting: Adult Health

## 2020-01-27 ENCOUNTER — Ambulatory Visit (INDEPENDENT_AMBULATORY_CARE_PROVIDER_SITE_OTHER): Payer: BC Managed Care – PPO | Admitting: Adult Health

## 2020-01-27 VITALS — BP 118/72 | Temp 97.7°F | Wt 171.0 lb

## 2020-01-27 DIAGNOSIS — R059 Cough, unspecified: Secondary | ICD-10-CM

## 2020-01-27 DIAGNOSIS — R05 Cough: Secondary | ICD-10-CM

## 2020-01-27 MED ORDER — HYDROCODONE-HOMATROPINE 5-1.5 MG/5ML PO SYRP: 5.0000 mL | ORAL_SOLUTION | Freq: Three times a day (TID) | ORAL | 0 refills | Status: DC | PRN

## 2020-01-27 MED ORDER — PREDNISONE 10 MG PO TABS: ORAL_TABLET | ORAL | 0 refills | Status: DC

## 2020-01-27 NOTE — Progress Notes (Signed)
Subjective:    Patient ID: Rose Rose, female    DOB: 02-15-1956, 64 y.o.   MRN: [id number]  HPI 64 year old female who  has a past medical history of Arthritis, Complication of anesthesia, Depression, Hyperlipidemia, Hypertension, Insomnia, Pneumonia, and PONV (postoperative nausea and vomiting).  She presents to the office today for an acute issue of cough that is semi productive - more so in the more morning and then turns into a dry cough throughout the day. She has associated symptoms of itchy watery eyes. She denies sinus pain and pressure, rhinorrhea, fevers, chills, shortness of breath or wheezing.    Review of Systems See HPI   Past Medical History:  Diagnosis Date  . Arthritis   . Complication of anesthesia   . Depression   . Hyperlipidemia   . Hypertension   . Insomnia   . Pneumonia   . PONV (postoperative nausea and vomiting)     Social History   Socioeconomic History  . Marital status: Married    Spouse name: Not on file  . Number of children: Not on file  . Years of education: Not on file  . Highest education level: Not on file  Occupational History  . Not on file  Tobacco Use  . Smoking status: Current Some Day Smoker    Last attempt to quit: 10/22/2002    Years since quitting: 17.2  . Smokeless tobacco: Never Used  Substance and Sexual Activity  . Alcohol use: No  . Drug use: Yes    Types: Marijuana  . Sexual activity: Not on file  Other Topics Concern  . 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    Married for 21 years   No children    Social Determinants of Radio broadcast assistant Strain:   . Difficulty of Paying Living Expenses:   Food Insecurity:   . Worried About Charity fundraiser in the Last Year:   . Arboriculturist in the Last Year:   Transportation Needs:   . Film/video editor (Medical):   Marland Kitchen Lack of Transportation (Non-Medical):   Physical Activity:   . Days of  Exercise per Week:   . Minutes of Exercise per Session:   Stress:   . Feeling of Stress :   Social Connections:   . Frequency of Communication with Friends and Family:   . Frequency of Social Gatherings with Friends and Family:   . Attends Religious Services:   . Active Member of Clubs or Organizations:   . Attends Archivist Meetings:   Marland Kitchen Marital Status:   Intimate Partner Violence:   . Fear of Current or Ex-Partner:   . Emotionally Abused:   Marland Kitchen Physically Abused:   . Sexually Abused:     Past Surgical History:  Procedure Laterality Date  . MYRINGOTOMY     Right ear  . TONSILLECTOMY    . 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 Maureen Ralphs  . TOTAL KNEE ARTHROPLASTY Left 01/16/2016   Procedure: TOTAL LEFT KNEE ARTHROPLASTY;  Surgeon: Gaynelle Arabian, MD;  Location: [facility];  Service: Orthopedics;  Laterality: Left;    Family History  Problem Relation Age of Onset  . Arthritis Mother   . Vision loss Mother   . Dementia Father   . Parkinsonism Father   . Alzheimer's disease Maternal Aunt     Allergies  Allergen Reactions  .  Amoxicillin-Pot Clavulanate     REACTION: swelling  Has patient had a PCN reaction causing immediate rash, facial/tongue/throat swelling, SOB or lightheadedness with hypotension: unk Has patient had a PCN reaction causing severe rash involving mucus membranes or skin necrosis: no Has patient had a PCN reaction that required hospitalization: unk Has patient had a PCN reaction occurring within the last 10 years: yes If all of the above answers are "NO", then may proceed with Cephalosporin use.   . Augmentin [Amoxicillin-Pot Clavulanate] Itching and Swelling  . Other   . Oxycodone Hcl Nausea And Vomiting  . Vicodin [Hydrocodone-Acetaminophen] Anxiety    itching    Current Outpatient Medications on File Prior to Visit  Medication Sig Dispense Refill  . citalopram (CELEXA) 20 MG tablet Take 1 tablet (20 mg total) by mouth 2 (two) times daily.  180 tablet 1  . diazepam (VALIUM) 2 MG tablet Take 1 tablet (2 mg total) by mouth at bedtime. 30 tablet 2  . fluticasone (FLONASE) 50 MCG/ACT nasal spray Place 2 sprays into both nostrils daily as needed for allergies or rhinitis.     . hydrochlorothiazide (MICROZIDE) 12.5 MG capsule Take 1 capsule (12.5 mg total) by mouth daily. 90 capsule 3  . hydrOXYzine (ATARAX/VISTARIL) 10 MG tablet Take 1 tablet (10 mg total) by mouth 3 (three) times daily as needed. 30 tablet 0  . olopatadine (PATANOL) 0.1 % ophthalmic solution Place 1 drop into both eyes daily as needed for allergies.     . simvastatin (ZOCOR) 20 MG tablet Take 1 tablet (20 mg total) by mouth daily. 90 tablet 3  . tiZANidine (ZANAFLEX) 4 MG tablet Take 1 tablet (4 mg total) by mouth every 8 (eight) hours as needed for up to 90 doses for muscle spasms. 90 tablet 1  . valACYclovir (VALTREX) 500 MG tablet TAKE 1 TABLET DAILY AS  NEEDED for fever blisters 90 tablet 0  . Vitamin D, Ergocalciferol, (DRISDOL) 1.25 MG (50000 UNIT) CAPS capsule Take 1 capsule (50,000 Units total) by mouth every 7 (seven) days. 12 capsule 0   No current facility-administered medications on file prior to visit.    BP 118/72   Temp 97.7 F (36.5 C) (Temporal)   Wt 171 lb (77.6 kg)   BMI 30.29 kg/m       Objective:   Physical Exam Vitals and nursing note reviewed.  Constitutional:      Appearance: Normal appearance.  HENT:     Right Ear: Tympanic membrane, ear canal and external ear normal.     Left Ear: Tympanic membrane, ear canal and external ear normal.     Nose: Nose normal. No rhinorrhea.     Right Turbinates: Enlarged.     Left Turbinates: Enlarged.     Mouth/Throat:     Mouth: Mucous membranes are dry.     Pharynx: Oropharynx is clear.  Cardiovascular:     Rate and Rhythm: Normal rate and regular rhythm.     Pulses: Normal pulses.     Heart sounds: Normal heart sounds.  Pulmonary:     Effort: Pulmonary effort is normal.     Breath  sounds: Normal breath sounds.     Comments: Dry hacking cough   Musculoskeletal:        General: Normal range of motion.  Skin:    General: Skin is dry.     Capillary Refill: Capillary refill takes less than 2 seconds.  Neurological:     General: No focal deficit present.  Mental Status: She is alert and oriented to person, place, and time.  Psychiatric:        Mood and Affect: Mood normal.        Behavior: Behavior normal.        Thought Content: Thought content normal.        Judgment: Judgment normal.       Assessment & Plan:  1. Cough - Not concerned for pneumonia. Likely allergies. Follow up in 2-3 days or sooner if needed - HYDROcodone-homatropine (HYCODAN) 5-1.5 MG/5ML syrup; Take 5 mLs by mouth every 8 (eight) hours as needed for cough.  Dispense: 120 mL; Refill: 0 - predniSONE (DELTASONE) 10 MG tablet; 40 mg x 3 days, 20 mg x 3 days, 10 mg x 3 days  Dispense: 21 tablet; Refill: 0  Dorothyann Peng, NP

## 2020-01-27 NOTE — Telephone Encounter (Signed)
Pt seen in [place] today and informed that her prescription was filled for 1 year in Feb.  She will follow up with her pharmacy.  Nothing further needed.

## 2020-02-10 ENCOUNTER — Other Ambulatory Visit: Payer: Self-pay

## 2020-02-10 ENCOUNTER — Encounter: Payer: Self-pay | Admitting: Family Medicine

## 2020-02-10 ENCOUNTER — Ambulatory Visit: Payer: BC Managed Care – PPO | Admitting: Family Medicine

## 2020-02-10 DIAGNOSIS — G8929 Other chronic pain: Secondary | ICD-10-CM

## 2020-02-10 DIAGNOSIS — F4322 Adjustment disorder with anxiety: Secondary | ICD-10-CM | POA: Diagnosis not present

## 2020-02-10 DIAGNOSIS — M25562 Pain in left knee: Secondary | ICD-10-CM | POA: Diagnosis not present

## 2020-02-10 DIAGNOSIS — Z96652 Presence of left artificial knee joint: Secondary | ICD-10-CM | POA: Diagnosis not present

## 2020-02-10 MED ORDER — HYDROXYZINE HCL 10 MG PO TABS: 10.0000 mg | ORAL_TABLET | Freq: Three times a day (TID) | ORAL | 3 refills | Status: AC | PRN

## 2020-02-10 MED ORDER — HYDROXYZINE HCL 10 MG PO TABS: 10.0000 mg | ORAL_TABLET | Freq: Three times a day (TID) | ORAL | 0 refills | Status: DC | PRN

## 2020-02-10 NOTE — Assessment & Plan Note (Signed)
Improvement noted at this time.  No significant change.  Follow-up again as needed

## 2020-02-10 NOTE — Progress Notes (Signed)
[facility] [address] [place] Phone: [phone] Subjective:   I Rose Rose am serving as a Education administrator for Dr. Hulan Saas.  This visit occurred during the SARS-CoV-2 public health emergency.  Safety protocols were in place, including screening questions prior to the visit, additional usage of staff PPE, and extensive cleaning of exam room while observing appropriate contact time as indicated for disinfecting solutions.   I'm seeing this patient by the request  of:  Dorothyann Peng, NP  CC: Left knee pain, stress follow-up  [id number]   01/06/2020 Significant amount of stressors.  Hydroxyzine given for breakthrough anxiety.  It is on chronic depression medications, will follow up with her primary care provider  Patient did have a replacement.  No significant instability noted today.  Patient does have good range of motion of this knee.  Pain is all over the lateral aspect of the knee.  Differential is quite broad.  Patient did have a manipulation done on the knee previously secondary to scar tissue formation in 2018.  Discussed with patient about potential compression sleeve, topical anti-inflammatories, once weekly vitamin D for strength and conditioning.  Patient does have significant amount of stress and hydroxyzine given to her as well.  Discussed with patient about the possibility of injections but I do think they are too high risk in this area for now.  Continue to have pain we will need to consider the possibility of nerve conduction test or potentially a bone scan to see if there is any loosening of the joint but patient is not having any instability.  Patient will follow-up again in 4 to 5 weeks otherwise.  02/10/2020 Rose Rose is a 64 y.o. female coming in with complaint of bilateral knee pain. Left knee replacement. Patient states she is doing well and would like a refill on hydroxyzine.  Patient feels that the hydroxyzine is  helping both the knee as well as with the anxiety.  Patient states that she is having much better control over her anxiety watching her significant other still going through a lot of chronic pain.       Past Medical History:  Diagnosis Date  . Arthritis   . Complication of anesthesia   . Depression   . Hyperlipidemia   . Hypertension   . Insomnia   . Pneumonia   . PONV (postoperative nausea and vomiting)    Past Surgical History:  Procedure Laterality Date  . MYRINGOTOMY     Right ear  . TONSILLECTOMY    . 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 Maureen Ralphs  . TOTAL KNEE ARTHROPLASTY Left 01/16/2016   Procedure: TOTAL LEFT KNEE ARTHROPLASTY;  Surgeon: Gaynelle Arabian, MD;  Location: [facility];  Service: Orthopedics;  Laterality: Left;   Social History   Socioeconomic History  . Marital status: Married    Spouse name: Not on file  . Number of children: Not on file  . Years of education: Not on file  . Highest education level: Not on file  Occupational History  . Not on file  Tobacco Use  . Smoking status: Current Some Day Smoker    Last attempt to quit: 10/22/2002    Years since quitting: 17.3  . Smokeless tobacco: Never Used  Substance and Sexual Activity  . Alcohol use: No  . Drug use: Yes    Types: Marijuana  . Sexual activity: Not on file  Other Topics Concern  . 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    Married for 21 years   No children    Social Determinants of Radio broadcast assistant Strain:   . Difficulty of Paying Living Expenses:   Food Insecurity:   . Worried About Charity fundraiser in the Last Year:   . Arboriculturist in the Last Year:   Transportation Needs:   . Film/video editor (Medical):   Marland Kitchen Lack of Transportation (Non-Medical):   Physical Activity:   . Days of Exercise per Week:   . Minutes of Exercise per Session:   Stress:   . Feeling of Stress :   Social Connections:     . Frequency of Communication with Friends and Family:   . Frequency of Social Gatherings with Friends and Family:   . Attends Religious Services:   . Active Member of Clubs or Organizations:   . Attends Archivist Meetings:   Marland Kitchen Marital Status:    Allergies  Allergen Reactions  . Amoxicillin-Pot Clavulanate     REACTION: swelling  Has patient had a PCN reaction causing immediate rash, facial/tongue/throat swelling, SOB or lightheadedness with hypotension: unk Has patient had a PCN reaction causing severe rash involving mucus membranes or skin necrosis: no Has patient had a PCN reaction that required hospitalization: unk Has patient had a PCN reaction occurring within the last 10 years: yes If all of the above answers are "NO", then may proceed with Cephalosporin use.   . Augmentin [Amoxicillin-Pot Clavulanate] Itching and Swelling  . Other   . Oxycodone Hcl Nausea And Vomiting  . Vicodin [Hydrocodone-Acetaminophen] Anxiety    itching   Family History  Problem Relation Age of Onset  . Arthritis Mother   . Vision loss Mother   . Dementia Father   . Parkinsonism Father   . Alzheimer's disease Maternal Aunt     Current Outpatient Medications (Endocrine & Metabolic):  .  predniSONE (DELTASONE) 10 MG tablet, 40 mg x 3 days, 20 mg x 3 days, 10 mg x 3 days  Current Outpatient Medications (Cardiovascular):  .  hydrochlorothiazide (MICROZIDE) 12.5 MG capsule, Take 1 capsule (12.5 mg total) by mouth daily. .  simvastatin (ZOCOR) 20 MG tablet, Take 1 tablet (20 mg total) by mouth daily.  Current Outpatient Medications (Respiratory):  .  fluticasone (FLONASE) 50 MCG/ACT nasal spray, Place 2 sprays into both nostrils daily as needed for allergies or rhinitis.  Marland Kitchen  HYDROcodone-homatropine (HYCODAN) 5-1.5 MG/5ML syrup, Take 5 mLs by mouth every 8 (eight) hours as needed for cough.    Current Outpatient Medications (Other):  .  citalopram (CELEXA) 20 MG tablet, Take 1 tablet  (20 mg total) by mouth 2 (two) times daily. .  diazepam (VALIUM) 2 MG tablet, Take 1 tablet (2 mg total) by mouth at bedtime. .  hydrOXYzine (ATARAX/VISTARIL) 10 MG tablet, Take 1 tablet (10 mg total) by mouth 3 (three) times daily as needed. Marland Kitchen  olopatadine (PATANOL) 0.1 % ophthalmic solution, Place 1 drop into both eyes daily as needed for allergies.  Marland Kitchen  tiZANidine (ZANAFLEX) 4 MG tablet, Take 1 tablet (4 mg total) by mouth every 8 (eight) hours as needed for up to 90 doses for muscle spasms. .  valACYclovir (VALTREX) 500 MG tablet, TAKE 1 TABLET DAILY AS  NEEDED for fever blisters .  Vitamin D, Ergocalciferol, (DRISDOL) 1.25 MG (50000 UNIT) CAPS capsule, Take 1 capsule (50,000 Units total) by  mouth every 7 (seven) days. .  hydrOXYzine (ATARAX/VISTARIL) 10 MG tablet, Take 1 tablet (10 mg total) by mouth 3 (three) times daily as needed.   Reviewed prior external information including notes and imaging from  primary care provider As well as notes that were available from care everywhere and other healthcare systems.  Past medical history, social, surgical and family history all reviewed in electronic medical record.  No pertanent information unless stated regarding to the chief complaint.   Review of Systems:  No headache, visual changes, nausea, vomiting, diarrhea, constipation, dizziness, abdominal pain, skin rash, fevers, chills, night sweats, weight loss, swollen lymph nodes, body aches, joint swelling, chest pain, shortness of breath, mood changes. POSITIVE muscle aches  Objective  Blood pressure 140/88, pulse (!) 57, height 5\' 3"  (1.6 m), weight 169 lb (76.7 kg), SpO2 96 %.   General: No apparent distress alert and oriented x3 mood and affect normal, dressed appropriately.  Much better mood than previous exam HEENT: Pupils equal, extraocular movements intact  Respiratory: Patient's speak in full sentences and does not appear short of breath  Cardiovascular: No lower extremity edema, non  tender, no erythema  Neuro: Cranial nerves II through XII are intact, neurovascularly intact in all extremities with 2+ DTRs and 2+ pulses.  Gait normal with good balance and coordination.  MSK:  Non tender with full range of motion and good stability and symmetric strength and tone of shoulders, elbows, wrist, hip, and ankles bilaterally.  Bilateral knee replacements noted.  Near full range of motion.  No significant instability noted.   Impression and Recommendations:      The above documentation has been reviewed and is accurate and complete Lyndal Pulley, DO       Note: This dictation was prepared with Dragon dictation along with smaller phrase technology. Any transcriptional errors that result from this process are unintentional.

## 2020-02-10 NOTE — Assessment & Plan Note (Signed)
Doing well with the hydroxyzine.  Sent in 90 pills with 3 refills.  Hopefully patient will continue to do well with conservative therapy.

## 2020-02-25 ENCOUNTER — Other Ambulatory Visit: Payer: Self-pay

## 2020-03-01 ENCOUNTER — Ambulatory Visit: Payer: BC Managed Care – PPO | Attending: Internal Medicine

## 2020-03-01 DIAGNOSIS — Z23 Encounter for immunization: Secondary | ICD-10-CM

## 2020-03-01 NOTE — Progress Notes (Signed)
   Covid-19 Vaccination Clinic  Name:  Keerthi Westfall    MRN: [id number] DOB: 1956-06-15  03/01/2020  Ms. Cruise was observed post Covid-19 immunization for 30 minutes based on pre-vaccination screening without incident. She was provided with Vaccine Information Sheet and instruction to access the V-Safe system.   Ms. Meckes was instructed to call 911 with any severe reactions post vaccine: Marland Kitchen Difficulty breathing  . Swelling of face and throat  . A fast heartbeat  . A bad rash all over body  . Dizziness and weakness   Immunizations Administered    Name Date Dose VIS Date Route   Pfizer COVID-19 Vaccine 03/01/2020  3:16 PM 0.3 mL 12/16/2018 Intramuscular   Manufacturer: [employer], [employer]   Lot: [id number]   [place]: [id number]

## 2020-03-02 ENCOUNTER — Other Ambulatory Visit: Payer: Self-pay | Admitting: Adult Health

## 2020-03-02 DIAGNOSIS — Z1231 Encounter for screening mammogram for malignant neoplasm of breast: Secondary | ICD-10-CM

## 2020-03-09 ENCOUNTER — Other Ambulatory Visit: Payer: Self-pay | Admitting: Adult Health

## 2020-03-09 DIAGNOSIS — F4322 Adjustment disorder with anxiety: Secondary | ICD-10-CM

## 2020-03-22 ENCOUNTER — Ambulatory Visit: Payer: BC Managed Care – PPO | Attending: Internal Medicine

## 2020-03-22 DIAGNOSIS — Z23 Encounter for immunization: Secondary | ICD-10-CM

## 2020-03-22 NOTE — Progress Notes (Signed)
   Covid-19 Vaccination Clinic  Name:  Rose Rose    MRN: [id number] DOB: 1955-11-21  03/22/2020  [name] was observed post Covid-19 immunization for 15 minutes without incident. She was provided with Vaccine Information Sheet and instruction to access the V-Safe system.   [name] was instructed to call 911 with any severe reactions post vaccine: Marland Kitchen Difficulty breathing  . Swelling of face and throat  . A fast heartbeat  . A bad rash all over body  . Dizziness and weakness   Immunizations Administered    Name Date Dose VIS Date Route   Pfizer COVID-19 Vaccine 03/22/2020  3:36 PM 0.3 mL 12/16/2018 Intramuscular   Manufacturer: [employer], [employer]   Lot: [id number]   [place]: [id number]

## 2020-04-06 ENCOUNTER — Other Ambulatory Visit: Payer: Self-pay

## 2020-04-07 ENCOUNTER — Ambulatory Visit: Payer: BC Managed Care – PPO | Admitting: Adult Health

## 2020-04-07 ENCOUNTER — Encounter: Payer: Self-pay | Admitting: Adult Health

## 2020-04-07 VITALS — BP 120/80 | Temp 98.2°F | Wt 168.0 lb

## 2020-04-07 DIAGNOSIS — M79641 Pain in right hand: Secondary | ICD-10-CM | POA: Diagnosis not present

## 2020-04-07 NOTE — Progress Notes (Signed)
Subjective:    Patient ID: Rose Rose, female    DOB: [birth year], 64 y.o.   MRN: [id number]  HPI  64 year old female who  has a past medical history of Arthritis, Complication of anesthesia, Depression, Hyperlipidemia, Hypertension, Insomnia, Pneumonia, and PONV (postoperative nausea and vomiting).  She presents to the office today for left wrist pain.  Pain has been present for approximately 10 to 14 days.  Pain is worse when doing exercises such as push-ups and when using the mouse for her computer.  She denies any trauma or aggravating injury.  She has not noticed any swelling, bruising, loss of sensation in her hand or fingers, or decreased grip strength.  Pain does not really radiate and is located in the middle of her wrist of the palmar aspect  Using a wrist splint at night and Tylenol helps relieve the pain   Review of Systems See HPI   Past Medical History:  Diagnosis Date  . Arthritis   . Complication of anesthesia   . Depression   . Hyperlipidemia   . Hypertension   . Insomnia   . Pneumonia   . PONV (postoperative nausea and vomiting)     Social History   Socioeconomic History  . Marital status: Married    Spouse name: Not on file  . Number of children: Not on file  . Years of education: Not on file  . Highest education level: Not on file  Occupational History  . Not on file  Tobacco Use  . Smoking status: Current Some Day Smoker    Last attempt to quit: 10/22/2002    Years since quitting: 17.4  . Smokeless tobacco: Never Used  Substance and Sexual Activity  . Alcohol use: No  . Drug use: Yes    Types: Marijuana  . Sexual activity: Not on file  Other Topics Concern  . 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    Married for 21 years   No children    Social Determinants of Radio broadcast assistant Strain:   . Difficulty of Paying Living Expenses:   Food Insecurity:   . Worried About Paediatric nurse in the Last Year:   . Arboriculturist in the Last Year:   Transportation Needs:   . Film/video editor (Medical):   Marland Kitchen Lack of Transportation (Non-Medical):   Physical Activity:   . Days of Exercise per Week:   . Minutes of Exercise per Session:   Stress:   . Feeling of Stress :   Social Connections:   . Frequency of Communication with Friends and Family:   . Frequency of Social Gatherings with Friends and Family:   . Attends Religious Services:   . Active Member of Clubs or Organizations:   . Attends Archivist Meetings:   Marland Kitchen Marital Status:   Intimate Partner Violence:   . Fear of Current or Ex-Partner:   . Emotionally Abused:   Marland Kitchen Physically Abused:   . Sexually Abused:     Past Surgical History:  Procedure Laterality Date  . MYRINGOTOMY     Right ear  . TONSILLECTOMY    . 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 Maureen Ralphs  . TOTAL KNEE ARTHROPLASTY Left 01/16/2016   Procedure: TOTAL LEFT KNEE ARTHROPLASTY;  Surgeon: Gaynelle Arabian, MD;  Location: [facility];  Service: Orthopedics;  Laterality: Left;    Family  History  Problem Relation Age of Onset  . Arthritis Mother   . Vision loss Mother   . Dementia Father   . Parkinsonism Father   . Alzheimer's disease Maternal Aunt     Allergies  Allergen Reactions  . Bee Venom Anaphylaxis  . Mosquito (Diagnostic) Hives and Swelling  . Blue Dyes (Parenteral) Other (See Comments)    By PCP request  . Other   . Amoxicillin-Pot Clavulanate Swelling    REACTION: swelling  Has patient had a PCN reaction causing immediate rash, facial/tongue/throat swelling, SOB or lightheadedness with hypotension: unk Has patient had a PCN reaction causing severe rash involving mucus membranes or skin necrosis: no Has patient had a PCN reaction that required hospitalization: unk Has patient had a PCN reaction occurring within the last 10 years: yes If all of the above answers are "NO", then may proceed with Cephalosporin  use.   . Augmentin [Amoxicillin-Pot Clavulanate] Itching and Swelling    Feet swell  . Oxycodone Hcl Nausea And Vomiting  . Vicodin [Hydrocodone-Acetaminophen] Anxiety    itching    Current Outpatient Medications on File Prior to Visit  Medication Sig Dispense Refill  . citalopram (CELEXA) 20 MG tablet Take 1 tablet (20 mg total) by mouth 2 (two) times daily. 180 tablet 1  . diazepam (VALIUM) 2 MG tablet TAKE 1 TABLET BY MOUTH AT BEDTIME 30 tablet 2  . fluticasone (FLONASE) 50 MCG/ACT nasal spray Place 2 sprays into both nostrils daily as needed for allergies or rhinitis.     . hydrochlorothiazide (MICROZIDE) 12.5 MG capsule Take 1 capsule (12.5 mg total) by mouth daily. 90 capsule 3  . hydrOXYzine (ATARAX/VISTARIL) 10 MG tablet Take 1 tablet (10 mg total) by mouth 3 (three) times daily as needed. 90 tablet 3  . olopatadine (PATANOL) 0.1 % ophthalmic solution Place 1 drop into both eyes daily as needed for allergies.     . simvastatin (ZOCOR) 20 MG tablet Take 1 tablet (20 mg total) by mouth daily. 90 tablet 3  . tiZANidine (ZANAFLEX) 4 MG tablet Take 1 tablet (4 mg total) by mouth every 8 (eight) hours as needed for up to 90 doses for muscle spasms. 90 tablet 1  . valACYclovir (VALTREX) 500 MG tablet TAKE 1 TABLET DAILY AS  NEEDED for fever blisters 90 tablet 0  . Vitamin D, Ergocalciferol, (DRISDOL) 1.25 MG (50000 UNIT) CAPS capsule Take 1 capsule (50,000 Units total) by mouth every 7 (seven) days. 12 capsule 0   No current facility-administered medications on file prior to visit.    BP 120/80   Temp 98.2 F (36.8 C)   Wt 168 lb (76.2 kg)   BMI 29.76 kg/m       Objective:   Physical Exam Vitals and nursing note reviewed.  Constitutional:      Appearance: Normal appearance.  Musculoskeletal:        General: Tenderness present. No swelling or deformity. Normal range of motion.     Comments: She has tenderness along the transverse carpal ligament of the right hand.  Skin:     General: Skin is warm and dry.  Neurological:     General: No focal deficit present.     Mental Status: She is alert and oriented to person, place, and time.  Psychiatric:        Mood and Affect: Mood normal.        Behavior: Behavior normal.        Thought Content: Thought content normal.  Judgment: Judgment normal.       Assessment & Plan:  1. Pain of right hand -Possibly overuse injury of the ligament.  I do not think she has any median nerve entrapment.  Advise rest, Tylenol, and icing.  Follow-up if no improvement over the next week

## 2020-05-03 ENCOUNTER — Other Ambulatory Visit: Payer: Self-pay

## 2020-05-03 ENCOUNTER — Ambulatory Visit
Admission: RE | Admit: 2020-05-03 | Discharge: 2020-05-03 | Disposition: A | Discharge status: Home / Self Care | Payer: BC Managed Care – PPO | Source: Ambulatory Visit | Attending: Adult Health | Admitting: Adult Health

## 2020-05-03 DIAGNOSIS — Z1231 Encounter for screening mammogram for malignant neoplasm of breast: Secondary | ICD-10-CM | POA: Diagnosis not present

## 2020-05-06 ENCOUNTER — Other Ambulatory Visit: Payer: Self-pay | Admitting: Adult Health

## 2020-05-06 DIAGNOSIS — F4322 Adjustment disorder with anxiety: Secondary | ICD-10-CM

## 2020-05-10 NOTE — Telephone Encounter (Signed)
SENT TO THE PHARMACY BY E-SCRIBE. 

## 2020-05-30 ENCOUNTER — Encounter: Payer: Self-pay | Admitting: Family Medicine

## 2020-05-30 ENCOUNTER — Ambulatory Visit: Payer: Self-pay

## 2020-05-30 ENCOUNTER — Other Ambulatory Visit: Payer: Self-pay

## 2020-05-30 ENCOUNTER — Ambulatory Visit: Payer: BC Managed Care – PPO | Admitting: Family Medicine

## 2020-05-30 VITALS — BP 126/88 | HR 57 | Ht 63.0 in | Wt 171.0 lb

## 2020-05-30 DIAGNOSIS — S63091A Other subluxation of right wrist and hand, initial encounter: Secondary | ICD-10-CM

## 2020-05-30 DIAGNOSIS — G5601 Carpal tunnel syndrome, right upper limb: Secondary | ICD-10-CM

## 2020-05-30 DIAGNOSIS — M25531 Pain in right wrist: Secondary | ICD-10-CM

## 2020-05-30 DIAGNOSIS — G5600 Carpal tunnel syndrome, unspecified upper limb: Secondary | ICD-10-CM | POA: Insufficient documentation

## 2020-05-30 MED ORDER — GABAPENTIN 100 MG PO CAPS
200.0000 mg | ORAL_CAPSULE | Freq: Every day | ORAL | 3 refills | Status: DC
Start: 2020-05-30 — End: 2020-12-13

## 2020-05-30 NOTE — Assessment & Plan Note (Signed)
Chronic tenosynovitis.

## 2020-05-30 NOTE — Progress Notes (Signed)
[facility] [address] [place] Phone: [phone] Subjective:   I Rose Rose am serving as a Education administrator for Dr. Hulan Saas.  This visit occurred during the SARS-CoV-2 public health emergency.  Safety protocols were in place, including screening questions prior to the visit, additional usage of staff PPE, and extensive cleaning of exam room while observing appropriate contact time as indicated for disinfecting solutions.   I'm seeing this patient by the request  of:  Dorothyann Peng, NP  CC: Right wrist pain  EVO:[id number]  Rose Rose is a 64 y.o. female coming in with complaint of right wrist pain. Patient doesn't remember MOI.   Onset- Chronic (6-8 weeks) Location - wrist, ulnar Duration- End of the day she is in the most pain  Character- throbs, sharp Aggravating factors- ADLs  Reliving factors-  Therapies tried- tylenol every night, brace at night Severity- 8/10 at its worse      Past Medical History:  Diagnosis Date  . Arthritis   . Complication of anesthesia   . Depression   . Hyperlipidemia   . Hypertension   . Insomnia   . Pneumonia   . PONV (postoperative nausea and vomiting)    Past Surgical History:  Procedure Laterality Date  . MYRINGOTOMY     Right ear  . TONSILLECTOMY    . 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 Maureen Ralphs  . TOTAL KNEE ARTHROPLASTY Left 01/16/2016   Procedure: TOTAL LEFT KNEE ARTHROPLASTY;  Surgeon: Gaynelle Arabian, MD;  Location: [facility];  Service: Orthopedics;  Laterality: Left;   Social History   Socioeconomic History  . Marital status: Married    Spouse name: Not on file  . Number of children: Not on file  . Years of education: Not on file  . Highest education level: Not on file  Occupational History  . Not on file  Tobacco Use  . Smoking status: Current Some Day Smoker    Last attempt to quit: 10/22/2002    Years since quitting: 17.6  . Smokeless tobacco: Never Used    Substance and Sexual Activity  . Alcohol use: No  . Drug use: Yes    Types: Marijuana  . Sexual activity: Not on file  Other Topics Concern  . 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    Married for 21 years   No children    Social Determinants of Radio broadcast assistant Strain:   . Difficulty of Paying Living Expenses:   Food Insecurity:   . Worried About Charity fundraiser in the Last Year:   . Arboriculturist in the Last Year:   Transportation Needs:   . Film/video editor (Medical):   Marland Kitchen Lack of Transportation (Non-Medical):   Physical Activity:   . Days of Exercise per Week:   . Minutes of Exercise per Session:   Stress:   . Feeling of Stress :   Social Connections:   . Frequency of Communication with Friends and Family:   . Frequency of Social Gatherings with Friends and Family:   . Attends Religious Services:   . Active Member of Clubs or Organizations:   . Attends Archivist Meetings:   Marland Kitchen Marital Status:    Allergies  Allergen Reactions  . Bee Venom Anaphylaxis  . Mosquito (Diagnostic) Hives and Swelling  . Blue Dyes (Parenteral) Other (See Comments)  By PCP request  . Other   . Amoxicillin-Pot Clavulanate Swelling    REACTION: swelling  Has patient had a PCN reaction causing immediate rash, facial/tongue/throat swelling, SOB or lightheadedness with hypotension: unk Has patient had a PCN reaction causing severe rash involving mucus membranes or skin necrosis: no Has patient had a PCN reaction that required hospitalization: unk Has patient had a PCN reaction occurring within the last 10 years: yes If all of the above answers are "NO", then may proceed with Cephalosporin use.   . Augmentin [Amoxicillin-Pot Clavulanate] Itching and Swelling    Feet swell  . Oxycodone Hcl Nausea And Vomiting  . Vicodin [Hydrocodone-Acetaminophen] Anxiety    itching   Family History  Problem  Relation Age of Onset  . Arthritis Mother   . Vision loss Mother   . Dementia Father   . Parkinsonism Father   . Alzheimer's disease Maternal Aunt      Current Outpatient Medications (Cardiovascular):  .  hydrochlorothiazide (MICROZIDE) 12.5 MG capsule, Take 1 capsule (12.5 mg total) by mouth daily. .  simvastatin (ZOCOR) 20 MG tablet, Take 1 tablet (20 mg total) by mouth daily.  Current Outpatient Medications (Respiratory):  .  fluticasone (FLONASE) 50 MCG/ACT nasal spray, Place 2 sprays into both nostrils daily as needed for allergies or rhinitis.     Current Outpatient Medications (Other):  .  citalopram (CELEXA) 20 MG tablet, TAKE 1 TABLET TWICE A DAY .  diazepam (VALIUM) 2 MG tablet, TAKE 1 TABLET BY MOUTH AT BEDTIME .  hydrOXYzine (ATARAX/VISTARIL) 10 MG tablet, Take 1 tablet (10 mg total) by mouth 3 (three) times daily as needed. Marland Kitchen  olopatadine (PATANOL) 0.1 % ophthalmic solution, Place 1 drop into both eyes daily as needed for allergies.  Marland Kitchen  tiZANidine (ZANAFLEX) 4 MG tablet, Take 1 tablet (4 mg total) by mouth every 8 (eight) hours as needed for up to 90 doses for muscle spasms. .  valACYclovir (VALTREX) 500 MG tablet, TAKE 1 TABLET DAILY AS  NEEDED for fever blisters .  Vitamin D, Ergocalciferol, (DRISDOL) 1.25 MG (50000 UNIT) CAPS capsule, Take 1 capsule (50,000 Units total) by mouth every 7 (seven) days. Marland Kitchen  gabapentin (NEURONTIN) 100 MG capsule, Take 2 capsules (200 mg total) by mouth at bedtime.   Reviewed prior external information including notes and imaging from  primary care provider As well as notes that were available from care everywhere and other healthcare systems.  Past medical history, social, surgical and family history all reviewed in electronic medical record.  No pertanent information unless stated regarding to the chief complaint.   Review of Systems:  No headache, visual changes, nausea, vomiting, diarrhea, constipation, dizziness, abdominal pain,  skin rash, fevers, chills, night sweats, weight loss, swollen lymph nodes, body aches, joint swelling, chest pain, shortness of breath, mood changes. POSITIVE muscle aches  Objective  Blood pressure 126/88, pulse (!) 57, height 5\' 3"  (1.6 m), weight 171 lb (77.6 kg), SpO2 97 %.   General: No apparent distress alert and oriented x3 mood and affect normal, dressed appropriately.  HEENT: Pupils equal, extraocular movements intact  Respiratory: Patient's speak in full sentences and does not appear short of breath  Cardiovascular: No lower extremity edema, non tender, no erythema  Neuro: Cranial nerves II through XII are intact, neurovascularly intact in all extremities with 2+ DTRs and 2+ pulses.  Gait normal with good balance and coordination.  Knee replacements noted MSK: Right wrist exam shows the patient does have swelling over the  ulnar aspect of the wrist.  Seems to be over the ECU nontender to palpation.  Positive Tinel's noted.  Good grip strength though.  Patient over the pisiform bone does have what appears to be a cyst that is tender to palpation. Contralateral side unremarkable  Limited musculoskeletal ultrasound was performed and interpreted by Lyndal Pulley  Limited ultrasound of patient's right wrist shows the patient does have significant hypoechoic changes within the ECU tendon sheath.  No chronic subluxation noted at the moment.  Patient does have significant enlargement of the median nerve with an area of 0.18 cm area.  Small ganglion cyst noted on the palmar aspect of the wrist Impression: Left carpal tunnel with ECU tenosynovitis    97110; 15 additional minutes spent for Therapeutic exercises as stated in above notes.  This included exercises focusing on stretching, strengthening, with significant focus on eccentric aspects.   Long term goals include an improvement in range of motion, strength, endurance as well as avoiding reinjury. Patient's frequency would include in 1-2 times  a day, 3-5 times a week for a duration of 6-12 weeks. We discussed the anatomy involved, and that carpal tunnel syndrome primarily involves the median nerve, and this typically affects digits one through 3.   We also discussed that mild cases of carpal tunnel syndrome are often improved with night splints.  If symptoms persist it is very reasonable to consider a carpal tunnel injection.   If the patient does have moderate to severe carpal tunnel syndrome based on NCV, then it is certainly reasonable to consider surgical consultation for definitive management possible carpal tunnel release.  We also discussed his severe carpal tunnel syndrome can lead to permanent nerve impairment even if released. At this point, the patient like to proceed conservatively.  Proper technique shown and discussed handout in great detail with ATC.  All questions were discussed and answered.   Impression and Recommendations:     The above documentation has been reviewed and is accurate and complete Lyndal Pulley, DO       Note: This dictation was prepared with Dragon dictation along with smaller phrase technology. Any transcriptional errors that result from this process are unintentional.

## 2020-05-30 NOTE — Patient Instructions (Addendum)
Good to see you [place] tape during the day Note for vertical mouse Wrist exercises Gabapentin 200 at night See me again in 5-6 weeks we can consider injection

## 2020-06-15 ENCOUNTER — Other Ambulatory Visit: Payer: Self-pay | Admitting: Adult Health

## 2020-06-15 DIAGNOSIS — F4322 Adjustment disorder with anxiety: Secondary | ICD-10-CM

## 2020-06-20 ENCOUNTER — Other Ambulatory Visit: Payer: Self-pay

## 2020-06-21 ENCOUNTER — Encounter: Payer: Self-pay | Admitting: Adult Health

## 2020-06-21 ENCOUNTER — Telehealth: Payer: Self-pay | Admitting: Adult Health

## 2020-06-21 ENCOUNTER — Ambulatory Visit: Payer: BC Managed Care – PPO | Admitting: Adult Health

## 2020-06-21 VITALS — BP 120/80 | HR 68 | Temp 98.6°F | Wt 171.9 lb

## 2020-06-21 DIAGNOSIS — H8113 Benign paroxysmal vertigo, bilateral: Secondary | ICD-10-CM | POA: Diagnosis not present

## 2020-06-21 MED ORDER — MECLIZINE HCL 50 MG PO TABS: 50.0000 mg | ORAL_TABLET | Freq: Three times a day (TID) | ORAL | 0 refills | Status: DC | PRN

## 2020-06-21 MED ORDER — ONDANSETRON HCL 4 MG PO TABS: 4.0000 mg | ORAL_TABLET | Freq: Three times a day (TID) | ORAL | 1 refills | Status: AC | PRN

## 2020-06-21 MED ORDER — MECLIZINE HCL 25 MG PO TABS: 25.0000 mg | ORAL_TABLET | Freq: Two times a day (BID) | ORAL | 0 refills | Status: DC

## 2020-06-21 NOTE — Progress Notes (Signed)
Subjective:    Patient ID: Rose Rose, female    DOB: 06/05/1956, 64 y.o.   MRN: [id number]  HPI 64 year old female who  has a past medical history of Arthritis, Complication of anesthesia, Depression, Hyperlipidemia, Hypertension, Insomnia, Pneumonia, and PONV (postoperative nausea and vomiting).  She presents to the office today with acute complaint of dizziness and nausea.  Her symptoms have been present for approximately 3 days.  She reports a constant sensation of "the room is spinning".  She does have a history of vertigo and this feels exactly the same.  Spinning is worse when changing positions.  The dizziness causes her to feel nauseated but she has not vomited.   Review of Systems See HPI   Past Medical History:  Diagnosis Date  . Arthritis   . Complication of anesthesia   . Depression   . Hyperlipidemia   . Hypertension   . Insomnia   . Pneumonia   . PONV (postoperative nausea and vomiting)     Social History   Socioeconomic History  . Marital status: Married    Spouse name: Not on file  . Number of children: Not on file  . Years of education: Not on file  . Highest education level: Not on file  Occupational History  . Not on file  Tobacco Use  . Smoking status: Current Some Day Smoker    Last attempt to quit: 10/22/2002    Years since quitting: 17.6  . Smokeless tobacco: Never Used  Substance and Sexual Activity  . Alcohol use: No  . Drug use: Yes    Types: Marijuana  . Sexual activity: Not on file  Other Topics Concern  . 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    Married for 21 years   No children    Social Determinants of Health   Financial Resource Strain:   . Difficulty of Paying Living Expenses: Not on file  Food Insecurity:   . Worried About Charity fundraiser in the Last Year: Not on file  . Ran Out of Food in the Last Year: Not on file  Transportation Needs:   . Lack of  Transportation (Medical): Not on file  . Lack of Transportation (Non-Medical): Not on file  Physical Activity:   . Days of Exercise per Week: Not on file  . Minutes of Exercise per Session: Not on file  Stress:   . Feeling of Stress : Not on file  Social Connections:   . Frequency of Communication with Friends and Family: Not on file  . Frequency of Social Gatherings with Friends and Family: Not on file  . Attends Religious Services: Not on file  . Active Member of Clubs or Organizations: Not on file  . Attends Archivist Meetings: Not on file  . Marital Status: Not on file  Intimate Partner Violence:   . Fear of Current or Ex-Partner: Not on file  . Emotionally Abused: Not on file  . Physically Abused: Not on file  . Sexually Abused: Not on file    Past Surgical History:  Procedure Laterality Date  . MYRINGOTOMY     Right ear  . TONSILLECTOMY    . 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 Maureen Ralphs  . TOTAL KNEE ARTHROPLASTY Left 01/16/2016   Procedure: TOTAL LEFT KNEE ARTHROPLASTY;  Surgeon: Gaynelle Arabian, MD;  Location: [facility];  Service: Orthopedics;  Laterality: Left;  Family History  Problem Relation Age of Onset  . Arthritis Mother   . Vision loss Mother   . Dementia Father   . Parkinsonism Father   . Alzheimer's disease Maternal Aunt     Allergies  Allergen Reactions  . Bee Venom Anaphylaxis  . Mosquito (Diagnostic) Hives and Swelling  . Blue Dyes (Parenteral) Other (See Comments)    By PCP request  . Other   . Amoxicillin-Pot Clavulanate Swelling    REACTION: swelling  Has patient had a PCN reaction causing immediate rash, facial/tongue/throat swelling, SOB or lightheadedness with hypotension: unk Has patient had a PCN reaction causing severe rash involving mucus membranes or skin necrosis: no Has patient had a PCN reaction that required hospitalization: unk Has patient had a PCN reaction occurring within the last 10 years: yes If all of the  above answers are "NO", then may proceed with Cephalosporin use.   . Augmentin [Amoxicillin-Pot Clavulanate] Itching and Swelling    Feet swell  . Oxycodone Hcl Nausea And Vomiting  . Vicodin [Hydrocodone-Acetaminophen] Anxiety    itching    Current Outpatient Medications on File Prior to Visit  Medication Sig Dispense Refill  . citalopram (CELEXA) 20 MG tablet TAKE 1 TABLET TWICE A DAY 180 tablet 1  . diazepam (VALIUM) 2 MG tablet TAKE 1 TABLET BY MOUTH AT BEDTIME 30 tablet 2  . fluticasone (FLONASE) 50 MCG/ACT nasal spray Place 2 sprays into both nostrils daily as needed for allergies or rhinitis.     Marland Kitchen gabapentin (NEURONTIN) 100 MG capsule Take 2 capsules (200 mg total) by mouth at bedtime. 180 capsule 3  . hydrochlorothiazide (MICROZIDE) 12.5 MG capsule Take 1 capsule (12.5 mg total) by mouth daily. 90 capsule 3  . hydrOXYzine (ATARAX/VISTARIL) 10 MG tablet Take 1 tablet (10 mg total) by mouth 3 (three) times daily as needed. 90 tablet 3  . olopatadine (PATANOL) 0.1 % ophthalmic solution Place 1 drop into both eyes daily as needed for allergies.     . simvastatin (ZOCOR) 20 MG tablet Take 1 tablet (20 mg total) by mouth daily. 90 tablet 3  . tiZANidine (ZANAFLEX) 4 MG tablet Take 1 tablet (4 mg total) by mouth every 8 (eight) hours as needed for up to 90 doses for muscle spasms. 90 tablet 1  . valACYclovir (VALTREX) 500 MG tablet TAKE 1 TABLET DAILY AS  NEEDED for fever blisters 90 tablet 0  . Vitamin D, Ergocalciferol, (DRISDOL) 1.25 MG (50000 UNIT) CAPS capsule Take 1 capsule (50,000 Units total) by mouth every 7 (seven) days. 12 capsule 0   No current facility-administered medications on file prior to visit.    BP 120/80 (BP Location: Left Arm, Patient Position: Sitting, Cuff Size: Normal)   Pulse 68   Temp 98.6 F (37 C) (Oral)   Wt 171 lb 14.4 oz (78 kg)   SpO2 95%   BMI 30.45 kg/m       Objective:   Physical Exam Vitals and nursing note reviewed.  Constitutional:       Appearance: Normal appearance.  Eyes:     General: No scleral icterus.       Right eye: No discharge.        Left eye: No discharge.     Extraocular Movements: Extraocular movements intact.     Right eye: Nystagmus present.     Left eye: Nystagmus present.     Pupils: Pupils are equal, round, and reactive to light.  Cardiovascular:     Rate  and Rhythm: Normal rate and regular rhythm.     Pulses: Normal pulses.     Heart sounds: Normal heart sounds.  Pulmonary:     Effort: Pulmonary effort is normal.     Breath sounds: Normal breath sounds.  Musculoskeletal:        General: Normal range of motion.  Skin:    General: Skin is warm and dry.  Neurological:     General: No focal deficit present.     Mental Status: She is alert and oriented to person, place, and time.  Psychiatric:        Mood and Affect: Mood normal.        Behavior: Behavior normal.        Thought Content: Thought content normal.        Judgment: Judgment normal.       Assessment & Plan:  1. Benign paroxysmal positional vertigo due to bilateral vestibular disorder - She has done well with Meclizine in the past.  - meclizine (ANTIVERT) 50 MG tablet; Take 1 tablet (50 mg total) by mouth 3 (three) times daily as needed.  Dispense: 30 tablet; Refill: 0 - ondansetron (ZOFRAN) 4 MG tablet; Take 1 tablet (4 mg total) by mouth every 8 (eight) hours as needed for nausea or vomiting.  Dispense: 20 tablet; Refill: 1   Dorothyann Peng, NP

## 2020-06-21 NOTE — Telephone Encounter (Signed)
She is at [facility] is wondering if you can change the meclizine (ANTIVERT) 50 MG tablet to 25 mg twice daily because the 50 MG only comes in name brand and is $142 and if you do 25 MG twice daily it is only $18  [place], [place] [address] Phone:  [phone]  Fax:  [phone]

## 2020-06-22 NOTE — Telephone Encounter (Signed)
done

## 2020-07-11 NOTE — Progress Notes (Signed)
[place] Phone: [phone] Subjective:   Rose Rose, am serving as a scribe for Dr. Hulan Saas. This visit occurred during the SARS-CoV-2 public health emergency.  Safety protocols were in place, including screening questions prior to the visit, additional usage of staff PPE, and extensive cleaning of exam room while observing appropriate contact time as indicated for disinfecting solutions.   I'm seeing this patient by the request  of:  Dorothyann Peng, NP  CC: Wrist pain follow-up  [id number]   05/30/2020 Chronic tenosynovitis.  Update 07/12/2020 Rose Rose is a 64 y.o. female coming in with complaint of right wrist pain. Patient states that her pain is unchanged. If she uses her hand a lot her pain increases. Has been packing for a move in March. Does wear brace at night.        Past Medical History:  Diagnosis Date  . Arthritis   . Complication of anesthesia   . Depression   . Hyperlipidemia   . Hypertension   . Insomnia   . Pneumonia   . PONV (postoperative nausea and vomiting)    Past Surgical History:  Procedure Laterality Date  . MYRINGOTOMY     Right ear  . TONSILLECTOMY    . 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 Maureen Ralphs  . TOTAL KNEE ARTHROPLASTY Left 01/16/2016   Procedure: TOTAL LEFT KNEE ARTHROPLASTY;  Surgeon: Gaynelle Arabian, MD;  Location: [facility];  Service: Orthopedics;  Laterality: Left;   Social History   Socioeconomic History  . Marital status: Married    Spouse name: Not on file  . Number of children: Not on file  . Years of education: Not on file  . Highest education level: Not on file  Occupational History  . Not on file  Tobacco Use  . Smoking status: Current Some Day Smoker    Last attempt to quit: 10/22/2002    Years since quitting: 17.7  . Smokeless tobacco: Never Used  Substance and Sexual Activity  . Alcohol use: Rose  . Drug use: Yes    Types:  Marijuana  . Sexual activity: Not on file  Other Topics Concern  . 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    Married for 21 years   Rose children    Social Determinants of Health   Financial Resource Strain:   . Difficulty of Paying Living Expenses: Not on file  Food Insecurity:   . Worried About Charity fundraiser in the Last Year: Not on file  . Ran Out of Food in the Last Year: Not on file  Transportation Needs:   . Lack of Transportation (Medical): Not on file  . Lack of Transportation (Non-Medical): Not on file  Physical Activity:   . Days of Exercise per Week: Not on file  . Minutes of Exercise per Session: Not on file  Stress:   . Feeling of Stress : Not on file  Social Connections:   . Frequency of Communication with Friends and Family: Not on file  . Frequency of Social Gatherings with Friends and Family: Not on file  . Attends Religious Services: Not on file  . Active Member of Clubs or Organizations: Not on file  . Attends Archivist Meetings: Not on file  . Marital Status: Not on file   Allergies  Allergen Reactions  . Bee Venom Anaphylaxis  .  Mosquito (Diagnostic) Hives and Swelling  . Blue Dyes (Parenteral) Other (See Comments)    By PCP request  . Other   . Amoxicillin-Pot Clavulanate Swelling    REACTION: swelling  Has patient had a PCN reaction causing immediate rash, facial/tongue/throat swelling, SOB or lightheadedness with hypotension: unk Has patient had a PCN reaction causing severe rash involving mucus membranes or skin necrosis: Rose Has patient had a PCN reaction that required hospitalization: unk Has patient had a PCN reaction occurring within the last 10 years: yes If all of the above answers are "Rose", then may proceed with Cephalosporin use.   . Augmentin [Amoxicillin-Pot Clavulanate] Itching and Swelling    Feet swell  . Oxycodone Hcl Nausea And Vomiting  . Vicodin  [Hydrocodone-Acetaminophen] Anxiety    itching   Family History  Problem Relation Age of Onset  . Arthritis Mother   . Vision loss Mother   . Dementia Father   . Parkinsonism Father   . Alzheimer's disease Maternal Aunt      Current Outpatient Medications (Cardiovascular):  .  hydrochlorothiazide (MICROZIDE) 12.5 MG capsule, Take 1 capsule (12.5 mg total) by mouth daily. .  simvastatin (ZOCOR) 20 MG tablet, Take 1 tablet (20 mg total) by mouth daily.  Current Outpatient Medications (Respiratory):  .  fluticasone (FLONASE) 50 MCG/ACT nasal spray, Place 2 sprays into both nostrils daily as needed for allergies or rhinitis.     Current Outpatient Medications (Other):  .  citalopram (CELEXA) 20 MG tablet, TAKE 1 TABLET TWICE A DAY .  diazepam (VALIUM) 2 MG tablet, TAKE 1 TABLET BY MOUTH AT BEDTIME .  gabapentin (NEURONTIN) 100 MG capsule, Take 2 capsules (200 mg total) by mouth at bedtime. .  hydrOXYzine (ATARAX/VISTARIL) 10 MG tablet, Take 1 tablet (10 mg total) by mouth 3 (three) times daily as needed. .  meclizine (ANTIVERT) 25 MG tablet, Take 1 tablet (25 mg total) by mouth 2 (two) times daily. Marland Kitchen  olopatadine (PATANOL) 0.1 % ophthalmic solution, Place 1 drop into both eyes daily as needed for allergies.  Marland Kitchen  ondansetron (ZOFRAN) 4 MG tablet, Take 1 tablet (4 mg total) by mouth every 8 (eight) hours as needed for nausea or vomiting. Marland Kitchen  tiZANidine (ZANAFLEX) 4 MG tablet, Take 1 tablet (4 mg total) by mouth every 8 (eight) hours as needed for up to 90 doses for muscle spasms. .  valACYclovir (VALTREX) 500 MG tablet, TAKE 1 TABLET DAILY AS  NEEDED for fever blisters .  Vitamin D, Ergocalciferol, (DRISDOL) 1.25 MG (50000 UNIT) CAPS capsule, Take 1 capsule (50,000 Units total) by mouth every 7 (seven) days.   Reviewed prior external information including notes and imaging from  primary care provider As well as notes that were available from care everywhere and other healthcare  systems.  Past medical history, social, surgical and family history all reviewed in electronic medical record.  Rose pertanent information unless stated regarding to the chief complaint.   Review of Systems:  Rose headache, visual changes, nausea, vomiting, diarrhea, constipation, dizziness, abdominal pain, skin rash, fevers, chills, night sweats, weight loss, swollen lymph nodes, body aches, joint swelling, chest pain, shortness of breath, mood changes. POSITIVE muscle aches  Objective  Blood pressure (!) 124/94, pulse 66, height 5\' 3"  (1.6 m), weight 173 lb (78.5 kg), SpO2 98 %.   General: Rose apparent distress alert and oriented x3 mood and affect normal, dressed appropriately.  HEENT: Pupils equal, extraocular movements intact  Respiratory: Patient's speak in full sentences and  does not appear short of breath  Cardiovascular: Rose lower extremity edema, non tender, Rose erythema  Neuro: Cranial nerves II through XII are intact, neurovascularly intact in all extremities with 2+ DTRs and 2+ pulses.  Gait normal with good balance and coordination.  MSK:  Right wrist exam shows the patient does have a positive Tinel's.  Patient does have good grip strength noted.  Rose atrophy of the hand at this time.  Less tenderness of the extensor carpi ulnaris   Procedure: Real-time Ultrasound Guided Injection of right carpal tunnel Device: GE Logiq Q7  Ultrasound guided injection is preferred based studies that show increased duration, increased effect, greater accuracy, decreased procedural pain, increased response rate with ultrasound guided versus blind injection.  Verbal informed consent obtained.  Time-out conducted.  Noted Rose overlying erythema, induration, or other signs of local infection.  Skin prepped in a sterile fashion.  Local anesthesia: Topical Ethyl chloride.  With sterile technique and under real time ultrasound guidance:  median nerve visualized.  23g 5/8 inch needle inserted distal to  proximal approach into nerve sheath. Pictures taken nfor needle placement. Patient did have injection of 2 cc of 1% lidocaine, 1 cc of 0.5% Marcaine, and 1 cc of Kenalog 40 mg/dL. Completed without difficulty  Pain immediately resolved suggesting accurate placement of the medication.  Advised to call if fevers/chills, erythema, induration, drainage, or persistent bleeding.   Impression: Technically successful ultrasound guided injection.     Impression and Recommendations:     The above documentation has been reviewed and is accurate and complete Rose Pulley, DO       Note: This dictation was prepared with Dragon dictation along with smaller phrase technology. Any transcriptional errors that result from this process are unintentional.

## 2020-07-12 ENCOUNTER — Ambulatory Visit: Payer: Self-pay

## 2020-07-12 ENCOUNTER — Ambulatory Visit: Payer: BC Managed Care – PPO | Admitting: Family Medicine

## 2020-07-12 ENCOUNTER — Encounter: Payer: Self-pay | Admitting: Family Medicine

## 2020-07-12 ENCOUNTER — Other Ambulatory Visit: Payer: Self-pay

## 2020-07-12 VITALS — BP 124/94 | HR 66 | Ht 63.0 in | Wt 173.0 lb

## 2020-07-12 DIAGNOSIS — M25531 Pain in right wrist: Secondary | ICD-10-CM | POA: Diagnosis not present

## 2020-07-12 DIAGNOSIS — G5601 Carpal tunnel syndrome, right upper limb: Secondary | ICD-10-CM | POA: Diagnosis not present

## 2020-07-12 NOTE — Assessment & Plan Note (Signed)
Patient given injection today and tolerated the procedure well.  Patient knows that this can be short-lived but I expect this to do relatively well.  On ultrasound today does seem to have decreasing amount of inflammation of the extensor carpi ulnaris so I would like patient to continue with pain.  Follow-up with me again 2 to 3 months.  Continue same medications for this chronic problem with exacerbation including the gabapentin.

## 2020-07-12 NOTE — Patient Instructions (Signed)
Injected wrist today Continue to wear brace at night See me again in 2-3 months

## 2020-07-26 ENCOUNTER — Other Ambulatory Visit: Payer: Self-pay | Admitting: *Deleted

## 2020-07-26 DIAGNOSIS — F4322 Adjustment disorder with anxiety: Secondary | ICD-10-CM

## 2020-07-26 MED ORDER — CITALOPRAM HYDROBROMIDE 20 MG PO TABS: 20.0000 mg | ORAL_TABLET | Freq: Two times a day (BID) | ORAL | 1 refills | Status: AC

## 2020-08-23 ENCOUNTER — Other Ambulatory Visit: Payer: Self-pay

## 2020-08-23 ENCOUNTER — Encounter: Payer: Self-pay | Admitting: Adult Health

## 2020-08-23 ENCOUNTER — Ambulatory Visit: Payer: BC Managed Care – PPO | Admitting: Adult Health

## 2020-08-23 VITALS — BP 132/80 | HR 56 | Temp 98.8°F | Ht 63.0 in | Wt 172.8 lb

## 2020-08-23 DIAGNOSIS — N3001 Acute cystitis with hematuria: Secondary | ICD-10-CM | POA: Diagnosis not present

## 2020-08-23 LAB — POC URINALSYSI DIPSTICK (AUTOMATED)
Bilirubin, UA: NEGATIVE
Glucose, UA: NEGATIVE
Ketones, UA: NEGATIVE
Nitrite, UA: NEGATIVE
Protein, UA: POSITIVE — AB
Spec Grav, UA: 1.015 (ref 1.010–1.025)
Urobilinogen, UA: 0.2 E.U./dL
pH, UA: 6.5 (ref 5.0–8.0)

## 2020-08-23 MED ORDER — CIPROFLOXACIN HCL 500 MG PO TABS: 500.0000 mg | ORAL_TABLET | Freq: Two times a day (BID) | ORAL | 0 refills | Status: DC

## 2020-08-23 NOTE — Progress Notes (Signed)
Subjective:    Patient ID: Rose Rose, female    DOB: 1956-08-17, 64 y.o.   MRN: [id number]  HPI 64 year old female who  has a past medical history of Arthritis, Complication of anesthesia, Depression, Hyperlipidemia, Hypertension, Insomnia, Pneumonia, and PONV (postoperative nausea and vomiting).  She presents to the office today for an acute issue of concern for UTI. She reports that her symptoms started about 2 days ago.  Her symptoms include left-sided low back pain, urinary frequency, urgency, incomplete bladder emptying.  She denies dysuria.  Has noticed hematuria.  No history of kidney stones   Review of Systems See HPI   Past Medical History:  Diagnosis Date  . Arthritis   . Complication of anesthesia   . Depression   . Hyperlipidemia   . Hypertension   . Insomnia   . Pneumonia   . PONV (postoperative nausea and vomiting)     Social History   Socioeconomic History  . Marital status: Married    Spouse name: Not on file  . Number of children: Not on file  . Years of education: Not on file  . Highest education level: Not on file  Occupational History  . Not on file  Tobacco Use  . Smoking status: Current Some Day Smoker    Last attempt to quit: 10/22/2002    Years since quitting: 17.8  . Smokeless tobacco: Never Used  Substance and Sexual Activity  . Alcohol use: No  . Drug use: Yes    Types: Marijuana  . Sexual activity: Not on file  Other Topics Concern  . 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    Married for 21 years   No children    Social Determinants of Health   Financial Resource Strain:   . Difficulty of Paying Living Expenses: Not on file  Food Insecurity:   . Worried About Charity fundraiser in the Last Year: Not on file  . Ran Out of Food in the Last Year: Not on file  Transportation Needs:   . Lack of Transportation (Medical): Not on file  . Lack of Transportation (Non-Medical): Not  on file  Physical Activity:   . Days of Exercise per Week: Not on file  . Minutes of Exercise per Session: Not on file  Stress:   . Feeling of Stress : Not on file  Social Connections:   . Frequency of Communication with Friends and Family: Not on file  . Frequency of Social Gatherings with Friends and Family: Not on file  . Attends Religious Services: Not on file  . Active Member of Clubs or Organizations: Not on file  . Attends Archivist Meetings: Not on file  . Marital Status: Not on file  Intimate Partner Violence:   . Fear of Current or Ex-Partner: Not on file  . Emotionally Abused: Not on file  . Physically Abused: Not on file  . Sexually Abused: Not on file    Past Surgical History:  Procedure Laterality Date  . MYRINGOTOMY     Right ear  . TONSILLECTOMY    . 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 Maureen Ralphs  . TOTAL KNEE ARTHROPLASTY Left 01/16/2016   Procedure: TOTAL LEFT KNEE ARTHROPLASTY;  Surgeon: Gaynelle Arabian, MD;  Location: [facility];  Service: Orthopedics;  Laterality: Left;    Family History  Problem Relation Age of Onset  . Arthritis Mother   .  Vision loss Mother   . Dementia Father   . Parkinsonism Father   . Alzheimer's disease Maternal Aunt     Allergies  Allergen Reactions  . Bee Venom Anaphylaxis  . Mosquito (Diagnostic) Hives and Swelling  . Blue Dyes (Parenteral) Other (See Comments)    By PCP request  . Other   . Amoxicillin-Pot Clavulanate Swelling    REACTION: swelling  Has patient had a PCN reaction causing immediate rash, facial/tongue/throat swelling, SOB or lightheadedness with hypotension: unk Has patient had a PCN reaction causing severe rash involving mucus membranes or skin necrosis: no Has patient had a PCN reaction that required hospitalization: unk Has patient had a PCN reaction occurring within the last 10 years: yes If all of the above answers are "NO", then may proceed with Cephalosporin use.   . Augmentin  [Amoxicillin-Pot Clavulanate] Itching and Swelling    Feet swell  . Oxycodone Hcl Nausea And Vomiting  . Vicodin [Hydrocodone-Acetaminophen] Anxiety    itching    Current Outpatient Medications on File Prior to Visit  Medication Sig Dispense Refill  . Cholecalciferol (VITAMIN D3 PO) Take 5,000 Units by mouth daily.    . citalopram (CELEXA) 20 MG tablet Take 1 tablet (20 mg total) by mouth 2 (two) times daily. 180 tablet 1  . diazepam (VALIUM) 2 MG tablet TAKE 1 TABLET BY MOUTH AT BEDTIME 30 tablet 2  . fluticasone (FLONASE) 50 MCG/ACT nasal spray Place 2 sprays into both nostrils daily as needed for allergies or rhinitis.     Marland Kitchen gabapentin (NEURONTIN) 100 MG capsule Take 2 capsules (200 mg total) by mouth at bedtime. 180 capsule 3  . hydrochlorothiazide (MICROZIDE) 12.5 MG capsule Take 1 capsule (12.5 mg total) by mouth daily. 90 capsule 3  . hydrOXYzine (ATARAX/VISTARIL) 10 MG tablet Take 1 tablet (10 mg total) by mouth 3 (three) times daily as needed. 90 tablet 3  . meclizine (ANTIVERT) 25 MG tablet Take 1 tablet (25 mg total) by mouth 2 (two) times daily. 60 tablet 0  . olopatadine (PATANOL) 0.1 % ophthalmic solution Place 1 drop into both eyes daily as needed for allergies.     Marland Kitchen ondansetron (ZOFRAN) 4 MG tablet Take 1 tablet (4 mg total) by mouth every 8 (eight) hours as needed for nausea or vomiting. 20 tablet 1  . simvastatin (ZOCOR) 20 MG tablet Take 1 tablet (20 mg total) by mouth daily. 90 tablet 3  . tiZANidine (ZANAFLEX) 4 MG tablet Take 1 tablet (4 mg total) by mouth every 8 (eight) hours as needed for up to 90 doses for muscle spasms. 90 tablet 1  . valACYclovir (VALTREX) 500 MG tablet TAKE 1 TABLET DAILY AS  NEEDED for fever blisters 90 tablet 0   No current facility-administered medications on file prior to visit.    BP 132/80 (BP Location: Left Arm, Patient Position: Sitting, Cuff Size: Normal)   Pulse (!) 56   Temp 98.8 F (37.1 C) (Oral)   Ht 5\' 3"  (1.6 m)   Wt 172 lb  12.8 oz (78.4 kg)   BMI 30.61 kg/m       Objective:   Physical Exam Vitals and nursing note reviewed.  Constitutional:      Appearance: Normal appearance.  Cardiovascular:     Rate and Rhythm: Normal rate and regular rhythm.     Pulses: Normal pulses.     Heart sounds: Normal heart sounds.  Pulmonary:     Effort: Pulmonary effort is normal.  Breath sounds: Normal breath sounds.  Abdominal:     Tenderness: There is abdominal tenderness in the suprapubic area. There is right CVA tenderness and left CVA tenderness.  Musculoskeletal:        General: Normal range of motion.  Skin:    General: Skin is warm and dry.  Neurological:     General: No focal deficit present.     Mental Status: She is alert and oriented to person, place, and time.  Psychiatric:        Mood and Affect: Mood normal.        Behavior: Behavior normal.        Thought Content: Thought content normal.        Judgment: Judgment normal.       Assessment & Plan:  1. Acute cystitis with hematuria  - POCT Urinalysis Dipstick (Automated) + leuks and blood.  -Treat with Cipro 500 mg twice daily x3 days for suspected UTI.  No recent urine culture.  Advised to stay well-hydrated.  Follow-up if symptoms do not improve. - Culture, Urine; Future - ciprofloxacin (CIPRO) 500 MG tablet; Take 1 tablet (500 mg total) by mouth 2 (two) times daily.  Dispense: 6 tablet; Refill: 0  Dorothyann Peng, NP

## 2020-08-24 LAB — URINE CULTURE
MICRO NUMBER:: 11148820
Result:: NO GROWTH
SPECIMEN QUALITY:: ADEQUATE

## 2020-09-07 ENCOUNTER — Ambulatory Visit: Payer: BC Managed Care – PPO | Admitting: Adult Health

## 2020-09-12 NOTE — Progress Notes (Deleted)
[place] Phone: [phone] Subjective:    I'm seeing this patient by the request  of:  Dorothyann Peng, NP  CC:   [id number]   07/12/2020 Patient given injection today and tolerated the procedure well.  Patient knows that this can be short-lived but I expect this to do relatively well.  On ultrasound today does seem to have decreasing amount of inflammation of the extensor carpi ulnaris so I would like patient to continue with pain.  Follow-up with me again 2 to 3 months.  Continue same medications for this chronic problem with exacerbation including the gabapentin.  Update 09/13/2020 Rose Rose is a 64 y.o. female coming in with complaint of right wrist, carpal tunnel. Patient states       Past Medical History:  Diagnosis Date  . Arthritis   . Complication of anesthesia   . Depression   . Hyperlipidemia   . Hypertension   . Insomnia   . Pneumonia   . PONV (postoperative nausea and vomiting)    Past Surgical History:  Procedure Laterality Date  . MYRINGOTOMY     Right ear  . TONSILLECTOMY    . 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 Maureen Ralphs  . TOTAL KNEE ARTHROPLASTY Left 01/16/2016   Procedure: TOTAL LEFT KNEE ARTHROPLASTY;  Surgeon: Gaynelle Arabian, MD;  Location: [facility];  Service: Orthopedics;  Laterality: Left;   Social History   Socioeconomic History  . Marital status: Married    Spouse name: Not on file  . Number of children: Not on file  . Years of education: Not on file  . Highest education level: Not on file  Occupational History  . Not on file  Tobacco Use  . Smoking status: Current Some Day Smoker    Last attempt to quit: 10/22/2002    Years since quitting: 17.9  . Smokeless tobacco: Never Used  Substance and Sexual Activity  . Alcohol use: No  . Drug use: Yes    Types: Marijuana  . Sexual activity: Not on file  Other Topics Concern  . 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    Married for 21 years   No children    Social Determinants of Health   Financial Resource Strain:   . Difficulty of Paying Living Expenses: Not on file  Food Insecurity:   . Worried About Charity fundraiser in the Last Year: Not on file  . Ran Out of Food in the Last Year: Not on file  Transportation Needs:   . Lack of Transportation (Medical): Not on file  . Lack of Transportation (Non-Medical): Not on file  Physical Activity:   . Days of Exercise per Week: Not on file  . Minutes of Exercise per Session: Not on file  Stress:   . Feeling of Stress : Not on file  Social Connections:   . Frequency of Communication with Friends and Family: Not on file  . Frequency of Social Gatherings with Friends and Family: Not on file  . Attends Religious Services: Not on file  . Active Member of Clubs or Organizations: Not on file  . Attends Archivist Meetings: Not on file  . Marital Status: Not on file   Allergies  Allergen Reactions  . Bee Venom Anaphylaxis  . Mosquito (Diagnostic) Hives and Swelling  . Blue Dyes (Parenteral) Other (See Comments)  By PCP request  . Other   . Amoxicillin-Pot Clavulanate Swelling    REACTION: swelling  Has patient had a PCN reaction causing immediate rash, facial/tongue/throat swelling, SOB or lightheadedness with hypotension: unk Has patient had a PCN reaction causing severe rash involving mucus membranes or skin necrosis: no Has patient had a PCN reaction that required hospitalization: unk Has patient had a PCN reaction occurring within the last 10 years: yes If all of the above answers are "NO", then may proceed with Cephalosporin use.   . Augmentin [Amoxicillin-Pot Clavulanate] Itching and Swelling    Feet swell  . Oxycodone Hcl Nausea And Vomiting  . Vicodin [Hydrocodone-Acetaminophen] Anxiety    itching   Family History  Problem Relation Age of Onset  .  Arthritis Mother   . Vision loss Mother   . Dementia Father   . Parkinsonism Father   . Alzheimer's disease Maternal Aunt      Current Outpatient Medications (Cardiovascular):  .  hydrochlorothiazide (MICROZIDE) 12.5 MG capsule, Take 1 capsule (12.5 mg total) by mouth daily. .  simvastatin (ZOCOR) 20 MG tablet, Take 1 tablet (20 mg total) by mouth daily.  Current Outpatient Medications (Respiratory):  .  fluticasone (FLONASE) 50 MCG/ACT nasal spray, Place 2 sprays into both nostrils daily as needed for allergies or rhinitis.     Current Outpatient Medications (Other):  Marland Kitchen  Cholecalciferol (VITAMIN D3 PO), Take 5,000 Units by mouth daily. .  ciprofloxacin (CIPRO) 500 MG tablet, Take 1 tablet (500 mg total) by mouth 2 (two) times daily. .  citalopram (CELEXA) 20 MG tablet, Take 1 tablet (20 mg total) by mouth 2 (two) times daily. .  diazepam (VALIUM) 2 MG tablet, TAKE 1 TABLET BY MOUTH AT BEDTIME .  gabapentin (NEURONTIN) 100 MG capsule, Take 2 capsules (200 mg total) by mouth at bedtime. .  hydrOXYzine (ATARAX/VISTARIL) 10 MG tablet, Take 1 tablet (10 mg total) by mouth 3 (three) times daily as needed. .  meclizine (ANTIVERT) 25 MG tablet, Take 1 tablet (25 mg total) by mouth 2 (two) times daily. Marland Kitchen  olopatadine (PATANOL) 0.1 % ophthalmic solution, Place 1 drop into both eyes daily as needed for allergies.  Marland Kitchen  ondansetron (ZOFRAN) 4 MG tablet, Take 1 tablet (4 mg total) by mouth every 8 (eight) hours as needed for nausea or vomiting. Marland Kitchen  tiZANidine (ZANAFLEX) 4 MG tablet, Take 1 tablet (4 mg total) by mouth every 8 (eight) hours as needed for up to 90 doses for muscle spasms. .  valACYclovir (VALTREX) 500 MG tablet, TAKE 1 TABLET DAILY AS  NEEDED for fever blisters   Reviewed prior external information including notes and imaging from  primary care provider As well as notes that were available from care everywhere and other healthcare systems.  Past medical history, social, surgical  and family history all reviewed in electronic medical record.  No pertanent information unless stated regarding to the chief complaint.   Review of Systems:  No headache, visual changes, nausea, vomiting, diarrhea, constipation, dizziness, abdominal pain, skin rash, fevers, chills, night sweats, weight loss, swollen lymph nodes, body aches, joint swelling, chest pain, shortness of breath, mood changes. POSITIVE muscle aches  Objective  There were no vitals taken for this visit.   General: No apparent distress alert and oriented x3 mood and affect normal, dressed appropriately.  HEENT: Pupils equal, extraocular movements intact  Respiratory: Patient's speak in full sentences and does not appear short of breath  Cardiovascular: No lower extremity edema, non tender,  no erythema  Neuro: Cranial nerves II through XII are intact, neurovascularly intact in all extremities with 2+ DTRs and 2+ pulses.  Gait normal with good balance and coordination.  MSK:  Non tender with full range of motion and good stability and symmetric strength and tone of shoulders, elbows, wrist, hip, knee and ankles bilaterally.     Impression and Recommendations:     The above documentation has been reviewed and is accurate and complete Jacqualin Combes

## 2020-09-13 ENCOUNTER — Ambulatory Visit: Payer: BC Managed Care – PPO | Admitting: Family Medicine

## 2020-09-16 ENCOUNTER — Other Ambulatory Visit: Payer: Self-pay | Admitting: Adult Health

## 2020-09-16 DIAGNOSIS — E782 Mixed hyperlipidemia: Secondary | ICD-10-CM

## 2020-09-25 ENCOUNTER — Other Ambulatory Visit: Payer: Self-pay | Admitting: Adult Health

## 2020-09-25 DIAGNOSIS — I10 Essential (primary) hypertension: Secondary | ICD-10-CM

## 2020-09-27 ENCOUNTER — Other Ambulatory Visit: Payer: Self-pay

## 2020-09-27 ENCOUNTER — Ambulatory Visit: Payer: BC Managed Care – PPO | Admitting: Adult Health

## 2020-09-27 ENCOUNTER — Encounter: Payer: Self-pay | Admitting: Adult Health

## 2020-09-27 DIAGNOSIS — B009 Herpesviral infection, unspecified: Secondary | ICD-10-CM | POA: Diagnosis not present

## 2020-09-27 MED ORDER — VALACYCLOVIR HCL 500 MG PO TABS: ORAL_TABLET | ORAL | 1 refills | Status: AC

## 2020-09-27 NOTE — Progress Notes (Signed)
Subjective:    Patient ID: Rose Rose, female    DOB: [birth year], 64 y.o.   MRN: [id number]  HPI  64 year old female who presents to the office today to have paperwork filled out for her health insurance.  She has no acute issues today but also needs a refill of her Valtrex.  Review of Systems See HPI   Past Medical History:  Diagnosis Date  . Arthritis   . Complication of anesthesia   . Depression   . Hyperlipidemia   . Hypertension   . Insomnia   . Pneumonia   . PONV (postoperative nausea and vomiting)     Social History   Socioeconomic History  . Marital status: Married    Spouse name: Not on file  . Number of children: Not on file  . Years of education: Not on file  . Highest education level: Not on file  Occupational History  . Not on file  Tobacco Use  . Smoking status: Current Some Day Smoker    Last attempt to quit: 10/22/2002    Years since quitting: 17.9  . Smokeless tobacco: Never Used  Substance and Sexual Activity  . Alcohol use: No  . Drug use: Yes    Types: Marijuana  . Sexual activity: Not on file  Other Topics Concern  . 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    Married for 21 years   No children    Social Determinants of Health   Financial Resource Strain:   . Difficulty of Paying Living Expenses: Not on file  Food Insecurity:   . Worried About Charity fundraiser in the Last Year: Not on file  . Ran Out of Food in the Last Year: Not on file  Transportation Needs:   . Lack of Transportation (Medical): Not on file  . Lack of Transportation (Non-Medical): Not on file  Physical Activity:   . Days of Exercise per Week: Not on file  . Minutes of Exercise per Session: Not on file  Stress:   . Feeling of Stress : Not on file  Social Connections:   . Frequency of Communication with Friends and Family: Not on file  . Frequency of Social Gatherings with Friends and Family: Not on file    . Attends Religious Services: Not on file  . Active Member of Clubs or Organizations: Not on file  . Attends Archivist Meetings: Not on file  . Marital Status: Not on file  Intimate Partner Violence:   . Fear of Current or Ex-Partner: Not on file  . Emotionally Abused: Not on file  . Physically Abused: Not on file  . Sexually Abused: Not on file    Past Surgical History:  Procedure Laterality Date  . MYRINGOTOMY     Right ear  . TONSILLECTOMY    . 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 Maureen Ralphs  . TOTAL KNEE ARTHROPLASTY Left 01/16/2016   Procedure: TOTAL LEFT KNEE ARTHROPLASTY;  Surgeon: Gaynelle Arabian, MD;  Location: [facility];  Service: Orthopedics;  Laterality: Left;    Family History  Problem Relation Age of Onset  . Arthritis Mother   . Vision loss Mother   . Dementia Father   . Parkinsonism Father   . Alzheimer's disease Maternal Aunt     Allergies  Allergen Reactions  . Bee Venom Anaphylaxis  . Mosquito (Diagnostic) Hives and Swelling  . Blue Dyes (Parenteral)  Other (See Comments)    By PCP request  . Other   . Amoxicillin-Pot Clavulanate Swelling    REACTION: swelling  Has patient had a PCN reaction causing immediate rash, facial/tongue/throat swelling, SOB or lightheadedness with hypotension: unk Has patient had a PCN reaction causing severe rash involving mucus membranes or skin necrosis: no Has patient had a PCN reaction that required hospitalization: unk Has patient had a PCN reaction occurring within the last 10 years: yes If all of the above answers are "NO", then may proceed with Cephalosporin use.   . Augmentin [Amoxicillin-Pot Clavulanate] Itching and Swelling    Feet swell  . Oxycodone Hcl Nausea And Vomiting  . Vicodin [Hydrocodone-Acetaminophen] Anxiety    itching    Current Outpatient Medications on File Prior to Visit  Medication Sig Dispense Refill  . Cholecalciferol (VITAMIN D3 PO) Take 5,000 Units by mouth daily.    .  citalopram (CELEXA) 20 MG tablet Take 1 tablet (20 mg total) by mouth 2 (two) times daily. 180 tablet 1  . diazepam (VALIUM) 2 MG tablet TAKE 1 TABLET BY MOUTH AT BEDTIME 30 tablet 2  . fluticasone (FLONASE) 50 MCG/ACT nasal spray Place 2 sprays into both nostrils daily as needed for allergies or rhinitis.     Marland Kitchen gabapentin (NEURONTIN) 100 MG capsule Take 2 capsules (200 mg total) by mouth at bedtime. 180 capsule 3  . hydrochlorothiazide (MICROZIDE) 12.5 MG capsule Take 1 capsule (12.5 mg total) by mouth daily. 90 capsule 3  . hydrOXYzine (ATARAX/VISTARIL) 10 MG tablet Take 1 tablet (10 mg total) by mouth 3 (three) times daily as needed. 90 tablet 3  . meclizine (ANTIVERT) 25 MG tablet Take 1 tablet (25 mg total) by mouth 2 (two) times daily. 60 tablet 0  . olopatadine (PATANOL) 0.1 % ophthalmic solution Place 1 drop into both eyes daily as needed for allergies.     Marland Kitchen ondansetron (ZOFRAN) 4 MG tablet Take 1 tablet (4 mg total) by mouth every 8 (eight) hours as needed for nausea or vomiting. 20 tablet 1  . simvastatin (ZOCOR) 20 MG tablet TAKE 1 TABLET DAILY 90 tablet 3  . tiZANidine (ZANAFLEX) 4 MG tablet Take 1 tablet (4 mg total) by mouth every 8 (eight) hours as needed for up to 90 doses for muscle spasms. 90 tablet 1  . valACYclovir (VALTREX) 500 MG tablet TAKE 1 TABLET DAILY AS  NEEDED for fever blisters 90 tablet 0   No current facility-administered medications on file prior to visit.    BP 118/80 (BP Location: Left Arm, Patient Position: Sitting, Cuff Size: Normal)   Pulse 72   Temp 98.9 F (37.2 C) (Oral)   Ht 5' 2.25" (1.581 m)   Wt 172 lb 9.6 oz (78.3 kg)   BMI 31.32 kg/m       Objective:   Physical Exam Vitals and nursing note reviewed.  Constitutional:      General: She is not in acute distress.    Appearance: Normal appearance. She is well-developed. She is not ill-appearing.  HENT:     Head: Normocephalic and atraumatic.     Nose: Rhinorrhea present.     Mouth/Throat:      Mouth: Mucous membranes are moist.     Pharynx: Oropharynx is clear. No oropharyngeal exudate or posterior oropharyngeal erythema.  Eyes:     General:        Right eye: No discharge.        Left eye: No discharge.  Extraocular Movements: Extraocular movements intact.     Conjunctiva/sclera: Conjunctivae normal.     Pupils: Pupils are equal, round, and reactive to light.  Neck:     Thyroid: No thyromegaly.     Vascular: No carotid bruit.     Trachea: No tracheal deviation.  Cardiovascular:     Rate and Rhythm: Normal rate and regular rhythm.     Pulses: Normal pulses.     Heart sounds: Normal heart sounds. No murmur heard.  No friction rub. No gallop.   Pulmonary:     Effort: Pulmonary effort is normal. No respiratory distress.     Breath sounds: Normal breath sounds. No stridor. No wheezing, rhonchi or rales.  Chest:     Chest wall: No tenderness.  Abdominal:     General: Abdomen is flat. Bowel sounds are normal. There is no distension.     Palpations: Abdomen is soft. There is no mass.     Tenderness: There is no abdominal tenderness. There is no right CVA tenderness, left CVA tenderness, guarding or rebound.     Hernia: No hernia is present.  Musculoskeletal:        General: No swelling, tenderness, deformity or signs of injury. Normal range of motion.     Cervical back: Normal range of motion and neck supple.     Right lower leg: No edema.     Left lower leg: No edema.  Lymphadenopathy:     Cervical: No cervical adenopathy.  Skin:    General: Skin is warm and dry.     Capillary Refill: Capillary refill takes less than 2 seconds.     Coloration: Skin is not jaundiced or pale.     Findings: No bruising, erythema, lesion or rash.  Neurological:     General: No focal deficit present.     Mental Status: She is alert and oriented to person, place, and time.     Cranial Nerves: No cranial nerve deficit.     Sensory: No sensory deficit.     Motor: No weakness.      Coordination: Coordination normal.     Gait: Gait normal.     Deep Tendon Reflexes: Reflexes normal.  Psychiatric:        Mood and Affect: Mood normal.        Behavior: Behavior normal.        Thought Content: Thought content normal.        Judgment: Judgment normal.       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  Form will be faxed in.  Dorothyann Peng, NP

## 2020-10-10 ENCOUNTER — Other Ambulatory Visit: Payer: Self-pay | Admitting: Adult Health

## 2020-11-02 ENCOUNTER — Other Ambulatory Visit: Payer: Self-pay | Admitting: Adult Health

## 2020-11-02 DIAGNOSIS — F4322 Adjustment disorder with anxiety: Secondary | ICD-10-CM

## 2020-12-13 ENCOUNTER — Other Ambulatory Visit (HOSPITAL_COMMUNITY)
Admission: RE | Admit: 2020-12-13 | Discharge: 2020-12-13 | Disposition: A | Discharge status: Home / Self Care | Payer: BC Managed Care – PPO | Source: Ambulatory Visit | Attending: Adult Health | Admitting: Adult Health

## 2020-12-13 ENCOUNTER — Other Ambulatory Visit: Payer: Self-pay

## 2020-12-13 ENCOUNTER — Encounter: Payer: Self-pay | Admitting: Adult Health

## 2020-12-13 ENCOUNTER — Ambulatory Visit (INDEPENDENT_AMBULATORY_CARE_PROVIDER_SITE_OTHER): Payer: BC Managed Care – PPO | Admitting: Adult Health

## 2020-12-13 VITALS — BP 120/80 | Temp 98.0°F | Ht 62.0 in | Wt 176.0 lb

## 2020-12-13 DIAGNOSIS — F419 Anxiety disorder, unspecified: Secondary | ICD-10-CM | POA: Diagnosis not present

## 2020-12-13 DIAGNOSIS — B009 Herpesviral infection, unspecified: Secondary | ICD-10-CM | POA: Diagnosis not present

## 2020-12-13 DIAGNOSIS — E782 Mixed hyperlipidemia: Secondary | ICD-10-CM | POA: Diagnosis not present

## 2020-12-13 DIAGNOSIS — Z0000 Encounter for general adult medical examination without abnormal findings: Secondary | ICD-10-CM | POA: Diagnosis not present

## 2020-12-13 DIAGNOSIS — I10 Essential (primary) hypertension: Secondary | ICD-10-CM

## 2020-12-13 DIAGNOSIS — Z720 Tobacco use: Secondary | ICD-10-CM

## 2020-12-13 DIAGNOSIS — Z1211 Encounter for screening for malignant neoplasm of colon: Secondary | ICD-10-CM

## 2020-12-13 DIAGNOSIS — F32A Depression, unspecified: Secondary | ICD-10-CM

## 2020-12-13 LAB — COMPREHENSIVE METABOLIC PANEL
ALT: 18 U/L (ref 0–35)
AST: 22 U/L (ref 0–37)
Albumin: 4.1 g/dL (ref 3.5–5.2)
Alkaline Phosphatase: 68 U/L (ref 39–117)
BUN: 16 mg/dL (ref 6–23)
CO2: 27 mEq/L (ref 19–32)
Calcium: 9.4 mg/dL (ref 8.4–10.5)
Chloride: 104 mEq/L (ref 96–112)
Creatinine, Ser: 0.73 mg/dL (ref 0.40–1.20)
GFR: 87.52 mL/min (ref >60.00)
Glucose, Bld: 99 mg/dL (ref 70–99)
Potassium: 4 mEq/L (ref 3.5–5.1)
Sodium: 139 mEq/L (ref 135–145)
Total Bilirubin: 0.4 mg/dL (ref 0.2–1.2)
Total Protein: 6.9 g/dL (ref 6.0–8.3)

## 2020-12-13 LAB — CBC WITH DIFFERENTIAL/PLATELET
Basophils Absolute: 0.1 10*3/uL (ref 0.0–0.1)
Basophils Relative: 1.1 % (ref 0.0–3.0)
Eosinophils Absolute: 0.1 10*3/uL (ref 0.0–0.7)
Eosinophils Relative: 2 % (ref 0.0–5.0)
HCT: 43 % (ref 36.0–46.0)
Hemoglobin: 14.5 g/dL (ref 12.0–15.0)
Lymphocytes Relative: 32.5 % (ref 12.0–46.0)
Lymphs Abs: 2 10*3/uL (ref 0.7–4.0)
MCHC: 33.7 g/dL (ref 30.0–36.0)
MCV: 90.7 fl (ref 78.0–100.0)
Monocytes Absolute: 0.4 10*3/uL (ref 0.1–1.0)
Monocytes Relative: 6.4 % (ref 3.0–12.0)
Neutro Abs: 3.6 10*3/uL (ref 1.4–7.7)
Neutrophils Relative %: 58 % (ref 43.0–77.0)
Platelets: 211 10*3/uL (ref 150.0–400.0)
RBC: 4.74 Mil/uL (ref 3.87–5.11)
RDW: 13.9 % (ref 11.5–15.5)
WBC: 6.2 10*3/uL (ref 4.0–10.5)

## 2020-12-13 LAB — LIPID PANEL
Cholesterol: 153 mg/dL (ref 0–200)
HDL: 64.6 mg/dL (ref >39.00)
LDL Cholesterol: 73 mg/dL (ref 0–99)
NonHDL: 88.36
Total CHOL/HDL Ratio: 2
Triglycerides: 76 mg/dL (ref 0.0–149.0)
VLDL: 15.2 mg/dL (ref 0.0–40.0)

## 2020-12-13 LAB — TSH: TSH: 2.88 u[IU]/mL (ref 0.35–4.50)

## 2020-12-13 NOTE — Progress Notes (Signed)
Subjective:    Patient ID: Rose Rose, female    DOB: Feb 25, 1956, 65 y.o.   MRN: [id number]  HPI Patient presents for yearly preventative medicine examination. She is a pleasant 65 year old female who  has a past medical history of Arthritis, Complication of anesthesia, Depression, Hyperlipidemia, Hypertension, Insomnia, Pneumonia, and PONV (postoperative nausea and vomiting).  Hyperlipidemia -prescribed Zocor 20 mg daily.  She denies myalgia or fatigue Lab Results  Component Value Date   CHOL 152 12/02/2019   HDL 58.40 12/02/2019   LDLCALC 78 12/02/2019   LDLDIRECT 99.0 07/01/2015   TRIG 80.0 12/02/2019   CHOLHDL 3 12/02/2019   Anxiety and Depression -currently prescribed Celexa 20 mg daily and Valium 2 mg nightly.   Essential Hypertension -takes hydrochlorothiazide 12.5 mg.  She denies dizziness, lightheadedness, chest pain, or shortness of breath. BP Readings from Last 3 Encounters:  12/13/20 120/80  09/27/20 118/80  08/23/20 132/80   HSV 1 - uses Valtrex PRN   Tobacco Use - she continues to smoke cigarettes. Understands her risks but does not want to quit right now.   All immunizations and health maintenance protocols were reviewed with the patient and needed orders were placed.  Appropriate screening laboratory values were ordered for the patient including screening of hyperlipidemia, renal function and hepatic function.  Medication reconciliation,  past medical history, social history, problem list and allergies were reviewed in detail with the patient  Goals were established with regard to weight loss, exercise, and  diet in compliance with medications  She is overdue for routine screening colonoscopy.  Is due for GYN exam ( will have today) Is up to date on mammograms    Review of Systems  Constitutional: Negative.   HENT: Negative.   Eyes: Negative.   Respiratory: Negative.   Cardiovascular: Negative.   Gastrointestinal: Negative.   Endocrine: Negative.    Genitourinary: Negative.   Musculoskeletal: Negative.   Skin: Negative.   Allergic/Immunologic: Negative.   Neurological: Negative.   Hematological: Negative.   Psychiatric/Behavioral: Negative.    Past Medical History:  Diagnosis Date  . Arthritis   . Complication of anesthesia   . Depression   . Hyperlipidemia   . Hypertension   . Insomnia   . Pneumonia   . PONV (postoperative nausea and vomiting)     Social History   Socioeconomic History  . Marital status: Married    Spouse name: Not on file  . Number of children: Not on file  . Years of education: Not on file  . Highest education level: Not on file  Occupational History  . Not on file  Tobacco Use  . Smoking status: Current Some Day Smoker    Last attempt to quit: 10/22/2002    Years since quitting: 18.1  . Smokeless tobacco: Never Used  Substance and Sexual Activity  . Alcohol use: No  . Drug use: Yes    Types: Marijuana  . Sexual activity: Not on file  Other Topics Concern  . 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    Married for 21 years   No children    Social Determinants of Radio broadcast assistant Strain: Not on file  Food Insecurity: Not on file  Transportation Needs: Not on file  Physical Activity: Not on file  Stress: Not on file  Social Connections: Not on file  Intimate Partner Violence: Not on file    Past  Surgical History:  Procedure Laterality Date  . MYRINGOTOMY     Right ear  . TONSILLECTOMY    . 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 Maureen Ralphs  . TOTAL KNEE ARTHROPLASTY Left 01/16/2016   Procedure: TOTAL LEFT KNEE ARTHROPLASTY;  Surgeon: Gaynelle Arabian, MD;  Location: [facility];  Service: Orthopedics;  Laterality: Left;    Family History  Problem Relation Age of Onset  . Arthritis Mother   . Vision loss Mother   . Dementia Father   . Parkinsonism Father   . Alzheimer's disease Maternal Aunt     Allergies   Allergen Reactions  . Bee Venom Anaphylaxis  . Mosquito (Diagnostic) Hives and Swelling  . Blue Dyes (Parenteral) Other (See Comments)    By PCP request  . Other   . Amoxicillin-Pot Clavulanate Swelling    REACTION: swelling  Has patient had a PCN reaction causing immediate rash, facial/tongue/throat swelling, SOB or lightheadedness with hypotension: unk Has patient had a PCN reaction causing severe rash involving mucus membranes or skin necrosis: no Has patient had a PCN reaction that required hospitalization: unk Has patient had a PCN reaction occurring within the last 10 years: yes If all of the above answers are "NO", then may proceed with Cephalosporin use.   . Augmentin [Amoxicillin-Pot Clavulanate] Itching and Swelling    Feet swell  . Oxycodone Hcl Nausea And Vomiting  . Vicodin [Hydrocodone-Acetaminophen] Anxiety    itching    Current Outpatient Medications on File Prior to Visit  Medication Sig Dispense Refill  . Cholecalciferol (VITAMIN D3 PO) Take 5,000 Units by mouth daily.    . citalopram (CELEXA) 20 MG tablet Take 1 tablet (20 mg total) by mouth 2 (two) times daily. 180 tablet 1  . diazepam (VALIUM) 2 MG tablet TAKE 1 TABLET BY MOUTH AT BEDTIME 30 tablet 1  . fluticasone (FLONASE) 50 MCG/ACT nasal spray Place 2 sprays into both nostrils daily as needed for allergies or rhinitis.     . hydrochlorothiazide (MICROZIDE) 12.5 MG capsule TAKE 1 CAPSULE DAILY 90 capsule 1  . hydrOXYzine (ATARAX/VISTARIL) 10 MG tablet Take 1 tablet (10 mg total) by mouth 3 (three) times daily as needed. 90 tablet 3  . meclizine (ANTIVERT) 25 MG tablet Take 1 tablet by mouth twice daily 30 tablet 0  . olopatadine (PATANOL) 0.1 % ophthalmic solution Place 1 drop into both eyes daily as needed for allergies.     Marland Kitchen ondansetron (ZOFRAN) 4 MG tablet Take 1 tablet (4 mg total) by mouth every 8 (eight) hours as needed for nausea or vomiting. 20 tablet 1  . simvastatin (ZOCOR) 20 MG tablet TAKE 1  TABLET DAILY 90 tablet 3  . tiZANidine (ZANAFLEX) 4 MG tablet Take 1 tablet (4 mg total) by mouth every 8 (eight) hours as needed for up to 90 doses for muscle spasms. 90 tablet 1  . valACYclovir (VALTREX) 500 MG tablet TAKE 1 TABLET DAILY AS  NEEDED for fever blisters 90 tablet 1   No current facility-administered medications on file prior to visit.    BP 120/80   Temp 98 F (36.7 C)   Ht 5\' 2"  (1.575 m) Comment: WITHOUT SHOES  Wt 176 lb (79.8 kg)   BMI 32.19 kg/m       Objective:   Physical Exam Vitals and nursing note reviewed. Exam conducted with a chaperone present.  Constitutional:      General: She is not in acute distress.    Appearance: Normal appearance. She  is well-developed and overweight. She is not ill-appearing.  HENT:     Head: Normocephalic and atraumatic.     Right Ear: Tympanic membrane, ear canal and external ear normal. There is no impacted cerumen.     Left Ear: Tympanic membrane, ear canal and external ear normal. There is no impacted cerumen.     Nose: Nose normal. No congestion or rhinorrhea.     Mouth/Throat:     Mouth: Mucous membranes are moist.     Pharynx: Oropharynx is clear. No oropharyngeal exudate or posterior oropharyngeal erythema.  Eyes:     General:        Right eye: No discharge.        Left eye: No discharge.     Extraocular Movements: Extraocular movements intact.     Conjunctiva/sclera: Conjunctivae normal.     Pupils: Pupils are equal, round, and reactive to light.  Neck:     Thyroid: No thyromegaly.     Vascular: No carotid bruit.     Trachea: No tracheal deviation.  Cardiovascular:     Rate and Rhythm: Normal rate and regular rhythm.     Pulses: Normal pulses.     Heart sounds: Normal heart sounds. No murmur heard. No friction rub. No gallop.   Pulmonary:     Effort: Pulmonary effort is normal. No respiratory distress.     Breath sounds: Normal breath sounds. No stridor. No wheezing, rhonchi or rales.  Chest:     Chest  wall: No tenderness.  Abdominal:     General: Abdomen is flat. Bowel sounds are normal. There is no distension.     Palpations: Abdomen is soft. There is no mass.     Tenderness: There is no abdominal tenderness. There is no right CVA tenderness, left CVA tenderness, guarding or rebound.     Hernia: No hernia is present. There is no hernia in the left inguinal area or right inguinal area.  Genitourinary:    General: Normal vulva.     Vagina: Normal.     Cervix: Normal.     Uterus: Normal.      Adnexa: Right adnexa normal and left adnexa normal.  Musculoskeletal:        General: No swelling, tenderness, deformity or signs of injury. Normal range of motion.     Cervical back: Normal range of motion and neck supple.     Right lower leg: No edema.     Left lower leg: No edema.  Lymphadenopathy:     Cervical: No cervical adenopathy.     Lower Body: No right inguinal adenopathy. No left inguinal adenopathy.  Skin:    General: Skin is warm and dry.     Capillary Refill: Capillary refill takes less than 2 seconds.     Coloration: Skin is not jaundiced or pale.     Findings: No bruising, erythema, lesion or rash.  Neurological:     General: No focal deficit present.     Mental Status: She is alert and oriented to person, place, and time.     Cranial Nerves: No cranial nerve deficit.     Sensory: No sensory deficit.     Motor: No weakness.     Coordination: Coordination normal.     Gait: Gait normal.     Deep Tendon Reflexes: Reflexes normal.  Psychiatric:        Mood and Affect: Mood normal.        Behavior: Behavior normal.  Thought Content: Thought content normal.        Judgment: Judgment normal.       Assessment & Plan:  1. Routine general medical examination at a health care facility - Needs to quit smoking  - Encouraged heart healthy diet and exercise  - Follow up in 1 year or sooner if needed - TSH; Future - Lipid panel; Future - Comprehensive metabolic panel;  Future - CBC with Differential/Platelet; Future - CBC with Differential/Platelet - Comprehensive metabolic panel - Lipid panel - TSH - PAP [Petros]  2. Essential hypertension - Well controlled.  - No change in medication - TSH; Future - Lipid panel; Future - Comprehensive metabolic panel; Future - CBC with Differential/Platelet; Future - CBC with Differential/Platelet - Comprehensive metabolic panel - Lipid panel - TSH  3. Mixed hyperlipidemia - Consider increase in statin  - TSH; Future - Lipid panel; Future - Comprehensive metabolic panel; Future - CBC with Differential/Platelet; Future - CBC with Differential/Platelet - Comprehensive metabolic panel - Lipid panel - TSH  4. Herpes simplex virus (HSV) infection - Continue with Valtrex PRN   5. Colon cancer screening - Refuses colonoscopy. Will do cologuard   6. Anxiety and depression - Controlled with current regimen. No changes   7. Tobacco use - Encouraged to quit    Dorothyann Peng, NP

## 2020-12-15 LAB — CYTOLOGY - PAP
Adequacy: ABSENT
Comment: NEGATIVE
Diagnosis: NEGATIVE
High risk HPV: NEGATIVE

## 2021-02-12 ENCOUNTER — Other Ambulatory Visit: Payer: Self-pay | Admitting: Family Medicine

## 2021-02-18 ENCOUNTER — Other Ambulatory Visit: Payer: Self-pay | Admitting: Adult Health

## 2021-02-18 DIAGNOSIS — I10 Essential (primary) hypertension: Secondary | ICD-10-CM

## 2021-06-29 NOTE — Progress Notes (Signed)
 Telehealth Visit  Assessment & Plan   (Z76.0) Medication refill  (I10) Primary hypertension  (F41.9) Anxiety  (E78.5) Hyperlipidemia, unspecified hyperlipidemia type  -No acute concerns at this time -Blood pressure, anxiety well controlled -Bridge refills provided until new PCP appointment that is scheduled for 10/18/2021  Rose Rose made aware of inherent limitations of telehealth care and the need for in-person examination for certain conditions/complaints. Patient voiced understanding and agreed to the treatments and recommendations discussed during this virtual visit.  New Problem(s), no additional work-up, prescription drug management (Moderate 99214/99203)   Medication Orders this Encounter  Medications  . hydroCHLOROthiazide  (HYDRODIURIL ) 12.5 mg PO CAPS    Sig: Take 1 Cap by Mouth Once a Day.    Dispense:  90 Cap    Refill:  0  . simvastatin  (ZOCOR ) 20 mg PO TABS    Sig: Take 1 Tab by Mouth Every Night at Bedtime.    Dispense:  90 Tab    Refill:  0  . citalopram  (CELEXA ) 20 mg PO TABS    Sig: Take 1 Tab by Mouth Once a Day.    Dispense:  90 Tab    Refill:  0  . hydrOXYzine  (ATARAX ) 10 mg PO TABS    Sig: Take 1 Tab by Mouth Every 8 Hours As Needed.    Dispense:  90 Tab    Refill:  0  No orders of the defined types were placed in this encounter.   Subjective   CC/HPI: Rose Rose is a 65 y.o.female that has initiated a virtual care visit for MEDICATION  REFILL  New patient.  Presents for refills of maintenance medications.  No acute concerns or complaints today  Celexa  20mg . Has been on consistently for past 3 years. Takes for anxiety.  Reports anxiety is well controlled with medication.  Also takes hydroxyzine  10 mg as needed.  Typically uses once daily.  Simvastatin  20mg  for hyperlipidemia.  No recent dose change.  Has been taking for at least 5 years.  Denies myalgias, muscle cramps.  Reports lipid panel has improved on medication  HCTZ 12.5 for  hypertension.  No recent dose changes.  Blood pressure is well controlled.  Patient denies headache, chest pain, swelling of lower extremities.  Review of Symptoms:   Per HPI  Social History:  has no history on file for tobacco use, alcohol use, and drug use.  Family History: family history is not on file.  No past surgical history on file.Medication list has been reviewed in full.   Objective   BP 118/76   Pulse 68   Temp (!) 48.2 F (9 C)   Ht 5' 3 (1.6 m)   Wt 79.4 kg (175 lb)   BMI 31.00 kg/m (Vital signs not provided by patient if none listed.) No new labs to review.   Physical Exam Pulmonary:     Effort: Pulmonary effort is normal. No respiratory distress.  Neurological:     Mental Status: She is alert and oriented to person, place, and time.  Psychiatric:        Mood and Affect: Mood and affect normal.        Behavior: Behavior normal.        Thought Content: Thought content normal.        Judgment: Judgment normal.  Note: Objective information such as vital signs and physical exam findings are observed or patient obtained.  Signed, Jama GORMAN Peacock, NP [facility] VIRTUAL CARE SERVICES Dept Tel.# [phone] 06/29/2021  Telehealth Documentation Originating Site (Patient Location) Home.  City: [address] [place]  [place] [address]  State: [place] Distant [place] [employer] forces training and education officer) Office Location:  Publishing copy Care Office  [place], [place]      Consent for Treatment: This visit was conducted with the use of an interactive audio and/or video telecommunications system that permits real-time communication between the patient and this provider. The patient has submitted their consent to be treated electronically by way of this video technology.  The risks and limitations of the process of telemedicine have been conveyed through either written and/or verbal consent for treatment and have been reiterated during this encounter.   Note: Portions of the  document were created using Dragon dictation software and may contain computer transcription errors and phonetic errors. Other human proofreading errors may also exist. Corrections may be performed at a later time. Please contact us  for any clarification if needed.

## 2023-07-31 LAB — COLOGUARD: COLOGUARD: NEGATIVE

## 2024-03-31 NOTE — Progress Notes (Signed)
 Rose Rose completed STEADI falls risk screening  Have you fallen in the past year?: (!) (Patient-Rptd) Yes Have you fallen more than once in the past year?: (Patient-Rptd) No Did any of your falls result in an injury?: (Patient-Rptd) No Do you take any medications that make you feel dizzy or unsteady on your feet?: (Patient-Rptd) No Do you feel unsteady when standing or walking?: (!) (Patient-Rptd) Yes Are you worried about falling?: (Patient-Rptd) No  Office Visit - Service Date: 03/31/24 Assessment & Plan  1. Medicare annual wellness visit, subsequent  2. Gait difficulty Rose Rose has been screened as a fall risk.  Information was provided in the After Visit Summary, including fall prevention tips and a link to the CDC STEADI site.  3. Mixed hyperlipidemia - Taking Zocor    4. Primary hypertension - BP at goal  - No changes   5. Hx of cold sores - valACYclovir  (VALTREX ) 500 mg PO TABS; Take 1 tab by mouth twice daily for up to 7 days at first sign of cold sore  Indications: a cold sore  Dispense: 90 Tab; Refill: 0  6. Routine general medical examination Reviewed and updated preventative screenings as appropriate  Labs reviewed as available  The patient's medicines, past medical, family and social histories (including tobacco usage) were reviewed and updated as appropriate.   Assessment & Plan 1. Medicare wellness visit. - Laboratory results are within normal limits, except for a slightly elevated TSH level, which does not necessitate intervention at this time. - Increased pain and limited mobility due to rheumatoid arthritis. - Advised to monitor weight and incorporate chair exercises into her routine. - Follow-up lab test to be conducted in 6 months.  2. Fever blisters. - Reports an increase in fever blisters over the last few months, likely triggered by stress. - Increased pain and limited mobility. - Discussed potential triggers and stress management. -  Prescription for Valtrex  provided.  3. Rheumatoid arthritis. - Continues to take methotrexate. - Reports exacerbation of symptoms while working in the yard. - Review of records and discussion about upcoming rheumatology visit in 04/2024. - Follow-up with rheumatology scheduled for 04/2024.  4. Fatigue. - Fatigue could be attributed to current medications, gabapentin  and hydroxyzine , or overall sadness. - Normal laboratory results, including blood counts, kidney function, and cholesterol. - Discussed potential increase in citalopram  dosage but no decision made. - Advised to monitor symptoms and consider lifestyle modifications.  5. Blood blister. - Advised to monitor the blister on her foot. - Physical exam reveals a blood blister with dry top layer. - Discussed potential causes and need for dermatology referral if it does not resolve. - Referral to dermatology will be considered for potential biopsy if necessary.  6. Weight management. - Reports weight of 182.2 lbs and embarrassment about weight. - Discussed chair exercises and dietary modifications. - Encouraged to continue efforts to improve diet and exercise routine. - Monitoring of weight and lifestyle changes recommended.  Discussed evaluation, treatment and usual course. Patient verbalizes understanding and agrees with plan of care. All questions were answered. Printed information on today's pertinent diagnosis given to and discussed with patient.   The patient's medicines, past medical, family and social histories (including tobacco usage) were reviewed and updated as appropriate.   No follow-ups on file.    Chief Complaint      Patient presents with  . MEDICARE ADVANTAGE ANNUAL VISIT     History of Present Illness    History of Present Illness The  patient is a 68 year old female who presents today for a Medicare wellness visit.  She has been experiencing an increase in fever blisters over the past few months,  which she attributes to stress. She is seeking a refill of her Valtrex  prescription, even though she does not use it frequently.  She reports feeling overwhelmed by her responsibilities, including caring for her family and pets. She enjoys outdoor activities such as gardening and has spent significant time doing yard work this summer, despite experiencing discomfort from her rheumatoid arthritis. She finds solace in spending time with her pets in her gazebo and maintains regular contact with her best friend in [place].   She is currently on methotrexate for rheumatoid arthritis and has a follow-up appointment with rheumatology scheduled for 04/2024. She is also taking hydroxyzine  for anxiety management and gabapentin , which she suspects may be contributing to her fatigue.  She has noticed a blister on her foot, which she believes may have been caused by her footwear during yard work. The blister has since ruptured and appears to be a blood blister.  Her current weight is 182.2 pounds, and she plans to incorporate chair exercises into her routine and improve her diet.  FAMILY HISTORY Her father had thyroid issues and took thyroid medicine.   Home Medications   Outpatient Medications Marked as Taking for the 03/31/24 encounter (Office Visit) with Jodelle Rosina CROME, PA  Medication Sig Dispense Refill  . CHOLECALCIFEROL, VITAMIN D3, PO Take 5,000 Units by Mouth Once a Day.    . citalopram  (CELEXA ) 20 mg PO TABS TAKE 1 TABLET EVERY DAY 90 Tab 3  . diclofenac  DR (VOLTAREN ) 75 mg PO TBEC TAKE 1 TABLET TWICE DAILY 120 Tab 5  . fluticasone  propionate (FLONASE ) 50 mcg/actuation NA SpSn fluticasone  propionate 50 mcg/actuation nasal spray,suspension    . folic acid (FOLVITE) 1 mg PO TABS Take 1 Tab by Mouth Once a Day. 90 Tab 3  . gabapentin  (NEURONTIN ) 300 mg PO CAPS Take 1 Cap by Mouth nightly as needed. 90 Cap 0  . hydroCHLOROthiazide  (HYDRODIURIL ) 12.5 mg PO CAPS TAKE 1 CAPSULE EVERY DAY 90 Cap 3   . hydrOXYzine  (ATARAX ) 10 mg PO TABS TAKE 1 TABLET AT BEDTIME 90 Tab 3  . meclizine  (ANTIVERT ) 12.5 mg PO TABS Take 1 Tab by Mouth Every 8 Hours As Needed. 30 Tab 0  . methotrexate 2.5 mg PO TABS Take 6 Tabs by Mouth Every 7 Days. 48 Tab 3  . ondansetron  (ZOFRAN ) 4 mg PO TABS     . simvastatin  (ZOCOR ) 20 mg PO TABS TAKE 1 TABLET AT BEDTIME 90 Tab 3  . tiZANidine  (ZANAFLEX ) 4 mg PO TABS TAKE 1 TABLET AT BEDTIME 90 Tab 3  . valACYclovir  (VALTREX ) 500 mg PO TABS Take 1 tab by mouth twice daily for up to 7 days at first sign of cold sore  Indications: a cold sore 90 Tab 0    Past Histories   The patient's medical, family, and social history (including tobacco usage) were reviewed and updated as appropriate.  Tobacco History reviewed: Social History   Tobacco Use  Smoking Status Every Day  . Current packs/day: 0.25  . Average packs/day: 0.3 packs/day for 7.4 years (1.9 ttl pk-yrs)  . Types: Cigarettes  . Start date: 2018  . Passive exposure: Current  Smokeless Tobacco Never    Ready to quit: Yes Counseling given: Yes   Physical Exam  Vital Signs: BP 126/80   Pulse 71   Ht 5' 3 (  1.6 m)   LMP 10/23/2011   SpO2 97%   BMI 32.59 kg/m    Physical Exam Vitals reviewed.  Constitutional:      Appearance: Normal appearance.  HENT:     Head: Normocephalic.  Eyes:     Conjunctiva/sclera: Conjunctivae normal.     Pupils: Pupils are equal, round, and reactive to light.  Cardiovascular:     Rate and Rhythm: Normal rate and regular rhythm.  Pulmonary:     Effort: Pulmonary effort is normal.     Breath sounds: Normal breath sounds.  Skin:    General: Skin is warm and dry.  Neurological:     General: No focal deficit present.     Mental Status: She is alert and oriented to person, place, and time.     Gait: Gait normal.  Psychiatric:        Mood and Affect: Mood normal.     Recent Results & Studies  I have reviewed pertinent labs. Recent Labs: Results for orders placed or  performed in visit on 03/27/24  TSH  Result Value Ref Range   TSH 4.21 (H) 0.27 - 4.20 mcU/mL  Comprehensive Metabolic Panel  Result Value Ref Range   Potassium 4.2 3.5 - 5.5 mmol/L   Sodium 141 133 - 145 mmol/L   Chloride 102 98 - 110 mmol/L   Glucose 98 70 - 99 mg/dL   Calcium 9.4 8.4 - 89.4 mg/dL   Albumin 4.3 3.5 - 5.0 g/dL   SGPT (ALT) 16 5 - 40 U/L   SGOT (AST) 22 10 - 37 U/L   Bilirubin Total 0.3 0.2 - 1.2 mg/dL   Alkaline Phosphatase 86 40 - 120 U/L   BUN 18 6 - 22 mg/dL   CO2 26 20 - 32 mmol/L   Creatinine 0.7 (L) 0.8 - 1.4 mg/dL   eGFR >09.9 >39.9 fO/fpw/8.26 sq.m.   Globulin 2.1 2.0 - 4.0 g/dL   A/G Ratio 2.0 1.1 - 2.6 ratio   Total Protein 6.4 6.2 - 8.1 g/dL   Anion Gap 86.9 3.0 - 15.0 mmol/L  Lipid Complete Panel  Result Value Ref Range   Cholesterol 173 110 - 200 mg/dL   Triglyceride 882 40 - 149 mg/dL   HDL 56 >=59 mg/dL   Cholesterol/HDL 3.1 0.0 - 5.0   Non-HDL Cholesterol 117 <130 mg/dL   LDL CALCULATION 93 50 - 99 mg/dL   VLDL CALCULATION 23 8 - 30 mg/dL   LDL/HDL Ratio 1.7   CBC WITH DIFFERENTIAL AUTO  Result Value Ref Range   WBC 6.4 4.0 - 11.0 K/uL   RBC 4.23 3.80 - 5.20 M/uL   HGB 13.2 11.7 - 16.1 g/dL   HCT 59.3 64.8 - 51.6 %   MCV 96 80 - 99 fL   MCH 31 26 - 34 pg   MCHC 33 31 - 36 g/dL   RDW 84.4 89.9 - 84.4 %   Platelet 254 140 - 440 K/uL   MPV 10.1 9.0 - 13.0 fL   Segmented Neutrophils (Auto) 54 40 - 75 %   Lymphocytes (Auto) 36 20 - 45 %   Monocytes (Auto) 6 3 - 12 %   Eosinophils (Auto) 3 0 - 6 %   Basophils (Auto) 1 0 - 2 %   Absolute Neutrophils (Auto) 3.5 1.8 - 7.7 K/uL   Absolute Lymphocytes (Auto) 2.3 1.0 - 4.8 K/uL   Absolute Monocytes (Auto) 0.4 0.1 - 1.0 K/uL   Absolute Eosinophils (Auto) 0.2 0.0 -  0.5 K/uL   Absolute Basophils (Auto) 0.1 0.0 - 0.2 K/uL    Results Labs  - Blood counts: Normal  - Kidney function: Normal  - Cholesterol: Normal  - Creatinine: Flagged as low  - TSH: 0.01 over the normal range      Medicare Wellness Exam Rose Rose, female, medical record number [id number], DOB:[id number] presents today for her wellness visit.  Plan 1. Medicare annual wellness visit, subsequent  2. Gait difficulty Rose Rose has been screened as a fall risk.  Information was provided in the After Visit Summary, including fall prevention tips and a link to the CDC STEADI site.  3. Mixed hyperlipidemia - Taking Zocor    4. Primary hypertension - BP at goal  - No changes   5. Hx of cold sores - valACYclovir  (VALTREX ) 500 mg PO TABS; Take 1 tab by mouth twice daily for up to 7 days at first sign of cold sore  Indications: a cold sore  Dispense: 90 Tab; Refill: 0  6. Routine general medical examination Reviewed and updated preventative screenings as appropriate  Labs reviewed as available  The patient's medicines, past medical, family and social histories (including tobacco usage) were reviewed and updated as appropriate.   Assessment & Plan 1. Medicare wellness visit. - Laboratory results are within normal limits, except for a slightly elevated TSH level, which does not necessitate intervention at this time. - Increased pain and limited mobility due to rheumatoid arthritis. - Advised to monitor weight and incorporate chair exercises into her routine. - Follow-up lab test to be conducted in 6 months.  2. Fever blisters. - Reports an increase in fever blisters over the last few months, likely triggered by stress. - Increased pain and limited mobility. - Discussed potential triggers and stress management. - Prescription for Valtrex  provided.  3. Rheumatoid arthritis. - Continues to take methotrexate. - Reports exacerbation of symptoms while working in the yard. - Review of records and discussion about upcoming rheumatology visit in 04/2024. - Follow-up with rheumatology scheduled for 04/2024.  4. Fatigue. - Fatigue could be attributed to current medications, gabapentin  and hydroxyzine ,  or overall sadness. - Normal laboratory results, including blood counts, kidney function, and cholesterol. - Discussed potential increase in citalopram  dosage but no decision made. - Advised to monitor symptoms and consider lifestyle modifications.  5. Blood blister. - Advised to monitor the blister on her foot. - Physical exam reveals a blood blister with dry top layer. - Discussed potential causes and need for dermatology referral if it does not resolve. - Referral to dermatology will be considered for potential biopsy if necessary.  6. Weight management. - Reports weight of 182.2 lbs and embarrassment about weight. - Discussed chair exercises and dietary modifications. - Encouraged to continue efforts to improve diet and exercise routine. - Monitoring of weight and lifestyle changes recommended.  Discussed evaluation, treatment and usual course. Patient verbalizes understanding and agrees with plan of care. All questions were answered. Printed information on today's pertinent diagnosis given to and discussed with patient.   The patient's medicines, past medical, family and social histories (including tobacco usage) were reviewed and updated as appropriate.  Vital signs: BP 126/80   Pulse 71   Ht 5' 3 (1.6 m)   LMP 10/23/2011   SpO2 97%   BMI 32.59 kg/m   Patient's last menstrual period was 10/23/2011. OB Status: Postmenopausal Comments:   Patient Active Problem List  Diagnosis  . Anxiety  . Primary hypertension  . Mixed  hyperlipidemia  . 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 Cervical spondylosis without myelopathy  . Cervical radiculopathy  . Bilateral shoulder region arthritis  . Bilateral hand numbness  . Ossification of posterior longitudinal ligament (HCC)  . Arthritis   Past Medical History:  Diagnosis Date  . Hypertension    Immunization History  Administered Date(s) Administered  . Pneumo-20 Valent Conjugate 0.5ML IM 03/20/2023  .  Tdap 0.5 Ml Im (Adacel >85yr) Vfa Vac Inj 10/02/2022  . Triamcinolone  injection 09/04/2021, 01/24/2022   History reviewed. No pertinent family history.  Medications:  has a current medication list which includes the following prescription(s): cholecalciferol (vitamin d3), citalopram , diclofenac  dr, fluticasone  propionate, folic acid, gabapentin , hydrochlorothiazide , hydroxyzine , meclizine , methotrexate, ondansetron , simvastatin , tizanidine , and valacyclovir .  Social History:  reports that she has been smoking cigarettes. She started smoking about 7 years ago. She has a 1.9 pack-year smoking history. She has been exposed to tobacco smoke. She has never used smokeless tobacco. She reports that she does not currently use alcohol. She reports current drug use. Frequency: 2.00 times per week. Drug: Marijuana., Ready to quit: Yes Counseling given: Yes    Basic Health Risk Assessment:  What is your highest level of education completd?: (Patient-Rptd) High School diploma or Equivalency (GED) In general how would you say your health is?: (Patient-Rptd) Fair How does your health compare to a year ago?: (Patient-Rptd) Same Do you need assistance with any of the following?: (Patient-Rptd) none Do you have a caregiver or someone designated to help you at home?: (Patient-Rptd) None Do you feel your home environment is safe?: (Patient-Rptd) Yes Do you use any equipment to help you in your day to day tasks for mobility?: (Patient-Rptd) No   Do you have significant problems with memory loss or confusion or has a family member expressed concerns about this?: (Patient-Rptd) No Do you exercise for 20 minutes three or more days a week?: (!) (Patient-Rptd) No Do you eat fresh fruits and vegetables?: (Patient-Rptd) Yes Do you follow a special diet?: (!) (Patient-Rptd) Weight Control Do you have difficulty with the following?: (!) (Patient-Rptd) Pain     Are you currently seeing a behavioral healthcare  provider?: (Patient-Rptd) No     The Healthy Change that I would like to make is: (check all that apply): (!) (Patient-Rptd) Lose Weight, Stop/Decrease Smoking, Start/Increase Exercising Have you used an opioid containing pain medication (Percocet, Hydrocodone , Oxycontin, Morphine, Tramadol , etc.) in the last 6 months?: (Patient-Rptd) No Is pain currently interfering with your daily activities?: (Patient-Rptd) No             Safety/Fall Risk Screening (STEADI): Have you fallen in the past year?: (!) (Patient-Rptd) Yes Have you fallen more than once in the past year?: (Patient-Rptd) No Did any of your falls result in an injury?: (Patient-Rptd) No Do you take any medications that make you feel dizzy or unsteady on your feet?: (Patient-Rptd) No Do you feel unsteady when standing or walking?: (!) (Patient-Rptd) Yes Are you worried about falling?: (Patient-Rptd) No  Cognitive Screening (if applicable):   (normal is 4 or 5)  Advance Care Planning (if applicable): Do you have a living will/advance directive?: (Patient-Rptd) Yes    Vision Examination: (required by IPPE only) No results found.  Post-Menopausal Women's Assessment Has it been more than 12 months since you have been without a period?: (Patient-Rptd) Yes Have you experienced vaginal bleeding or discharge within the last 6 months?: (Patient-Rptd) No Have you experienced any pelvic pressure, bloating or pain  in the past 6 months?: (Patient-Rptd) No  Preventative Care Review (required): The patient's preventative care was reviewed? Yes  Health Maintenance  Topic Date Due  . SHINGLES VACCINE (Shingrix) (1 of 2) Never done  . OSTEOPOROSIS SCREENING  Never done  . COVID-19 VACCINE (3 - 2024-25 season) 06/23/2023  . ANNUAL MED ADVANTAGE WELLNESS VISIT (MA)  10/23/2023  . FLU VACCINE  05/22/2024  . MAMMOGRAM/BREAST CANCER SCREENING  09/10/2024  . LIPID SCREENING  03/27/2025  . CREATININE (KIDNEY FUNCTION TEST)  03/27/2025   . DEPRESSION SCREENING  03/30/2025  . COLORECTAL CANCER SCREENING  07/24/2026  . DIABETES SCREENING  03/28/2027  . RESPIRATORY SYNCTIAL VIRUS (RSV) SCDM 60+ OR PREGNANT (1 - 1-dose 75+ series) 07/10/2032  . TD VACCINE  10/02/2032  . TDAP VACCINE  Completed  . HEP-C SCREENING  Completed  . PNEUMOCOCCAL VACCINE: 50+  Completed  . HPV  Aged Out  . PHYSICAL/EXAM  Discontinued    I have reviewed information entered by the clinical staff and/or patient and verified it as accurate or edited where necessary.  Current list of Health Care Providers: Patient Care Team: Jodelle Rosina CROME, [place] as PCP - General [facility])  PPPS/Care Plan was reviewed and a copy given to the patient.  I have reviewed information entered by the clinical staff and/or patient and verified it as accurate or edited where necessary.  Signature: Rosina CROME Jodelle, [place] [facility] Dept: [phone] Dept Fax: [phone]
